# Patient Record
Sex: Male | Born: 1935 | Race: White | Hispanic: No | Marital: Married | Smoking: Never smoker
Health system: Southern US, Community
[De-identification: ages and names within clinical notes are randomized; demographics above are authoritative.]

## PROBLEM LIST (undated history)

## (undated) DIAGNOSIS — E349 Endocrine disorder, unspecified: Secondary | ICD-10-CM

## (undated) DIAGNOSIS — I495 Sick sinus syndrome: Secondary | ICD-10-CM

## (undated) DIAGNOSIS — I2510 Atherosclerotic heart disease of native coronary artery without angina pectoris: Secondary | ICD-10-CM

## (undated) DIAGNOSIS — I10 Essential (primary) hypertension: Secondary | ICD-10-CM

## (undated) DIAGNOSIS — I480 Paroxysmal atrial fibrillation: Secondary | ICD-10-CM

## (undated) DIAGNOSIS — N529 Male erectile dysfunction, unspecified: Secondary | ICD-10-CM

## (undated) DIAGNOSIS — M1990 Unspecified osteoarthritis, unspecified site: Secondary | ICD-10-CM

## (undated) DIAGNOSIS — E785 Hyperlipidemia, unspecified: Secondary | ICD-10-CM

## (undated) DIAGNOSIS — M545 Low back pain: Secondary | ICD-10-CM

## (undated) DIAGNOSIS — N200 Calculus of kidney: Secondary | ICD-10-CM

## (undated) DIAGNOSIS — Z8711 Personal history of peptic ulcer disease: Secondary | ICD-10-CM

## (undated) DIAGNOSIS — Z8719 Personal history of other diseases of the digestive system: Secondary | ICD-10-CM

## (undated) DIAGNOSIS — N400 Enlarged prostate without lower urinary tract symptoms: Secondary | ICD-10-CM

## (undated) DIAGNOSIS — G8929 Other chronic pain: Secondary | ICD-10-CM

## (undated) DIAGNOSIS — E669 Obesity, unspecified: Secondary | ICD-10-CM

## (undated) DIAGNOSIS — I4891 Unspecified atrial fibrillation: Secondary | ICD-10-CM

## (undated) DIAGNOSIS — K5730 Diverticulosis of large intestine without perforation or abscess without bleeding: Secondary | ICD-10-CM

## (undated) HISTORY — DX: Paroxysmal atrial fibrillation: I48.0

## (undated) HISTORY — DX: Endocrine disorder, unspecified: E34.9

## (undated) HISTORY — DX: Sick sinus syndrome: I49.5

## (undated) HISTORY — DX: Atherosclerotic heart disease of native coronary artery without angina pectoris: I25.10

## (undated) HISTORY — DX: Hyperlipidemia, unspecified: E78.5

## (undated) HISTORY — PX: CHOLECYSTECTOMY: SHX55

## (undated) HISTORY — PX: CATARACT EXTRACTION, BILATERAL: SHX1313

## (undated) HISTORY — DX: Obesity, unspecified: E66.9

## (undated) HISTORY — DX: Unspecified osteoarthritis, unspecified site: M19.90

## (undated) HISTORY — DX: Personal history of other diseases of the digestive system: Z87.19

## (undated) HISTORY — DX: Diverticulosis of large intestine without perforation or abscess without bleeding: K57.30

## (undated) HISTORY — DX: Personal history of peptic ulcer disease: Z87.11

## (undated) HISTORY — PX: OTHER SURGICAL HISTORY: SHX169

## (undated) HISTORY — DX: Male erectile dysfunction, unspecified: N52.9

## (undated) HISTORY — DX: Essential (primary) hypertension: I10

## (undated) HISTORY — PX: CARDIAC CATHETERIZATION: SHX172

## (undated) HISTORY — DX: Benign prostatic hyperplasia without lower urinary tract symptoms: N40.0

---

## 1997-04-15 HISTORY — PX: OTHER SURGICAL HISTORY: SHX169

## 1997-04-15 HISTORY — PX: COLONOSCOPY: SHX174

## 1997-10-19 ENCOUNTER — Encounter: Payer: Self-pay | Admitting: Gastroenterology

## 1997-10-19 ENCOUNTER — Inpatient Hospital Stay (HOSPITAL_COMMUNITY): Admission: EM | Admit: 1997-10-19 | Discharge: 1997-10-21 | Payer: Self-pay | Admitting: Gastroenterology

## 1997-10-20 ENCOUNTER — Encounter: Payer: Self-pay | Admitting: Gastroenterology

## 1997-10-21 ENCOUNTER — Encounter: Payer: Self-pay | Admitting: Internal Medicine

## 1997-10-21 ENCOUNTER — Encounter: Payer: Self-pay | Admitting: Gastroenterology

## 2003-09-05 ENCOUNTER — Encounter: Payer: Self-pay | Admitting: Internal Medicine

## 2005-02-01 ENCOUNTER — Ambulatory Visit: Payer: Self-pay | Admitting: Internal Medicine

## 2005-07-29 ENCOUNTER — Ambulatory Visit: Payer: Self-pay | Admitting: Internal Medicine

## 2005-08-05 ENCOUNTER — Ambulatory Visit: Payer: Self-pay | Admitting: Internal Medicine

## 2005-11-04 ENCOUNTER — Ambulatory Visit: Payer: Self-pay | Admitting: Internal Medicine

## 2006-04-15 HISTORY — PX: INSERT / REPLACE / REMOVE PACEMAKER: SUR710

## 2006-08-18 ENCOUNTER — Ambulatory Visit: Payer: Self-pay | Admitting: Internal Medicine

## 2006-11-11 DIAGNOSIS — M1990 Unspecified osteoarthritis, unspecified site: Secondary | ICD-10-CM | POA: Insufficient documentation

## 2006-11-11 DIAGNOSIS — N400 Enlarged prostate without lower urinary tract symptoms: Secondary | ICD-10-CM

## 2006-11-11 DIAGNOSIS — K279 Peptic ulcer, site unspecified, unspecified as acute or chronic, without hemorrhage or perforation: Secondary | ICD-10-CM | POA: Insufficient documentation

## 2006-11-11 DIAGNOSIS — I10 Essential (primary) hypertension: Secondary | ICD-10-CM | POA: Insufficient documentation

## 2006-11-11 DIAGNOSIS — K5730 Diverticulosis of large intestine without perforation or abscess without bleeding: Secondary | ICD-10-CM | POA: Insufficient documentation

## 2006-11-11 DIAGNOSIS — Z87442 Personal history of urinary calculi: Secondary | ICD-10-CM

## 2006-11-24 ENCOUNTER — Ambulatory Visit: Payer: Self-pay | Admitting: Internal Medicine

## 2006-11-24 DIAGNOSIS — R634 Abnormal weight loss: Secondary | ICD-10-CM | POA: Insufficient documentation

## 2006-11-24 DIAGNOSIS — K759 Inflammatory liver disease, unspecified: Secondary | ICD-10-CM | POA: Insufficient documentation

## 2006-11-24 LAB — CONVERTED CEMR LAB
Basophils Relative: 0.1 % (ref 0.0–1.0)
Bilirubin, Direct: 0.2 mg/dL (ref 0.0–0.3)
CO2: 29 meq/L (ref 19–32)
Creatinine, Ser: 1.5 mg/dL (ref 0.4–1.5)
HCT: 38.8 % — ABNORMAL LOW (ref 39.0–52.0)
Hemoglobin: 13.3 g/dL (ref 13.0–17.0)
Lymphocytes Relative: 12.1 % (ref 12.0–46.0)
MCHC: 34.2 g/dL (ref 30.0–36.0)
Monocytes Absolute: 1.5 10*3/uL — ABNORMAL HIGH (ref 0.2–0.7)
Neutrophils Relative %: 66.6 % (ref 43.0–77.0)
Potassium: 3.1 meq/L — ABNORMAL LOW (ref 3.5–5.1)
RDW: 13.1 % (ref 11.5–14.6)
Sodium: 137 meq/L (ref 135–145)
TSH: 0.83 microintl units/mL (ref 0.35–5.50)
Total Bilirubin: 0.9 mg/dL (ref 0.3–1.2)
Total Protein: 7.3 g/dL (ref 6.0–8.3)

## 2006-11-27 ENCOUNTER — Ambulatory Visit: Payer: Self-pay | Admitting: Internal Medicine

## 2006-11-28 ENCOUNTER — Ambulatory Visit: Payer: Self-pay | Admitting: Internal Medicine

## 2006-11-28 ENCOUNTER — Encounter: Payer: Self-pay | Admitting: Internal Medicine

## 2006-11-28 ENCOUNTER — Ambulatory Visit: Payer: Self-pay | Admitting: Cardiology

## 2006-11-28 ENCOUNTER — Inpatient Hospital Stay (HOSPITAL_COMMUNITY): Admission: AD | Admit: 2006-11-28 | Discharge: 2006-12-04 | Payer: Self-pay | Admitting: Internal Medicine

## 2006-11-29 ENCOUNTER — Encounter (INDEPENDENT_AMBULATORY_CARE_PROVIDER_SITE_OTHER): Payer: Self-pay | Admitting: General Surgery

## 2006-11-29 ENCOUNTER — Encounter (INDEPENDENT_AMBULATORY_CARE_PROVIDER_SITE_OTHER): Payer: Self-pay | Admitting: *Deleted

## 2006-12-01 ENCOUNTER — Encounter: Payer: Self-pay | Admitting: Internal Medicine

## 2006-12-02 ENCOUNTER — Encounter: Payer: Self-pay | Admitting: Internal Medicine

## 2006-12-08 ENCOUNTER — Ambulatory Visit: Payer: Self-pay | Admitting: Cardiology

## 2006-12-10 ENCOUNTER — Ambulatory Visit: Payer: Self-pay | Admitting: Internal Medicine

## 2006-12-16 ENCOUNTER — Ambulatory Visit: Payer: Self-pay

## 2006-12-16 ENCOUNTER — Ambulatory Visit: Payer: Self-pay | Admitting: Internal Medicine

## 2006-12-22 ENCOUNTER — Ambulatory Visit: Payer: Self-pay | Admitting: Cardiology

## 2006-12-22 ENCOUNTER — Ambulatory Visit: Payer: Self-pay | Admitting: Internal Medicine

## 2006-12-22 LAB — CONVERTED CEMR LAB
BUN: 14 mg/dL (ref 6–23)
Basophils Absolute: 0 10*3/uL (ref 0.0–0.1)
Creatinine, Ser: 1.2 mg/dL (ref 0.4–1.5)
Hemoglobin: 13.3 g/dL (ref 13.0–17.0)
INR: 1.2 — ABNORMAL HIGH (ref 0.8–1.0)
MCHC: 33.7 g/dL (ref 30.0–36.0)
Monocytes Absolute: 0.7 10*3/uL (ref 0.2–0.7)
Monocytes Relative: 11.3 % — ABNORMAL HIGH (ref 3.0–11.0)
Potassium: 3.8 meq/L (ref 3.5–5.1)
RBC: 4.1 M/uL — ABNORMAL LOW (ref 4.22–5.81)
RDW: 13.4 % (ref 11.5–14.6)
aPTT: 24.9 s (ref 21.7–29.8)

## 2006-12-23 ENCOUNTER — Ambulatory Visit: Payer: Self-pay | Admitting: Internal Medicine

## 2006-12-25 ENCOUNTER — Ambulatory Visit: Payer: Self-pay | Admitting: Internal Medicine

## 2006-12-25 ENCOUNTER — Inpatient Hospital Stay (HOSPITAL_BASED_OUTPATIENT_CLINIC_OR_DEPARTMENT_OTHER): Admission: RE | Admit: 2006-12-25 | Discharge: 2006-12-25 | Payer: Self-pay | Admitting: Internal Medicine

## 2006-12-29 ENCOUNTER — Encounter: Payer: Self-pay | Admitting: Internal Medicine

## 2006-12-29 ENCOUNTER — Ambulatory Visit: Payer: Self-pay | Admitting: Internal Medicine

## 2007-01-01 ENCOUNTER — Ambulatory Visit: Payer: Self-pay | Admitting: Internal Medicine

## 2007-01-05 ENCOUNTER — Ambulatory Visit: Payer: Self-pay | Admitting: Internal Medicine

## 2007-01-14 ENCOUNTER — Ambulatory Visit: Payer: Self-pay | Admitting: Cardiology

## 2007-01-28 ENCOUNTER — Ambulatory Visit: Payer: Self-pay | Admitting: Cardiovascular Disease

## 2007-02-05 ENCOUNTER — Inpatient Hospital Stay (HOSPITAL_COMMUNITY): Admission: EM | Admit: 2007-02-05 | Discharge: 2007-02-06 | Payer: Self-pay | Admitting: Emergency Medicine

## 2007-02-05 ENCOUNTER — Ambulatory Visit: Payer: Self-pay | Admitting: Internal Medicine

## 2007-02-10 ENCOUNTER — Ambulatory Visit: Payer: Self-pay | Admitting: Internal Medicine

## 2007-02-10 ENCOUNTER — Ambulatory Visit: Admission: RE | Admit: 2007-02-10 | Discharge: 2007-02-10 | Payer: Self-pay | Admitting: Internal Medicine

## 2007-02-12 ENCOUNTER — Observation Stay (HOSPITAL_COMMUNITY): Admission: RE | Admit: 2007-02-12 | Discharge: 2007-02-13 | Payer: Self-pay | Admitting: Internal Medicine

## 2007-02-12 ENCOUNTER — Ambulatory Visit: Payer: Self-pay | Admitting: Cardiology

## 2007-02-13 ENCOUNTER — Encounter: Payer: Self-pay | Admitting: Internal Medicine

## 2007-02-18 ENCOUNTER — Encounter: Payer: Self-pay | Admitting: Internal Medicine

## 2007-02-18 ENCOUNTER — Ambulatory Visit: Payer: Self-pay

## 2007-02-25 ENCOUNTER — Ambulatory Visit: Payer: Self-pay

## 2007-02-25 ENCOUNTER — Ambulatory Visit: Payer: Self-pay | Admitting: Cardiology

## 2007-03-17 ENCOUNTER — Ambulatory Visit: Payer: Self-pay | Admitting: Cardiology

## 2007-03-17 ENCOUNTER — Ambulatory Visit: Payer: Self-pay | Admitting: Internal Medicine

## 2007-03-31 ENCOUNTER — Ambulatory Visit: Payer: Self-pay | Admitting: Internal Medicine

## 2007-03-31 LAB — CONVERTED CEMR LAB
ALT: 16 units/L (ref 0–53)
AST: 27 units/L (ref 0–37)
Bilirubin, Direct: 0.1 mg/dL (ref 0.0–0.3)
CO2: 24 meq/L (ref 19–32)
Calcium: 9.3 mg/dL (ref 8.4–10.5)
Chloride: 108 meq/L (ref 96–112)
Cholesterol: 125 mg/dL (ref 0–200)
Creatinine, Ser: 1.3 mg/dL (ref 0.4–1.5)
GFR calc Af Amer: 69 mL/min
Glucose, Bld: 95 mg/dL (ref 70–99)
Total Bilirubin: 0.9 mg/dL (ref 0.3–1.2)
Total CHOL/HDL Ratio: 2.5
Total Protein: 7 g/dL (ref 6.0–8.3)

## 2007-04-13 ENCOUNTER — Ambulatory Visit: Payer: Self-pay | Admitting: Cardiology

## 2007-05-04 ENCOUNTER — Ambulatory Visit: Payer: Self-pay | Admitting: Cardiovascular Disease

## 2007-06-09 ENCOUNTER — Ambulatory Visit: Payer: Self-pay | Admitting: Cardiology

## 2007-06-09 ENCOUNTER — Ambulatory Visit: Payer: Self-pay | Admitting: Internal Medicine

## 2007-06-29 ENCOUNTER — Ambulatory Visit: Payer: Self-pay | Admitting: Internal Medicine

## 2007-06-29 DIAGNOSIS — I2510 Atherosclerotic heart disease of native coronary artery without angina pectoris: Secondary | ICD-10-CM | POA: Insufficient documentation

## 2007-06-29 DIAGNOSIS — N529 Male erectile dysfunction, unspecified: Secondary | ICD-10-CM | POA: Insufficient documentation

## 2007-07-07 ENCOUNTER — Ambulatory Visit: Payer: Self-pay | Admitting: Cardiovascular Disease

## 2007-07-17 DIAGNOSIS — E785 Hyperlipidemia, unspecified: Secondary | ICD-10-CM

## 2007-07-17 DIAGNOSIS — I4819 Other persistent atrial fibrillation: Secondary | ICD-10-CM | POA: Insufficient documentation

## 2007-07-30 ENCOUNTER — Ambulatory Visit: Payer: Self-pay | Admitting: Internal Medicine

## 2007-08-04 ENCOUNTER — Ambulatory Visit: Payer: Self-pay | Admitting: Cardiovascular Disease

## 2007-09-03 ENCOUNTER — Ambulatory Visit: Payer: Self-pay | Admitting: Internal Medicine

## 2007-10-01 ENCOUNTER — Ambulatory Visit: Payer: Self-pay | Admitting: Cardiology

## 2007-10-29 ENCOUNTER — Ambulatory Visit: Payer: Self-pay | Admitting: Cardiovascular Disease

## 2007-11-19 ENCOUNTER — Ambulatory Visit: Payer: Self-pay | Admitting: Internal Medicine

## 2007-11-26 ENCOUNTER — Ambulatory Visit: Payer: Self-pay | Admitting: Internal Medicine

## 2007-12-31 ENCOUNTER — Ambulatory Visit: Payer: Self-pay | Admitting: Cardiology

## 2008-02-02 ENCOUNTER — Ambulatory Visit: Payer: Self-pay | Admitting: Internal Medicine

## 2008-02-02 ENCOUNTER — Ambulatory Visit: Payer: Self-pay | Admitting: Cardiology

## 2008-03-01 ENCOUNTER — Ambulatory Visit: Payer: Self-pay | Admitting: Cardiovascular Disease

## 2008-03-07 ENCOUNTER — Ambulatory Visit: Payer: Self-pay | Admitting: Internal Medicine

## 2008-03-07 DIAGNOSIS — F528 Other sexual dysfunction not due to a substance or known physiological condition: Secondary | ICD-10-CM

## 2008-03-15 ENCOUNTER — Ambulatory Visit: Payer: Self-pay | Admitting: Internal Medicine

## 2008-03-15 ENCOUNTER — Ambulatory Visit: Payer: Self-pay | Admitting: Cardiology

## 2008-04-19 ENCOUNTER — Ambulatory Visit: Payer: Self-pay | Admitting: Internal Medicine

## 2008-05-03 ENCOUNTER — Ambulatory Visit: Payer: Self-pay | Admitting: Cardiology

## 2008-06-03 ENCOUNTER — Encounter: Payer: Self-pay | Admitting: Internal Medicine

## 2008-06-03 ENCOUNTER — Ambulatory Visit: Payer: Self-pay | Admitting: Internal Medicine

## 2008-06-03 DIAGNOSIS — M79609 Pain in unspecified limb: Secondary | ICD-10-CM

## 2008-06-03 DIAGNOSIS — R0989 Other specified symptoms and signs involving the circulatory and respiratory systems: Secondary | ICD-10-CM

## 2008-06-03 DIAGNOSIS — R0602 Shortness of breath: Secondary | ICD-10-CM

## 2008-06-08 ENCOUNTER — Ambulatory Visit: Payer: Self-pay

## 2008-06-08 ENCOUNTER — Encounter: Payer: Self-pay | Admitting: Internal Medicine

## 2008-06-15 ENCOUNTER — Ambulatory Visit: Payer: Self-pay | Admitting: Internal Medicine

## 2008-06-24 ENCOUNTER — Ambulatory Visit: Payer: Self-pay | Admitting: Cardiovascular Disease

## 2008-07-22 ENCOUNTER — Ambulatory Visit: Payer: Self-pay | Admitting: Cardiology

## 2008-08-04 ENCOUNTER — Encounter (INDEPENDENT_AMBULATORY_CARE_PROVIDER_SITE_OTHER): Payer: Self-pay | Admitting: *Deleted

## 2008-08-19 ENCOUNTER — Ambulatory Visit: Payer: Self-pay | Admitting: Cardiovascular Disease

## 2008-09-06 ENCOUNTER — Ambulatory Visit: Payer: Self-pay | Admitting: Internal Medicine

## 2008-09-06 LAB — CONVERTED CEMR LAB
ALT: 21 units/L (ref 0–53)
Alkaline Phosphatase: 49 units/L (ref 39–117)
Basophils Relative: 0.1 % (ref 0.0–3.0)
Bilirubin, Direct: 0.1 mg/dL (ref 0.0–0.3)
Calcium: 9.2 mg/dL (ref 8.4–10.5)
Creatinine, Ser: 1.3 mg/dL (ref 0.4–1.5)
Eosinophils Relative: 2.6 % (ref 0.0–5.0)
GFR calc non Af Amer: 56.97 mL/min (ref >60)
Lymphocytes Relative: 18 % (ref 12.0–46.0)
Neutrophils Relative %: 68.6 % (ref 43.0–77.0)
PSA: 2.26 ng/mL (ref 0.10–4.00)
Platelets: 206 10*3/uL (ref 150.0–400.0)
RBC: 3.98 M/uL — ABNORMAL LOW (ref 4.22–5.81)
Total Bilirubin: 0.7 mg/dL (ref 0.3–1.2)
Total Protein: 7 g/dL (ref 6.0–8.3)
WBC: 7.1 10*3/uL (ref 4.5–10.5)

## 2008-09-09 ENCOUNTER — Ambulatory Visit: Payer: Self-pay | Admitting: Internal Medicine

## 2008-09-09 LAB — CONVERTED CEMR LAB: POC INR: 2.9

## 2008-09-13 ENCOUNTER — Encounter: Payer: Self-pay | Admitting: *Deleted

## 2008-09-14 ENCOUNTER — Ambulatory Visit: Payer: Self-pay | Admitting: Internal Medicine

## 2008-10-07 ENCOUNTER — Ambulatory Visit: Payer: Self-pay | Admitting: Cardiology

## 2008-10-07 LAB — CONVERTED CEMR LAB
POC INR: 2.9
Prothrombin Time: 20.5 s

## 2008-10-19 ENCOUNTER — Encounter: Payer: Self-pay | Admitting: *Deleted

## 2008-11-03 ENCOUNTER — Ambulatory Visit: Payer: Self-pay | Admitting: Cardiology

## 2008-12-01 ENCOUNTER — Ambulatory Visit: Payer: Self-pay | Admitting: Cardiology

## 2008-12-01 LAB — CONVERTED CEMR LAB: POC INR: 3.2

## 2008-12-14 ENCOUNTER — Ambulatory Visit: Payer: Self-pay | Admitting: Internal Medicine

## 2008-12-22 ENCOUNTER — Ambulatory Visit: Payer: Self-pay | Admitting: Cardiovascular Disease

## 2009-01-05 ENCOUNTER — Ambulatory Visit: Payer: Self-pay | Admitting: Internal Medicine

## 2009-01-19 ENCOUNTER — Ambulatory Visit: Payer: Self-pay | Admitting: Internal Medicine

## 2009-01-19 LAB — CONVERTED CEMR LAB: POC INR: 2.6

## 2009-02-08 ENCOUNTER — Ambulatory Visit: Payer: Self-pay | Admitting: Cardiology

## 2009-02-08 ENCOUNTER — Encounter: Payer: Self-pay | Admitting: Internal Medicine

## 2009-02-08 ENCOUNTER — Ambulatory Visit: Payer: Self-pay | Admitting: Internal Medicine

## 2009-02-08 DIAGNOSIS — Z950 Presence of cardiac pacemaker: Secondary | ICD-10-CM | POA: Insufficient documentation

## 2009-02-08 LAB — CONVERTED CEMR LAB: POC INR: 2.5

## 2009-02-20 ENCOUNTER — Ambulatory Visit: Payer: Self-pay | Admitting: Internal Medicine

## 2009-02-21 ENCOUNTER — Encounter: Payer: Self-pay | Admitting: Internal Medicine

## 2009-02-21 ENCOUNTER — Telehealth (INDEPENDENT_AMBULATORY_CARE_PROVIDER_SITE_OTHER): Payer: Self-pay

## 2009-03-08 ENCOUNTER — Ambulatory Visit: Payer: Self-pay | Admitting: Cardiology

## 2009-03-08 ENCOUNTER — Ambulatory Visit (HOSPITAL_COMMUNITY): Admission: RE | Admit: 2009-03-08 | Discharge: 2009-03-08 | Payer: Self-pay | Admitting: Internal Medicine

## 2009-03-08 ENCOUNTER — Ambulatory Visit: Payer: Self-pay

## 2009-03-08 ENCOUNTER — Encounter: Payer: Self-pay | Admitting: Internal Medicine

## 2009-03-08 ENCOUNTER — Ambulatory Visit: Payer: Self-pay | Admitting: Internal Medicine

## 2009-03-08 LAB — CONVERTED CEMR LAB: POC INR: 2.7

## 2009-03-23 ENCOUNTER — Ambulatory Visit: Payer: Self-pay | Admitting: Internal Medicine

## 2009-03-23 DIAGNOSIS — R5381 Other malaise: Secondary | ICD-10-CM

## 2009-03-23 DIAGNOSIS — R5383 Other fatigue: Secondary | ICD-10-CM

## 2009-04-05 ENCOUNTER — Ambulatory Visit: Payer: Self-pay | Admitting: Internal Medicine

## 2009-04-05 DIAGNOSIS — I2510 Atherosclerotic heart disease of native coronary artery without angina pectoris: Secondary | ICD-10-CM

## 2009-04-05 LAB — CONVERTED CEMR LAB: POC INR: 2.5

## 2009-04-06 ENCOUNTER — Encounter: Payer: Self-pay | Admitting: Internal Medicine

## 2009-04-06 LAB — CONVERTED CEMR LAB
ALT: 18 units/L (ref 0–53)
AST: 29 units/L (ref 0–37)
Albumin: 3.9 g/dL (ref 3.5–5.2)
Alkaline Phosphatase: 51 units/L (ref 39–117)
Basophils Absolute: 0 10*3/uL (ref 0.0–0.1)
CO2: 26 meq/L (ref 19–32)
Calcium: 9.2 mg/dL (ref 8.4–10.5)
Cholesterol: 139 mg/dL (ref 0–200)
Creatinine, Ser: 1.4 mg/dL (ref 0.4–1.5)
Eosinophils Relative: 1.7 % (ref 0.0–5.0)
GFR calc non Af Amer: 52.22 mL/min (ref >60)
HCT: 44.7 % (ref 39.0–52.0)
HDL: 43.1 mg/dL (ref >39.00)
Lymphocytes Relative: 14.4 % (ref 12.0–46.0)
Lymphs Abs: 1.2 10*3/uL (ref 0.7–4.0)
Monocytes Relative: 10.2 % (ref 3.0–12.0)
Neutrophils Relative %: 73.3 % (ref 43.0–77.0)
Platelets: 236 10*3/uL (ref 150.0–400.0)
RDW: 13.1 % (ref 11.5–14.6)
Sodium: 139 meq/L (ref 135–145)
TSH: 1.12 microintl units/mL (ref 0.35–5.50)
Total CHOL/HDL Ratio: 3
Total Protein: 7 g/dL (ref 6.0–8.3)
Triglycerides: 148 mg/dL (ref 0.0–149.0)
WBC: 8 10*3/uL (ref 4.5–10.5)

## 2009-05-03 ENCOUNTER — Ambulatory Visit: Payer: Self-pay | Admitting: Cardiovascular Disease

## 2009-05-03 LAB — CONVERTED CEMR LAB: POC INR: 2.8

## 2009-05-31 ENCOUNTER — Ambulatory Visit: Payer: Self-pay | Admitting: Internal Medicine

## 2009-06-13 ENCOUNTER — Ambulatory Visit: Payer: Self-pay | Admitting: Internal Medicine

## 2009-06-28 ENCOUNTER — Ambulatory Visit: Payer: Self-pay | Admitting: Internal Medicine

## 2009-06-28 LAB — CONVERTED CEMR LAB: POC INR: 3

## 2009-07-05 ENCOUNTER — Ambulatory Visit: Payer: Self-pay | Admitting: Radiology

## 2009-07-05 ENCOUNTER — Ambulatory Visit: Payer: Self-pay | Admitting: Cardiology

## 2009-07-05 ENCOUNTER — Encounter: Payer: Self-pay | Admitting: Emergency Medicine

## 2009-07-05 ENCOUNTER — Inpatient Hospital Stay (HOSPITAL_COMMUNITY): Admission: AD | Admit: 2009-07-05 | Discharge: 2009-07-09 | Payer: Self-pay | Admitting: Cardiology

## 2009-07-17 ENCOUNTER — Encounter: Payer: Self-pay | Admitting: Internal Medicine

## 2009-07-18 ENCOUNTER — Ambulatory Visit: Payer: Self-pay | Admitting: Internal Medicine

## 2009-07-18 LAB — CONVERTED CEMR LAB: POC INR: 3.4

## 2009-08-01 ENCOUNTER — Ambulatory Visit: Payer: Self-pay | Admitting: Cardiovascular Disease

## 2009-08-09 ENCOUNTER — Ambulatory Visit: Payer: Self-pay | Admitting: Internal Medicine

## 2009-08-09 DIAGNOSIS — I498 Other specified cardiac arrhythmias: Secondary | ICD-10-CM

## 2009-08-22 ENCOUNTER — Ambulatory Visit: Payer: Self-pay | Admitting: Internal Medicine

## 2009-08-23 ENCOUNTER — Ambulatory Visit: Payer: Self-pay | Admitting: Internal Medicine

## 2009-09-12 ENCOUNTER — Ambulatory Visit: Payer: Self-pay | Admitting: Internal Medicine

## 2009-09-13 ENCOUNTER — Encounter: Payer: Self-pay | Admitting: Internal Medicine

## 2009-09-13 ENCOUNTER — Ambulatory Visit: Payer: Self-pay | Admitting: Cardiology

## 2009-09-13 LAB — CONVERTED CEMR LAB: POC INR: 2

## 2009-09-26 ENCOUNTER — Telehealth: Payer: Self-pay

## 2009-10-11 ENCOUNTER — Ambulatory Visit: Payer: Self-pay | Admitting: Cardiology

## 2009-10-13 IMAGING — CR DG CHEST 2V
2 series · 2 of 2 positions shown · non-contrast
Comparison: Preoperative study 02/05/07.

CLINICAL DATA: 76 year old male status post pacemaker placement.
 CHEST - 2 VIEW - 02/13/07:

[w chest pa]
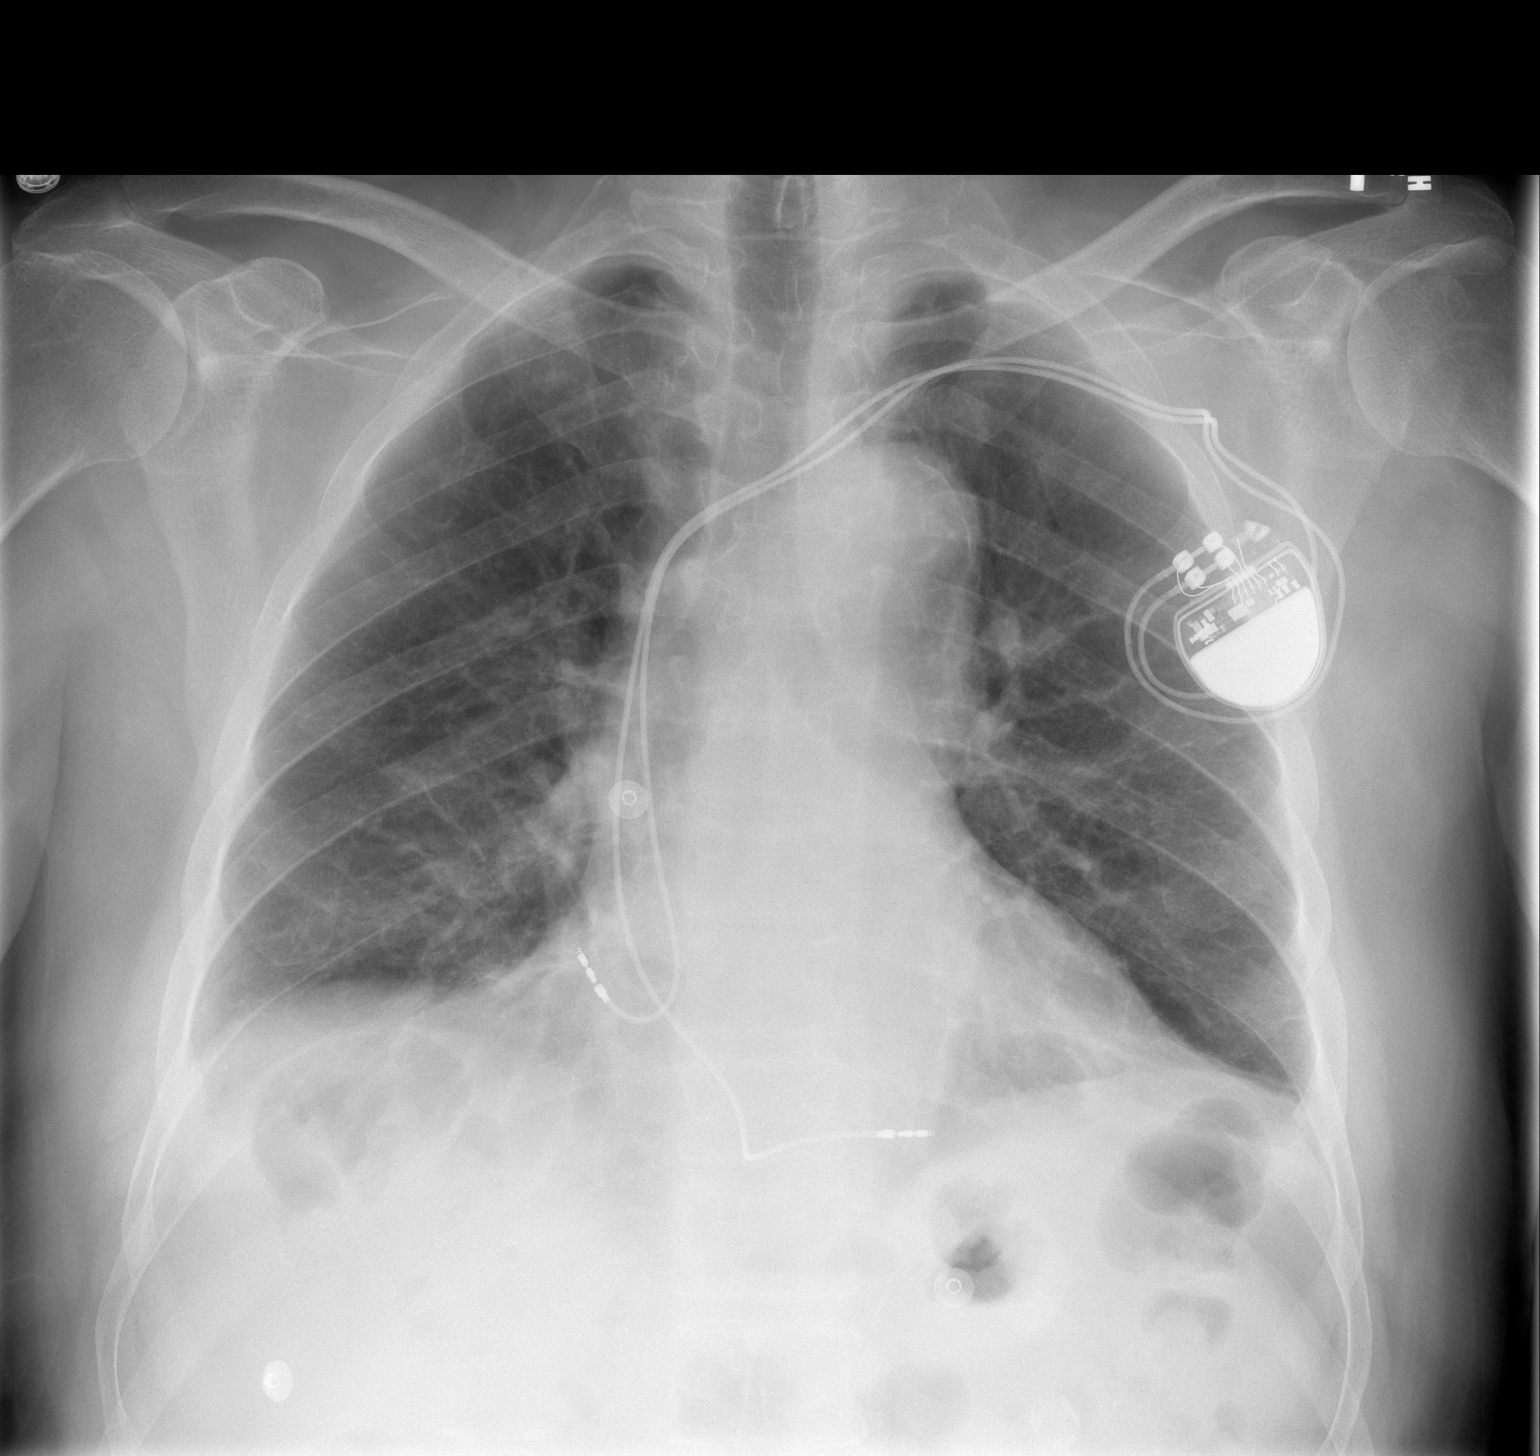

[w chest lat]
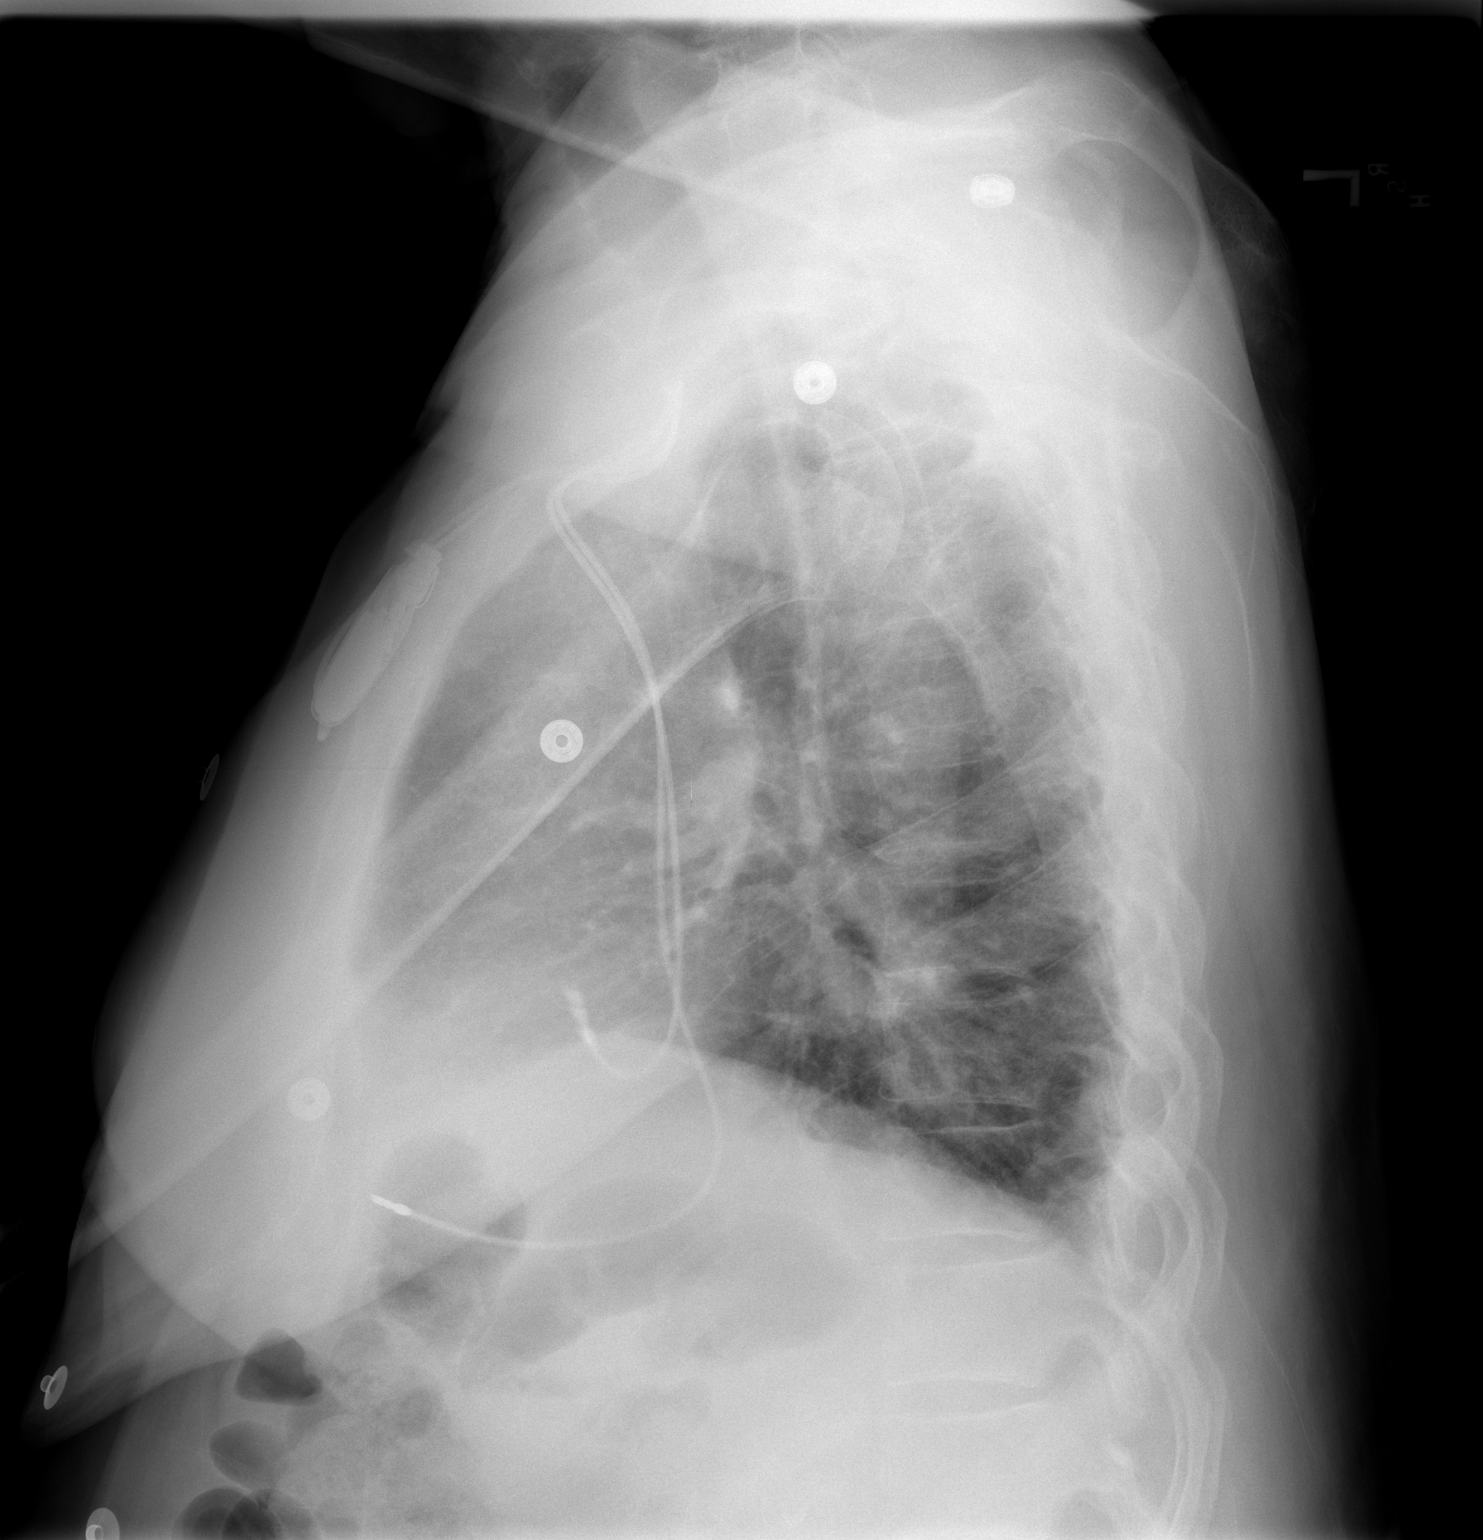

[2 of 2 positions shown; findings below may reference images not displayed]

FINDINGS: New dual-lead transvenous left-sided cardiac pacemaker device.  Slight kinking of the leads along the lateral aspect of the left chest, otherwise no adverse features.  The leads terminate in the region of the right atrium and right ventricle.  Stable cardiac size and mediastinal contour.  Slightly shallow lung inflation with basilar atelectasis.  No pneumothorax, pulmonary edema, or pleural effusion.  No acute osseous abnormality seen.
IMPRESSION: 1.  New left chest cardiac pacemaker, no adverse features.
 2.  Shallow lung inflation with atelectasis.

## 2009-11-01 ENCOUNTER — Ambulatory Visit: Payer: Self-pay | Admitting: Cardiology

## 2009-11-29 ENCOUNTER — Ambulatory Visit: Payer: Self-pay | Admitting: Cardiology

## 2009-11-29 LAB — CONVERTED CEMR LAB: POC INR: 2.7

## 2009-12-12 ENCOUNTER — Ambulatory Visit: Payer: Self-pay | Admitting: Internal Medicine

## 2009-12-27 ENCOUNTER — Ambulatory Visit: Payer: Self-pay | Admitting: Internal Medicine

## 2009-12-27 LAB — CONVERTED CEMR LAB: POC INR: 2.4

## 2010-01-24 ENCOUNTER — Ambulatory Visit: Payer: Self-pay | Admitting: Cardiology

## 2010-01-24 LAB — CONVERTED CEMR LAB: POC INR: 2.3

## 2010-01-30 ENCOUNTER — Ambulatory Visit: Payer: Self-pay | Admitting: Internal Medicine

## 2010-02-15 ENCOUNTER — Ambulatory Visit: Payer: Self-pay | Admitting: Internal Medicine

## 2010-02-15 ENCOUNTER — Encounter: Payer: Self-pay | Admitting: Physician Assistant

## 2010-02-15 DIAGNOSIS — I5032 Chronic diastolic (congestive) heart failure: Secondary | ICD-10-CM

## 2010-02-20 ENCOUNTER — Encounter: Payer: Self-pay | Admitting: Gastroenterology

## 2010-02-20 ENCOUNTER — Ambulatory Visit: Payer: Self-pay | Admitting: Internal Medicine

## 2010-02-20 DIAGNOSIS — R1310 Dysphagia, unspecified: Secondary | ICD-10-CM | POA: Insufficient documentation

## 2010-02-21 ENCOUNTER — Ambulatory Visit: Payer: Self-pay | Admitting: Cardiovascular Disease

## 2010-02-21 LAB — CONVERTED CEMR LAB: POC INR: 2.7

## 2010-02-23 LAB — CONVERTED CEMR LAB
AST: 30 units/L (ref 0–37)
HDL: 42.2 mg/dL (ref >39.00)
Total CHOL/HDL Ratio: 3
Triglycerides: 143 mg/dL (ref 0.0–149.0)

## 2010-02-26 ENCOUNTER — Telehealth: Payer: Self-pay | Admitting: Internal Medicine

## 2010-03-13 ENCOUNTER — Ambulatory Visit: Payer: Self-pay | Admitting: Internal Medicine

## 2010-03-21 ENCOUNTER — Ambulatory Visit: Payer: Self-pay | Admitting: Internal Medicine

## 2010-03-21 LAB — CONVERTED CEMR LAB: POC INR: 2.8

## 2010-04-04 ENCOUNTER — Ambulatory Visit: Payer: Self-pay | Admitting: Gastroenterology

## 2010-04-04 DIAGNOSIS — K219 Gastro-esophageal reflux disease without esophagitis: Secondary | ICD-10-CM | POA: Insufficient documentation

## 2010-04-18 ENCOUNTER — Ambulatory Visit: Admission: RE | Admit: 2010-04-18 | Discharge: 2010-04-18 | Payer: Self-pay | Source: Home / Self Care

## 2010-04-18 LAB — CONVERTED CEMR LAB: POC INR: 2.8

## 2010-05-08 ENCOUNTER — Encounter: Payer: Self-pay | Admitting: Gastroenterology

## 2010-05-08 ENCOUNTER — Ambulatory Visit
Admission: RE | Admit: 2010-05-08 | Discharge: 2010-05-08 | Payer: Self-pay | Source: Home / Self Care | Attending: Gastroenterology | Admitting: Gastroenterology

## 2010-05-09 ENCOUNTER — Other Ambulatory Visit: Payer: Self-pay | Admitting: Gastroenterology

## 2010-05-09 DIAGNOSIS — K2940 Chronic atrophic gastritis without bleeding: Secondary | ICD-10-CM

## 2010-05-13 LAB — CONVERTED CEMR LAB
ALT: 22 units/L (ref 0–40)
Albumin: 3.6 g/dL (ref 3.5–5.2)
Basophils Absolute: 0 10*3/uL (ref 0.0–0.1)
Bilirubin, Direct: 0.1 mg/dL (ref 0.0–0.3)
Calcium: 9.5 mg/dL (ref 8.4–10.5)
Eosinophils Absolute: 0.3 10*3/uL (ref 0.0–0.6)
Eosinophils Relative: 3.4 % (ref 0.0–5.0)
GFR calc Af Amer: 64 mL/min
GFR calc non Af Amer: 53 mL/min
Glucose, Bld: 101 mg/dL — ABNORMAL HIGH (ref 70–99)
Lymphocytes Relative: 13 % (ref 12.0–46.0)
MCHC: 34.4 g/dL (ref 30.0–36.0)
MCV: 94.3 fL (ref 78.0–100.0)
Monocytes Relative: 13.2 % — ABNORMAL HIGH (ref 3.0–11.0)
Neutro Abs: 5.3 10*3/uL (ref 1.4–7.7)
PSA: 1.09 ng/mL (ref 0.10–4.00)
Platelets: 275 10*3/uL (ref 150–400)
WBC: 7.6 10*3/uL (ref 4.5–10.5)

## 2010-05-15 ENCOUNTER — Encounter: Payer: Self-pay | Admitting: Gastroenterology

## 2010-05-15 NOTE — Medication Information (Signed)
Summary: rov/tm  Anticoagulant Therapy  Managed by: Leota Sauers, PharmD, BCPS, CPP Referring MD: Arvilla Meres PCP: Gordy Savers  MD Supervising MD: Ladona Ridgel MD, Sharlot Gowda Indication 1: Atrial Fibrillation (ICD-427.31) Lab Used: [facility] Site: [facility] INR POC 2.8 INR RANGE 2 - 3  Dietary changes: no    Health status changes: no    Bleeding/hemorrhagic complications: no    Recent/future hospitalizations: no    Any changes in medication regimen? no    Recent/future dental: no  Any missed doses?: no       Is patient compliant with meds? yes       Current Medications (verified): 1)  Aspirin 81 Mg Tbec (Aspirin) .... Take 1 Tablet By Mouth Every Morning 2)  Warfarin Sodium 5 Mg  Tabs (Warfarin Sodium) .... Take As Directed By Coumadin Clinic. 3)  Simvastatin 20 Mg  Tabs (Simvastatin) .Marland Kitchen.. 1 Once Daily 4)  Terazosin Hcl 10 Mg Caps (Terazosin Hcl) .... Take 1 Capsule By Mouth Once A Day 5)  Multivitamins  Tabs (Multiple Vitamin) .... Take 1 Tablet By Mouth Once A Day 6)  Vitamin C-Acerola 500 Mg Chew (Ascorbic Acid) .... Take 1 Tablet By Mouth Once A Day 7)  Glucosamine-Chondroitin 500-400 Mg Caps (Glucosamine-Chondroitin) .... Take 1 Tablet By Mouth Two Times A Day 8)  Tylenol Arthritis Pain 650 Mg Cr-Tabs (Acetaminophen) .... As Needed 9)  Androgel 50 Mg/5gm Gel (Testosterone) .... Use Q Am 10)  Sotalol Hcl 80 Mg Tabs (Sotalol Hcl) .... Take One Tablet By Mouth Twice A Day 11)  Potassium Chloride Crys Cr 20 Meq Cr-Tabs (Potassium Chloride Crys Cr) .... Take One Tablet By Mouth Daily 12)  Nitrostat 0.4 Mg Subl (Nitroglycerin) .Marland Kitchen.. 1 Tablet Under Tongue At Onset of Chest Pain; You May Repeat Every 5 Minutes For Up To 3 Doses. 13)  Furosemide 20 Mg Tabs (Furosemide) .... Take One Tablet By Mouth Daily. 14)  Amlodipine Besylate 10 Mg Tabs (Amlodipine Besylate) .... Take One Tablet By Mouth Daily  Allergies (verified): 1)  ! Sulfa  Anticoagulation Management  History:      The patient is taking warfarin and comes in today for a routine follow up visit.  Positive risk factors for bleeding include an age of 75 years or older.  The bleeding index is 'intermediate risk'.  Positive CHADS2 values include History of CHF, History of HTN, and Age > 24 years old.  The start date was 12/02/2006.  His last INR was 1.2 RATIO.  Anticoagulation responsible provider: Ladona Ridgel MD, Sharlot Gowda.  INR POC: 2.8.  Cuvette Lot#: [id number].  Exp: 03/2011.    Anticoagulation Management Assessment/Plan:      The patient's current anticoagulation dose is Warfarin sodium 5 mg  tabs: Take as directed by coumadin clinic..  The target INR is 2.0-3.0.  The next INR is due 04/18/2010.  Anticoagulation instructions were given to patient.  Results were reviewed/authorized by Leota Sauers, PharmD, BCPS, CPP.         Prior Anticoagulation Instructions: INR 2.7 Continue 2.5mg s daily except 5mg s on Sundays and Wednesdays. Recheck in 4 weeks.   Current Anticoagulation Instructions: INR 2.8  Coumadin 5mg  tab - take 1 tab SUN and WED, 1/2 tab all other days

## 2010-05-15 NOTE — Medication Information (Signed)
Summary: rov/kb  Anticoagulant Therapy  Managed by: Weston Brass, PharmD Referring MD: Arvilla Meres PCP: Gordy Savers  MD Supervising MD: Myrtis Ser MD, Tinnie Gens Indication 1: Atrial Fibrillation (ICD-427.31) Lab Used: [facility] Site: [facility] INR POC 2.0 INR RANGE 2 - 3  Dietary changes: no    Health status changes: no    Bleeding/hemorrhagic complications: no    Recent/future hospitalizations: no    Any changes in medication regimen? no    Recent/future dental: no  Any missed doses?: no       Is patient compliant with meds? yes       Allergies: 1)  ! Sulfa  Anticoagulation Management History:      The patient is taking warfarin and comes in today for a routine follow up visit.  Positive risk factors for bleeding include an age of 75 years or older.  The bleeding index is 'intermediate risk'.  Positive CHADS2 values include History of HTN and Age > 7 years old.  The start date was 12/02/2006.  His last INR was 1.2 RATIO.  Anticoagulation responsible provider: Myrtis Ser MD, Tinnie Gens.  INR POC: 2.0.  Cuvette Lot#: [id number].  Exp: 11/2010.    Anticoagulation Management Assessment/Plan:      The patient's current anticoagulation dose is Warfarin sodium 5 mg  tabs: Take as directed by coumadin clinic..  The target INR is 2.0-3.0.  The next INR is due 10/11/2009.  Anticoagulation instructions were given to patient.  Results were reviewed/authorized by Weston Brass, PharmD.  He was notified by Weston Brass PharmD.         Prior Anticoagulation Instructions: INR-1.9 Take 1.5 tablets today only. Continue on the normal schedule. Take 1 tablet on Wednesdays and 1/2 a tablet on all other days of the week. Return in 3 weeks  Current Anticoagulation Instructions: INR 2.0  Continue same dose of 1/2 tablet every day except 1 tablet on Wednesday.

## 2010-05-15 NOTE — Assessment & Plan Note (Signed)
Summary: EMP-WILL FAST//CCM   Vital Signs:  Patient profile:   77 year old male Height:      69 inches Weight:      219 pounds BMI:     32.46 Temp:     98.0 degrees F oral BP sitting:   140 / 70  (right arm) Cuff size:   regular  Vitals Entered By: Duard Brady LPN (Aug 22, 2009 8:41 AM) CC: cpx - doing ok  Is Patient Diabetic? No   Primary Care Provider:  Gordy Savers  MD  CC:  cpx - doing ok .  History of Present Illness: 77 year old patient who is seen today for a comprehensive evaluation.  He was hospitalized two months ago for paroxysmal atrial fibrillation  and diastolic heart failure.  He has coronary artery disease.  Mild renal insufficiency.  Since his discharge he has done quite well.  He has treated hypertension and is on chronic Coumadin anticoagulation. Here for Medicare AWV:  1.   Risk factors based on Past M, S, F history:  patient has known coronary artery disease.  Cardiac risk factors include an hypertension, dyslipidemia. 2.   Physical Activities: remains quite active with outdoor activities 3.   Depression/mood: no history of depression 4.   Hearing: no deficits 5.   ADL's: totally independent in all aspects of daily living 6.   Fall Risk: low 7.   Home Safety: no problems identified 8.   Height, weight, &visual acuity:  height and weight stable.  He states it is glasses, probably need adjusting will see eye doctor later this spring 9.   Counseling: heart healthy diet.  Discussed 10.   Labs ordered based on risk factors: recent laboratory studies were reviewed from his inpatient hospital admission 11.           Referral Coordination- manner appropriate 12.           Care Plan- continue active lifestyle, exercise regimen, and heart healthy diet.  He will continue cardiology follow-up 13.            Cognitive Assessment- alert and oriented, with normal affect and mood   Preventive Screening-Counseling & Management  Alcohol-Tobacco     Smoking  Status: never  Allergies: 1)  ! Sulfa  Past History:  Past Medical History:  1. Paroxysmal atrial fibrillation with tachybrady syndrome.       --s/p [employer]ia pacemaker      --stqarted sotalol 3/11  2. One vessel coronary artery disease with normal ejection fraction.       --Cath 9/08. EF normal. LM nl, LAD 40%, D2 occulded with collaterals, LCX 40%, RCA 30%      --Echo 11/10 EF 50-55% grade I diastolic dysfunction.   3. History of gallstones status post cholecystectomy.   4. Hypertension.   5. Hyperlipidemia.   6. History of peptic ulcer disease.   7. Osteoarthritis  8. Benign prostatic hypertrophy  9. Obesity 10.Nephrolithiasis, hx of 11. Diverticulosis, colon 12.  Testosterone deficiency  ED  Past Surgical History: Reviewed history from 09/06/2008 and no changes required.  Cholecystectomy cardiac catheterization colonoscopy 1999 upper gastroduodenoscopy, 1999 status post pacemaker carotid artery.   Doppler evaluation  Family History: Reviewed history from 06/02/2008 and no changes required. father died at 43 of an MI.  Mother died at 41 of hypertension, coronary disease, and palpitations of diabetes two brothers, positive for diabetes, hypertension, coronary artery disease  Social History: Reviewed history from 06/02/2008 and no changes required.  He lives with his wife in [place], [place].  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   No tobacco or alcohol use.   Review of Systems  The patient denies anorexia, fever, weight loss, weight gain, vision loss, decreased hearing, hoarseness, chest pain, syncope, dyspnea on exertion, peripheral edema, prolonged cough, headaches, hemoptysis, abdominal pain, melena, hematochezia, severe indigestion/heartburn, hematuria, incontinence, genital sores, muscle weakness, suspicious skin lesions, transient blindness, difficulty walking, depression, unusual weight change, abnormal  bleeding, enlarged lymph nodes, angioedema, breast masses, and testicular masses.    Physical Exam  General:  overweight-appearing.  nblood pressureoverweight-appearing.   Head:  Normocephalic and atraumatic without obvious abnormalities. No apparent alopecia or balding. Eyes:  No corneal or conjunctival inflammation noted. EOMI. Perrla. Funduscopic exam benign, without hemorrhages, exudates or papilledema. Vision grossly normal. Ears:  External ear exam shows no significant lesions or deformities.  Otoscopic examination reveals clear canals, tympanic membranes are intact bilaterally without bulging, retraction, inflammation or discharge. Hearing is grossly normal bilaterally. Mouth:  Oral mucosa and oropharynx without lesions or exudates.  dentures in place Neck:  No deformities, masses, or tenderness noted. Chest Wall:  No deformities, masses, tenderness or gynecomastia noted. Breasts:  No masses or gynecomastia noted Lungs:  Normal respiratory effort, chest expands symmetrically. Lungs are clear to auscultation, no crackles or wheezes. Heart:  Normal rate and regular rhythm. S1 and S2 normal without gallop, murmur, click, rub or other extra sounds. Abdomen:  Bowel sounds positive,abdomen soft and non-tender without masses, organomegaly or hernias noted. Rectal:  No external abnormalities noted. Normal sphincter tone. No rectal masses or tenderness. Genitalia:  Testes bilaterally descended without nodularity, tenderness or masses. No scrotal masses or lesions. No penis lesions or urethral discharge. slight testicular atrophy Prostate:  2+ enlarged.  2+ enlarged.   Msk:  No deformity or scoliosis noted of thoracic or lumbar spine.   Pulses:  R and L carotid,radial,femoral,dorsalis pedis and posterior tibial pulses are full and equal bilaterally Extremities:  No clubbing, cyanosis, edema, or deformity noted with normal full range of motion of all joints.   Neurologic:  No cranial nerve deficits  noted. Station and gait are normal. Plantar reflexes are down-going bilaterally. DTRs are symmetrical throughout. Sensory, motor and coordinative functions appear intact. Skin:  Intact without suspicious lesions or rashes Cervical Nodes:  No lymphadenopathy noted Axillary Nodes:  No palpable lymphadenopathy Inguinal Nodes:  No significant adenopathy Psych:  Cognition and judgment appear intact. Alert and cooperative with normal attention span and concentration. No apparent delusions, illusions, hallucinations   Impression & Recommendations:  Problem # 1:  CORONARY ATHEROSCLEROSIS NATIVE CORONARY ARTERY (ICD-414.01)  His updated medication list for this problem includes:    Aspirin 81 Mg Tbec (Aspirin) .Marland Kitchen... Take 1 tablet by mouth every morning    Terazosin Hcl 10 Mg Caps (Terazosin hcl) .Marland Kitchen... Take 1 capsule by mouth once a day    Sotalol Hcl 80 Mg Tabs (Sotalol hcl) .Marland Kitchen... Take one tablet by mouth twice a day    Nitrostat 0.4 Mg Subl (Nitroglycerin) .Marland Kitchen... 1 tablet under tongue at onset of chest pain; you may repeat every 5 minutes for up to 3 doses.    Furosemide 20 Mg Tabs (Furosemide) .Marland Kitchen... Take one tablet by mouth daily.    Amlodipine Besylate 10 Mg Tabs (Amlodipine besylate) .Marland Kitchen... Take one tablet by mouth daily  His updated medication list for this problem includes:    Aspirin 81  Mg Tbec (Aspirin) .Marland Kitchen... Take 1 tablet by mouth every morning    Terazosin Hcl 10 Mg Caps (Terazosin hcl) .Marland Kitchen... Take 1 capsule by mouth once a day    Sotalol Hcl 80 Mg Tabs (Sotalol hcl) .Marland Kitchen... Take one tablet by mouth twice a day    Nitrostat 0.4 Mg Subl (Nitroglycerin) .Marland Kitchen... 1 tablet under tongue at onset of chest pain; you may repeat every 5 minutes for up to 3 doses.    Furosemide 20 Mg Tabs (Furosemide) .Marland Kitchen... Take one tablet by mouth daily.    Amlodipine Besylate 10 Mg Tabs (Amlodipine besylate) .Marland Kitchen... Take one tablet by mouth daily  Orders: Prescription Created Electronically [phone])  Problem # 2:   ATRIAL FIBRILLATION (ICD-427.31)  His updated medication list for this problem includes:    Aspirin 81 Mg Tbec (Aspirin) .Marland Kitchen... Take 1 tablet by mouth every morning    Warfarin Sodium 5 Mg Tabs (Warfarin sodium) .Marland Kitchen... Take as directed by coumadin clinic.    Sotalol Hcl 80 Mg Tabs (Sotalol hcl) .Marland Kitchen... Take one tablet by mouth twice a day    Amlodipine Besylate 10 Mg Tabs (Amlodipine besylate) .Marland Kitchen... Take one tablet by mouth daily  His updated medication list for this problem includes:    Aspirin 81 Mg Tbec (Aspirin) .Marland Kitchen... Take 1 tablet by mouth every morning    Warfarin Sodium 5 Mg Tabs (Warfarin sodium) .Marland Kitchen... Take as directed by coumadin clinic.    Sotalol Hcl 80 Mg Tabs (Sotalol hcl) .Marland Kitchen... Take one tablet by mouth twice a day    Amlodipine Besylate 10 Mg Tabs (Amlodipine besylate) .Marland Kitchen... Take one tablet by mouth daily  Problem # 3:  HYPERTENSION (ICD-401.9)  His updated medication list for this problem includes:    Terazosin Hcl 10 Mg Caps (Terazosin hcl) .Marland Kitchen... Take 1 capsule by mouth once a day    Sotalol Hcl 80 Mg Tabs (Sotalol hcl) .Marland Kitchen... Take one tablet by mouth twice a day    Furosemide 20 Mg Tabs (Furosemide) .Marland Kitchen... Take one tablet by mouth daily.    Amlodipine Besylate 10 Mg Tabs (Amlodipine besylate) .Marland Kitchen... Take one tablet by mouth daily  His updated medication list for this problem includes:    Terazosin Hcl 10 Mg Caps (Terazosin hcl) .Marland Kitchen... Take 1 capsule by mouth once a day    Sotalol Hcl 80 Mg Tabs (Sotalol hcl) .Marland Kitchen... Take one tablet by mouth twice a day    Furosemide 20 Mg Tabs (Furosemide) .Marland Kitchen... Take one tablet by mouth daily.    Amlodipine Besylate 10 Mg Tabs (Amlodipine besylate) .Marland Kitchen... Take one tablet by mouth daily  Complete Medication List: 1)  Aspirin 81 Mg Tbec (Aspirin) .... Take 1 tablet by mouth every morning 2)  Warfarin Sodium 5 Mg Tabs (Warfarin sodium) .... Take as directed by coumadin clinic. 3)  Simvastatin 20 Mg Tabs (Simvastatin) .Marland Kitchen.. 1 once daily 4)  Terazosin  Hcl 10 Mg Caps (Terazosin hcl) .... Take 1 capsule by mouth once a day 5)  Multivitamins Tabs (Multiple vitamin) .... Take 1 tablet by mouth once a day 6)  Vitamin C-acerola 500 Mg Chew (Ascorbic acid) .... Take 1 tablet by mouth once a day 7)  Glucosamine-chondroitin 500-400 Mg Caps (Glucosamine-chondroitin) .... Take 1 tablet by mouth two times a day 8)  Tylenol Arthritis Pain 650 Mg Cr-tabs (Acetaminophen) .... As needed 9)  Androgel 50 Mg/5gm Gel (Testosterone) .... Use q am 10)  Sotalol Hcl 80 Mg Tabs (Sotalol hcl) .... Take one tablet by mouth twice  a day 11)  Potassium Chloride Crys Cr 20 Meq Cr-tabs (Potassium chloride crys cr) .... Take one tablet by mouth daily 12)  Nitrostat 0.4 Mg Subl (Nitroglycerin) .Marland Kitchen.. 1 tablet under tongue at onset of chest pain; you may repeat every 5 minutes for up to 3 doses. 13)  Furosemide 20 Mg Tabs (Furosemide) .... Take one tablet by mouth daily. 14)  Amlodipine Besylate 10 Mg Tabs (Amlodipine besylate) .... Take one tablet by mouth daily  Other Orders: First annual wellness visit with prevention plan  ([id number])  Patient Instructions: 1)  Please schedule a follow-up appointment in 6 months. 2)  Limit your Sodium (Salt). 3)  It is important that you exercise regularly at least 20 minutes 5 times a week. If you develop chest pain, have severe difficulty breathing, or feel very tired , stop exercising immediately and seek medical attention. 4)  You need to lose weight. Consider a lower calorie diet and regular exercise.  Prescriptions: AMLODIPINE BESYLATE 10 MG TABS (AMLODIPINE BESYLATE) Take one tablet by mouth daily  #90 x 6   Entered and Authorized by:   Gordy Savers  MD   Signed by:   Gordy Savers  MD on 08/22/2009   Method used:   Electronically to        [facility] (retail)       [address]       [place], [place]  [address]       Ph: [phone]       Fax: [phone]   RxID:   [id number] FUROSEMIDE 20 MG  TABS (FUROSEMIDE) Take one tablet by mouth daily.  #90 x 6   Entered and Authorized by:   Gordy Savers  MD   Signed by:   Gordy Savers  MD on 08/22/2009   Method used:   Electronically to        [facility] (retail)       [address]       [place], [place]  [address]       Ph: [phone]       Fax: [phone]   RxID:   [id number] TERAZOSIN HCL 10 MG CAPS (TERAZOSIN HCL) Take 1 capsule by mouth once a day  #90 x 6   Entered and Authorized by:   Gordy Savers  MD   Signed by:   Gordy Savers  MD on 08/22/2009   Method used:   Electronically to        [facility] (retail)       [address]       [place], [place]  [address]       Ph: [phone]       Fax: [phone]   RxID:   [id number] SIMVASTATIN 20 MG  TABS (SIMVASTATIN) 1 once daily  #90 x 6   Entered and Authorized by:   Gordy Savers  MD   Signed by:   Gordy Savers  MD on 08/22/2009   Method used:   Electronically to        [facility] (retail)       [address]       [place], [place]  [address]       Ph: [phone]       Fax: [phone]   RxID:   [id number] AMLODIPINE BESYLATE 10 MG TABS (AMLODIPINE BESYLATE) Take one  tablet by mouth daily  #90 x 6   Entered and Authorized by:   Gordy Savers  MD   Signed by:   Gordy Savers  MD on 08/22/2009   Method used:   Print then Give to Patient   RxID:   [id number] FUROSEMIDE 20 MG TABS (FUROSEMIDE) Take one tablet by mouth daily.  #90 x 6   Entered and Authorized by:   Gordy Savers  MD   Signed by:   Gordy Savers  MD on 08/22/2009   Method used:   Print then Give to Patient   RxID:   [id number] POTASSIUM CHLORIDE CRYS CR 20 MEQ CR-TABS (POTASSIUM CHLORIDE CRYS CR) Take one tablet by mouth daily  #90 x 6   Entered and Authorized by:   Gordy Savers  MD   Signed by:   Gordy Savers  MD on 08/22/2009   Method used:    Print then Give to Patient   RxID:   [id number] ANDROGEL 50 MG/5GM GEL (TESTOSTERONE) use q am  #90 x 6   Entered and Authorized by:   Gordy Savers  MD   Signed by:   Gordy Savers  MD on 08/22/2009   Method used:   Print then Give to Patient   RxID:   [id number] TERAZOSIN HCL 10 MG CAPS (TERAZOSIN HCL) Take 1 capsule by mouth once a day  #90 x 6   Entered and Authorized by:   Gordy Savers  MD   Signed by:   Gordy Savers  MD on 08/22/2009   Method used:   Print then Give to Patient   RxID:   [id number] SIMVASTATIN 20 MG  TABS (SIMVASTATIN) 1 once daily  #90 x 6   Entered and Authorized by:   Gordy Savers  MD   Signed by:   Gordy Savers  MD on 08/22/2009   Method used:   Print then Give to Patient   RxID:   [id number]

## 2010-05-15 NOTE — Assessment & Plan Note (Signed)
Summary: device/sf  Medications Added SOTALOL HCL 80 MG TABS (SOTALOL HCL) Take one tablet by mouth twice a day POTASSIUM CHLORIDE CRYS CR 20 MEQ CR-TABS (POTASSIUM CHLORIDE CRYS CR) Take one tablet by mouth daily NITROSTAT 0.4 MG SUBL (NITROGLYCERIN) 1 tablet under tongue at onset of chest pain; you may repeat every 5 minutes for up to 3 doses. FUROSEMIDE 20 MG TABS (FUROSEMIDE) Take one tablet by mouth daily. DILTIAZEM HCL ER BEADS 240 MG XR24H-CAP (DILTIAZEM HCL ER BEADS) Take one tablet by mouth once daily.      Allergies Added:   Primary Provider:  Gordy Savers  MD   History of Present Illness: Francisco Merritt returns today for followup.  [name], [name], and HTN.  He has done fairly well since his PPM.  No syncope or c/p or sob.  Current Medications (verified): 1)  Aspirin 81 Mg Tbec (Aspirin) .... Take 1 Tablet By Mouth Every Morning 2)  Warfarin Sodium 5 Mg  Tabs (Warfarin Sodium) .... Take As Directed By Coumadin Clinic. 3)  Simvastatin 20 Mg  Tabs (Simvastatin) .Marland Kitchen.. 1 Once Daily 4)  Metoprolol Tartrate 50 Mg  Tabs (Metoprolol Tartrate) .Marland Kitchen.. 1 Twice Daily 5)  Terazosin Hcl 10 Mg Caps (Terazosin Hcl) .... Take 1 Capsule By Mouth Once A Day 6)  Multivitamins  Tabs (Multiple Vitamin) .... Take 1 Tablet By Mouth Once A Day 7)  Vitamin C-Acerola 500 Mg Chew (Ascorbic Acid) .... Take 1 Tablet By Mouth Once A Day 8)  Glucosamine-Chondroitin 500-400 Mg Caps (Glucosamine-Chondroitin) .... Take 1 Tablet By Mouth Two Times A Day 9)  Tylenol Arthritis Pain 650 Mg Cr-Tabs (Acetaminophen) .... As Needed 10)  Androgel 50 Mg/5gm Gel (Testosterone) .... Use Q Am 11)  Sotalol Hcl 80 Mg Tabs (Sotalol Hcl) .... Take One Tablet By Mouth Twice A Day 12)  Potassium Chloride Crys Cr 20 Meq Cr-Tabs (Potassium Chloride Crys Cr) .... Take One Tablet By Mouth Daily 13)  Nitrostat 0.4 Mg Subl (Nitroglycerin) .Marland Kitchen.. 1 Tablet Under Tongue At Onset of  Chest Pain; You May Repeat Every 5 Minutes For Up To 3 Doses. 14)  Furosemide 20 Mg Tabs (Furosemide) .... Take One Tablet By Mouth Daily. 15)  Diltiazem Hcl Er Beads 240 Mg Xr24h-Cap (Diltiazem Hcl Er Beads) .... Take One Tablet By Mouth Once Daily.  Allergies (verified): 1)  ! Sulfa  Past History:  Past Medical History: Last updated: 03/23/2009  1. Paroxysmal atrial fibrillation with tachybrady syndrome.       --s/p Guidant Insignia pacemaker  2. One vessel coronary artery disease with normal ejection fraction.       --Cath 9/08. EF normal. LM nl, LAD 40%, D2 occulded with collaterals, LCX 40%, RCA 30%      --Echo 11/10 EF 50-55% grade I diastolic dysfunction.   3. History of gallstones status post cholecystectomy.   4. Hypertension.   5. Hyperlipidemia.   6. History of peptic ulcer disease.   7. Osteoarthritis  8. Benign prostatic hypertrophy  9. Obesity 10.Nephrolithiasis, hx of 11. Diverticulosis, colon  ED  Past Surgical History: Last updated: 09/06/2008  Cholecystectomy cardiac catheterization colonoscopy 1999 upper gastroduodenoscopy, 1999 status post pacemaker carotid artery.   Doppler evaluation  Review of Systems  The patient denies chest pain, syncope, dyspnea on exertion, and peripheral edema.    Vital Signs:  Patient profile:   77 year old male Height:      69 inches Weight:  223 pounds BMI:     33.05 Pulse rate:   61 / minute BP sitting:   130 / 80  (left arm)  Vitals Entered By: Francisco Merritt CMA (July 18, 2009 12:20 PM)  Physical Exam  General:  Gen: well appearing. no resp difficulty HEENT: normal. airway Mallanpatti 3 Neck: supple. no JVD. Carotids 2+ bilat; no bruits.  Cor: PMI nondisplaced. Regular rate & rhythm. No rubs, gallops, murmur. Lungs: clear Abdomen: obese. soft, nontender, nondistended. No bruits or masses. Good bowel sounds. Extremities: no cyanosis, clubbing, rash, edema Neuro: alert & orientedx3, cranial nerves  grossly intact. moves all 4 extremities w/o difficulty. affect pleasant    PPM Specifications Following MD:  Francisco Bunting, MD     PPM Vendor:  GUIDANT     PPM Model Number:  [id number]     PPM Serial Number:  [id number] PPM DOI:  02/12/2007     PPM Implanting MD:  Francisco Bunting, MD  Lead 1    Location: RA     DOI: 02/12/2007     Model #: [id number]     Serial #: [id number]     Status: active Lead 2    Location: RV     DOI: 02/12/2007     Model #: [id number]     Serial #: [id number]     Status: active  Magnet Response Rate:  BOL 100 ERI 85  Indications:  Tachy brady syndrome; A-fib   PPM Follow Up Remote Check?  No Battery Voltage:  good V     Battery Est. Longevity:  >5 years     Pacer Dependent:  No       PPM Device Measurements Atrium  Amplitude: 1.5 mV, Impedance: 550 ohms, Threshold: 1.0 V at 0.4 msec Right Ventricle  Amplitude: 12 mV, Impedance: 720 ohms, Threshold: 0.9 V at 0.4 msec  Episodes MS Episodes:  169     Percent Mode Switch:  12%     Coumadin:  Yes Atrial Pacing:  42%     Ventricular Pacing:  20%  Parameters Mode:  DDDR     Lower Rate Limit:  60     Upper Rate Limit:  120 Paced AV Delay:  300     Sensed AV Delay:  260 Tech Comments:  Longest mode switch 19 hours since 07/05/09, + coumadin.  [name] was discharged from [facility] 07/09/09 for A-fib with RVR.  No parameter changes. Francisco Harm, LPN  July 18, 1605 12:25 PM  MD Comments:  Agree with above.  Impression & Recommendations:  Problem # 1:  CARDIAC PACEMAKER IN SITU (ICD-V45.01) His device is working normally.  He will continue to followup in several months.  Problem # 2:  ATRIAL FIBRILLATION (ICD-427.31) He is maintaining NSR about 90% of the time.  He will continue the sotalol His updated medication list for this problem includes:    Aspirin 81 Mg Tbec (Aspirin) .Marland Kitchen... Take 1 tablet by mouth every morning    Warfarin Sodium 5 Mg Tabs (Warfarin sodium) .Marland Kitchen... Take as directed by coumadin clinic.    Metoprolol Tartrate 50 Mg  Tabs (Metoprolol tartrate) .Marland Kitchen... 1 twice daily    Sotalol Hcl 80 Mg Tabs (Sotalol hcl) .Marland Kitchen... Take one tablet by mouth twice a day  Problem # 3:  HYPERTENSION (ICD-401.9) A low sodium diet and continued medical therapy is recommended. His updated medication list for this problem includes:    Aspirin 81 Mg Tbec (Aspirin) .Marland Kitchen... Take 1 tablet by mouth every  morning    Metoprolol Tartrate 50 Mg Tabs (Metoprolol tartrate) .Marland Kitchen... 1 twice daily    Terazosin Hcl 10 Mg Caps (Terazosin hcl) .Marland Kitchen... Take 1 capsule by mouth once a day    Sotalol Hcl 80 Mg Tabs (Sotalol hcl) .Marland Kitchen... Take one tablet by mouth twice a day    Furosemide 20 Mg Tabs (Furosemide) .Marland Kitchen... Take one tablet by mouth daily.    Diltiazem Hcl Er Beads 240 Mg Xr24h-cap (Diltiazem hcl er beads) .Marland Kitchen... Take one tablet by mouth once daily.  Patient Instructions: 1)  Your physician recommends that you schedule a follow-up appointment in: 6 months with Dr Ladona Ridgel

## 2010-05-15 NOTE — Medication Information (Signed)
Summary: rov/eac  Anticoagulant Therapy  Managed by: Eda Keys, PharmD Referring MD: Arvilla Meres PCP: Gordy Savers  MD Supervising MD: Ladona Ridgel MD, Sharlot Gowda Indication 1: Atrial Fibrillation (ICD-427.31) Lab Used: [facility] Site: [facility] INR POC 1.9 INR RANGE 2 - 3  Dietary changes: no    Health status changes: no    Bleeding/hemorrhagic complications: no    Recent/future hospitalizations: no    Any changes in medication regimen? no    Recent/future dental: no  Any missed doses?: no       Is patient compliant with meds? yes       Allergies: 1)  ! Sulfa  Anticoagulation Management History:      The patient is taking warfarin and comes in today for a routine follow up visit.  Positive risk factors for bleeding include an age of 75 years or older.  The bleeding index is 'intermediate risk'.  Positive CHADS2 values include History of HTN and Age > 75 years old.  The start date was 12/02/2006.  His last INR was 1.2 RATIO.  Anticoagulation responsible provider: Ladona Ridgel MD, Sharlot Gowda.  INR POC: 1.9.  Cuvette Lot#: [id number].  Exp: 09/2010.    Anticoagulation Management Assessment/Plan:      The patient's current anticoagulation dose is Warfarin sodium 5 mg  tabs: Take as directed by coumadin clinic..  The target INR is 2.0-3.0.  The next INR is due 09/13/2009.  Anticoagulation instructions were given to patient.  Results were reviewed/authorized by Eda Keys, PharmD.  He was notified by [facility].         Prior Anticoagulation Instructions: INR 2.0  Take 1 tablet today, then resume same dosage 1/2 tablet daily except 1 tablet on Wednesdays.  Recheck in 3 weeks.    Current Anticoagulation Instructions: INR-1.9 Take 1.5 tablets today only. Continue on the normal schedule. Take 1 tablet on Wednesdays and 1/2 a tablet on all other days of the week. Return in 3 weeks

## 2010-05-15 NOTE — Miscellaneous (Signed)
Summary: lopressor refill   Clinical Lists Changes  Medications: Rx of LOPRESSOR 50 MG TABS (METOPROLOL TARTRATE) 1 by mouth bid;  #90 x 3;  Signed;  Entered by: Duard Brady LPN;  Authorized by: Gordy Savers  MD;  Method used: Faxed to [facility] [address] [place], [place], [place]  [address], Ph: [phone], Fax: [phone]    Prescriptions: LOPRESSOR 50 MG TABS (METOPROLOL TARTRATE) 1 by mouth bid  #90 x 3   Entered by:   Duard Brady LPN   Authorized by:   Gordy Savers  MD   Signed by:   Duard Brady LPN on 13/11/6576   Method used:   Faxed to ...       [facility] (retail)       [address]       [place], [place]  [address]       Ph: [phone]       Fax: [phone]   RxID:   [id number]

## 2010-05-15 NOTE — Medication Information (Signed)
Summary: rov/tm  Anticoagulant Therapy  Managed by: Lew Dawes, PharmD Candidate Referring MD: Arvilla Meres PCP: Gordy Savers  MD Supervising MD: Eden Emms MD, Theron Arista Indication 1: Atrial Fibrillation (ICD-427.31) Lab Used: [facility] Site: [facility] INR POC 2.8 INR RANGE 2 - 3  Dietary changes: no    Health status changes: no    Bleeding/hemorrhagic complications: no    Recent/future hospitalizations: no    Any changes in medication regimen? no    Recent/future dental: no  Any missed doses?: no       Is patient compliant with meds? yes       Allergies (verified): 1)  ! Sulfa  Anticoagulation Management History:      The patient is taking warfarin and comes in today for a routine follow up visit.  Positive risk factors for bleeding include an age of 75 years or older.  The bleeding index is 'intermediate risk'.  Positive CHADS2 values include History of HTN and Age > 75 years old.  The start date was 12/02/2006.  His last INR was 1.2 RATIO.  Anticoagulation responsible provider: Eden Emms MD, Theron Arista.  INR POC: 2.8.  Cuvette Lot#: [id number].  Exp: 07/2010.    Anticoagulation Management Assessment/Plan:      The patient's current anticoagulation dose is Warfarin sodium 5 mg  tabs: Take as directed by coumadin clinic..  The target INR is 2.0-3.0.  The next INR is due 05/31/2009.  Anticoagulation instructions were given to patient.  Results were reviewed/authorized by Lew Dawes, PharmD Candidate.  He was notified by Lew Dawes, PharmD Candidate.         Prior Anticoagulation Instructions: INR 2.5 Continue 2.5mg s daily except 5mg s on Tuesdays and Saturdays. Recheck in 4 weeks.   Current Anticoagulation Instructions: INR 2.8  Continue same dose of 0.5 tablet daily except 1 tablet on Tuesdays and Saturdays. Recheck in 4 weeks.

## 2010-05-15 NOTE — Letter (Signed)
Summary: New Patient letter  [facility]  [address] [place], [place] [address]   Phone: [phone]  Fax: [phone]       02/20/2010 MRN: [id number]  [facility] [address] [place], [place]  [address]  Dear Francisco Merritt,  Welcome to the Gastroenterology Division at [facility].    You are scheduled to see Dr.  Russella Dar on 04-04-10 at 9:30am on the 3rd floor at [facility], [address]  We ask that you try to arrive at our office 15 minutes prior to your appointment time to allow for check-in.  We would like you to complete the enclosed self-administered evaluation form prior to your visit and bring it with you on the day of your appointment.  We will review it with you.  Also, please bring a complete list of all your medications or, if you prefer, bring the medication bottles and we will list them.  Please bring your insurance card so that we may make a copy of it.  If your insurance requires a referral to see a specialist, please bring your referral form from your primary care physician.  Co-payments are due at the time of your visit and may be paid by cash, check or credit card.     Your office visit will consist of a consult with your physician (includes a physical exam), any laboratory testing he/she may order, scheduling of any necessary diagnostic testing (e.g. x-ray, ultrasound, CT-scan), and scheduling of a procedure (e.g. Endoscopy, Colonoscopy) if required.  Please allow enough time on your schedule to allow for any/all of these possibilities.    If you cannot keep your appointment, please call [phone] to cancel or reschedule prior to your appointment date.  This allows [place] the opportunity to schedule an appointment for another patient in need of care.  If you do not cancel or reschedule by 5 p.m. the business day prior to your appointment date, you will be charged a $50.00 late cancellation/no-show fee.    Thank you for choosing [facility] for your medical needs.  We appreciate the opportunity to care for you.  Please visit [place] at our website  to learn more about our practice.                     Sincerely,                                                             The Gastroenterology Division

## 2010-05-15 NOTE — Cardiovascular Report (Signed)
Summary: Office Visit   Office Visit   Imported By: Roderic Ovens 02/01/2010 13:39:18  _____________________________________________________________________  External Attachment:    Type:   Image     Comment:   External Document

## 2010-05-15 NOTE — Medication Information (Signed)
Summary: rov/tm  Anticoagulant Therapy  Managed by: Eda Keys, PharmD Referring MD: Arvilla Meres PCP: Gordy Savers  MD Supervising MD: Shirlee Latch MD, Tashia Leiterman Indication 1: Atrial Fibrillation (ICD-427.31) Lab Used: [facility] Site: [facility] INR POC 2.7 INR RANGE 2 - 3  Dietary changes: no    Health status changes: no    Bleeding/hemorrhagic complications: no    Recent/future hospitalizations: no    Any changes in medication regimen? no    Recent/future dental: no  Any missed doses?: no       Is patient compliant with meds? yes       Allergies: 1)  ! Sulfa  Anticoagulation Management History:      The patient is taking warfarin and comes in today for a routine follow up visit.  Positive risk factors for bleeding include an age of 75 years or older.  The bleeding index is 'intermediate risk'.  Positive CHADS2 values include History of HTN and Age > 51 years old.  The start date was 12/02/2006.  His last INR was 1.2 RATIO.  Anticoagulation responsible provider: Shirlee Latch MD, Suann Klier.  INR POC: 2.7.  Cuvette Lot#: [id number].  Exp: 07/2010.    Anticoagulation Management Assessment/Plan:      The patient's current anticoagulation dose is Warfarin sodium 5 mg  tabs: Take as directed by coumadin clinic..  The target INR is 2.0-3.0.  The next INR is due 06/28/2009.  Anticoagulation instructions were given to patient.  Results were reviewed/authorized by Eda Keys, PharmD.  He was notified by Eda Keys.         Prior Anticoagulation Instructions: INR 2.8  Continue same dose of 0.5 tablet daily except 1 tablet on Tuesdays and Saturdays. Recheck in 4 weeks.  Current Anticoagulation Instructions: INR 2.7  Continue current dosing schedule.  Take 1 tablet (5 mg) on Tuesday and Saturday, and take 1/2 tablet (2.5 mg) all other days. Return to clinic in 4 weeks.

## 2010-05-15 NOTE — Progress Notes (Signed)
Summary: med refill  Phone Note Call from Patient Call back at Home Phone [phone]   Caller: Patient Call For: Gordy Savers  MD Summary of Call: pt needs refill on warfarin 5mg  [phone] Initial call taken by: Heron Sabins,  September 26, 2009 10:05 AM  Follow-up for Phone Call        spoke with pharmacy - rx good .  called pt - wife informed that rx ready. KIK Follow-up by: Duard Brady LPN,  September 26, 2009 12:11 PM

## 2010-05-15 NOTE — Cardiovascular Report (Signed)
Summary: Transtelephonic Pacemaker Monitoring Report  Transtelephonic Pacemaker Monitoring Report   Imported By: Debby Freiberg 10/11/2009 14:20:43  _____________________________________________________________________  External Attachment:    Type:   Image     Comment:   External Document

## 2010-05-15 NOTE — Medication Information (Signed)
Summary: rov/tm   Anticoagulant Therapy  Managed by: Weston Brass, PharmD Referring MD: Arvilla Meres PCP: Gordy Savers  MD Supervising MD: Daleen Squibb MD, Maisie Fus Indication 1: Atrial Fibrillation (ICD-427.31) Lab Used: [facility] Site: [facility] INR POC 2.3 INR RANGE 2 - 3  Dietary changes: no    Health status changes: no    Bleeding/hemorrhagic complications: no    Recent/future hospitalizations: no    Any changes in medication regimen? no    Recent/future dental: no  Any missed doses?: no       Is patient compliant with meds? yes       Allergies: 1)  ! Sulfa  Anticoagulation Management History:      The patient is taking warfarin and comes in today for a routine follow up visit.  Positive risk factors for bleeding include an age of 75 years or older.  The bleeding index is 'intermediate risk'.  Positive CHADS2 values include History of HTN and Age > 75 years old.  The start date was 12/02/2006.  His last INR was 1.2 RATIO.  Anticoagulation responsible provider: Daleen Squibb MD, Maisie Fus.  INR POC: 2.3.  Cuvette Lot#: [id number].  Exp: 02/2011.    Anticoagulation Management Assessment/Plan:      The patient's current anticoagulation dose is Warfarin sodium 5 mg  tabs: Take as directed by coumadin clinic..  The target INR is 2.0-3.0.  The next INR is due 02/21/2010.  Anticoagulation instructions were given to patient.  Results were reviewed/authorized by Weston Brass, PharmD.  He was notified by Ilean Skill D candidate.         Prior Anticoagulation Instructions: INR 2.4 Continue 2.5mg s everyday except 5mg s on Sundays and Wednesdays. Recheck in 4 weeks.   Current Anticoagulation Instructions: INR 2.3  Continue taking 1/2 tablet everyday except 1 tablet on Sunday and Wednesday. Recheck in 4 weeks.

## 2010-05-15 NOTE — Cardiovascular Report (Signed)
Summary: TTM   TTM   Imported By: Roderic Ovens 06/22/2009 16:17:47  _____________________________________________________________________  External Attachment:    Type:   Image     Comment:   External Document

## 2010-05-15 NOTE — Assessment & Plan Note (Signed)
Summary: eph/ gd  Medications Added METOPROLOL TARTRATE 25 MG TABS (METOPROLOL TARTRATE) Take one tablet by mouth twice a day AMLODIPINE BESYLATE 10 MG TABS (AMLODIPINE BESYLATE) Take one tablet by mouth daily      Allergies Added:   Visit Type:  Follow-up Primary Provider:  Gordy Savers  MD  CC:  no cardiac complaints.  History of Present Illness: Francisco Merritt is a delightful 77 her old male with a history of obesity, hypertension, one-vessel coronary artery disease with a totally occluded diagonal branch by catheterization. He also history of atrial fibrillation, complicated by tachybradycardia syndrome and is status post paceaker implantation and takes coumadin.    Admitted last month with recurrent AF with RVR complicated by heart failure. Diuresed and started on sotalol. No recurrent palpitations. Feels fine except for fatgiue which has improved somewhat with testosterone gel. No CP or dyspnea. Working on farm a lot.  No problems with meds. No bleeding with coumadin.  Current Medications (verified): 1)  Aspirin 81 Mg Tbec (Aspirin) .... Take 1 Tablet By Mouth Every Morning 2)  Warfarin Sodium 5 Mg  Tabs (Warfarin Sodium) .... Take As Directed By Coumadin Clinic. 3)  Simvastatin 20 Mg  Tabs (Simvastatin) .Marland Kitchen.. 1 Once Daily 4)  Metoprolol Tartrate 25 Mg Tabs (Metoprolol Tartrate) .... Take One Tablet By Mouth Twice A Day 5)  Terazosin Hcl 10 Mg Caps (Terazosin Hcl) .... Take 1 Capsule By Mouth Once A Day 6)  Multivitamins  Tabs (Multiple Vitamin) .... Take 1 Tablet By Mouth Once A Day 7)  Vitamin C-Acerola 500 Mg Chew (Ascorbic Acid) .... Take 1 Tablet By Mouth Once A Day 8)  Glucosamine-Chondroitin 500-400 Mg Caps (Glucosamine-Chondroitin) .... Take 1 Tablet By Mouth Two Times A Day 9)  Tylenol Arthritis Pain 650 Mg Cr-Tabs (Acetaminophen) .... As Needed 10)  Androgel 50 Mg/5gm Gel (Testosterone) .... Use Q Am 11)  Sotalol Hcl 80 Mg Tabs (Sotalol Hcl) .... Take One Tablet By Mouth  Twice A Day 12)  Potassium Chloride Crys Cr 20 Meq Cr-Tabs (Potassium Chloride Crys Cr) .... Take One Tablet By Mouth Daily 13)  Nitrostat 0.4 Mg Subl (Nitroglycerin) .Marland Kitchen.. 1 Tablet Under Tongue At Onset of Chest Pain; You May Repeat Every 5 Minutes For Up To 3 Doses. 14)  Furosemide 20 Mg Tabs (Furosemide) .... Take One Tablet By Mouth Daily. 15)  Diltiazem Hcl Er Beads 240 Mg Xr24h-Cap (Diltiazem Hcl Er Beads) .... Take One Tablet By Mouth Once Daily.  Allergies (verified): 1)  ! Sulfa  Past History:  Past Medical History:  1. Paroxysmal atrial fibrillation with tachybrady syndrome.       --s/p Guidant Insignia pacemaker      --stqarted sotalol 3/11  2. One vessel coronary artery disease with normal ejection fraction.       --Cath 9/08. EF normal. LM nl, LAD 40%, D2 occulded with collaterals, LCX 40%, RCA 30%      --Echo 11/10 EF 50-55% grade I diastolic dysfunction.   3. History of gallstones status post cholecystectomy.   4. Hypertension.   5. Hyperlipidemia.   6. History of peptic ulcer disease.   7. Osteoarthritis  8. Benign prostatic hypertrophy  9. Obesity 10.Nephrolithiasis, hx of 11. Diverticulosis, colon  ED  Review of Systems       As per HPI and past medical history; otherwise all systems negative.   Vital Signs:  Patient profile:   77 year old male Height:      69 inches Weight:  218 pounds BMI:     32.31 Pulse rate:   60 / minute BP sitting:   148 / 70  (right arm) Cuff size:   regular  Vitals Entered By: [name], RMA (August 09, 2009 4:36 PM)  Physical Exam  General:  Gen: well appearing. no resp difficulty HEENT: normal. airway [place] 3 Neck: supple. no JVD. Carotids 2+ bilat; no bruits.  Cor: PMI nondisplaced. Regular rate & rhythm. No rubs, gallops, murmur. Lungs: clear Abdomen: obese. soft, nontender, nondistended. No bruits or masses. Good bowel sounds. Extremities: no cyanosis, clubbing, rash, edema Neuro: alert & orientedx3,  cranial nerves grossly intact. moves all 4 extremities w/o difficulty. affect pleasant    PPM Specifications Following MD:  Lewayne Bunting, MD     PPM Vendor:  [employer]UID[employer]     PPM Model Number:  [id number]     PPM Serial Number:  [id number] PPM DOI:  02/12/2007     PPM Implanting MD:  Lewayne Bunting, MD  Lead 1    Location: [facility]     DOI: 02/12/2007     Model #: [id number]     Serial #: [id number]     Status: active Lead 2    Location: [facility]     DOI: 02/12/2007     Model #: [id number]     Serial #: [id number]     Status: active  Magnet Response Rate:  BOL 100 ERI 85  Indications:  Tachy brady syndrome; A-fib   PPM Follow Up Pacer Dependent:  No      Episodes Coumadin:  Yes  Parameters Mode:  DDDR     Lower Rate Limit:  60     Upper Rate Limit:  120 Paced AV Delay:  300     Sensed AV Delay:  260  Impression & Recommendations:  Problem # 1:  ATRIAL FIBRILLATION (ICD-427.31) Maintaining SR on stoalol. QT interval ok.  Problem # 2:  BRADYCARDIA (ICD-427.89) Suspect this is contributing to his fatigue. Will stop metoprolol and switch diltiazem to amlodipine 10 once daily.   Problem # 3:  CORONARY ARTERY DISEASE (ICD-414.00) Stable. No evidence of ischemia. Continue current regimen.  Problem # 4:  HYPERTENSION (ICD-401.9) BP mildly elevated here. Will see how he responds to change in meds.  Other Orders: EKG w/ Interpretation (93000)  Patient Instructions: 1)  Stop Metoprolol 2)  Strop Diltiazem 3)  Start Amlodipine 10mg  daily 4)  Follow up in 6 months Prescriptions: AMLODIPINE BESYLATE 10 MG TABS (AMLODIPINE BESYLATE) Take one tablet by mouth daily  #30 x 6   Entered by:   Meredith Staggers, RN   Authorized by:   Dolores Patty, MD, [facility]   Signed by:   Meredith Staggers, RN on 08/09/2009   Method used:   Electronically to        [facility] [address] (retail)       [address]       [place], [place]  [address]       Ph: [phone]       Fax: [phone]   RxID:   [id number]

## 2010-05-15 NOTE — Cardiovascular Report (Signed)
Summary: TTM   TTM   Imported By: Roderic Ovens 01/01/2010 11:07:33  _____________________________________________________________________  External Attachment:    Type:   Image     Comment:   External Document

## 2010-05-15 NOTE — Medication Information (Signed)
Summary: rov/sp  Anticoagulant Therapy  Managed by: Weston Brass, PharmD Referring MD: Arvilla Meres PCP: Gordy Savers  MD Supervising MD: Shirlee Latch MD, Honesty Menta Indication 1: Atrial Fibrillation (ICD-427.31) Lab Used: [facility] Site: [facility] INR POC 2.4 INR RANGE 2 - 3  Dietary changes: no    Health status changes: no    Bleeding/hemorrhagic complications: no    Recent/future hospitalizations: no    Any changes in medication regimen? no    Recent/future dental: no  Any missed doses?: no       Is patient compliant with meds? yes       Allergies: 1)  ! Sulfa  Anticoagulation Management History:      The patient is taking warfarin and comes in today for a routine follow up visit.  Positive risk factors for bleeding include an age of 75 years or older.  The bleeding index is 'intermediate risk'.  Positive CHADS2 values include History of HTN and Age > 69 years old.  The start date was 12/02/2006.  His last INR was 1.2 RATIO.  Anticoagulation responsible provider: Shirlee Latch MD, Delio Slates.  INR POC: 2.4.  Cuvette Lot#: [id number].  Exp: 12/2010.    Anticoagulation Management Assessment/Plan:      The patient's current anticoagulation dose is Warfarin sodium 5 mg  tabs: Take as directed by coumadin clinic..  The target INR is 2.0-3.0.  The next INR is due 11/29/2009.  Anticoagulation instructions were given to patient.  Results were reviewed/authorized by Weston Brass, PharmD.  He was notified by Dillard Cannon.         Prior Anticoagulation Instructions: INR 1.9  Take 1 tablet tomorrow then increase dose to 1/2 tablet every day except 1 tablet on Sunday and Wednesday.   Current Anticoagulation Instructions: INR 2.4  Continue same dose of 1 tab on Sunday and Wednesday and 1/2 tab all other days.  Re-check INR in 4 weeks.

## 2010-05-15 NOTE — Cardiovascular Report (Signed)
Summary: TTM   TTM   Imported By: Roderic Ovens 03/23/2010 11:21:20  _____________________________________________________________________  External Attachment:    Type:   Image     Comment:   External Document

## 2010-05-15 NOTE — Assessment & Plan Note (Signed)
Summary: f32m    Visit Type:  Follow-up Primary Provider:  Gordy Savers  MD   History of Present Illness: Primary Cardiologist:  Dr. Arvilla Meres Primary Electrophysiologist:  Dr. Lewayne Bunting  Francisco Merritt is a delightful 77yo male with a history of obesity, hypertension, one-vessel coronary artery disease with a totally occluded diagonal branch by catheterization. He also history of atrial fibrillation, complicated by tachybradycardia syndrome and is status post pacemaker implantation and takes coumadin.  He is also on sotalol therapy.  His beta blocker was discontinued at his last visit and his diltiazem was changed to amlodipine due to low heart rates.  He recently followed up with Dr. Ladona Ridgel in the EP clinic.  Since last being seen, he is doing well.  The patient denies chest pain, significant dyspnea, orthopnea, PND, pedal edema or syncope.  The patient describes NYHA Class 2 symptoms.   He denies palpitations.   Current Medications (verified): 1)  Aspirin 81 Mg Tbec (Aspirin) .... Take 1 Tablet By Mouth Every Morning 2)  Warfarin Sodium 5 Mg  Tabs (Warfarin Sodium) .... Take As Directed By Coumadin Clinic. 3)  Simvastatin 20 Mg  Tabs (Simvastatin) .Marland Kitchen.. 1 Once Daily 4)  Terazosin Hcl 10 Mg Caps (Terazosin Hcl) .... Take 1 Capsule By Mouth Once A Day 5)  Multivitamins  Tabs (Multiple Vitamin) .... Take 1 Tablet By Mouth Once A Day 6)  Vitamin C-Acerola 500 Mg Chew (Ascorbic Acid) .... Take 1 Tablet By Mouth Once A Day 7)  Glucosamine-Chondroitin 500-400 Mg Caps (Glucosamine-Chondroitin) .... Take 1 Tablet By Mouth Two Times A Day 8)  Tylenol Arthritis Pain 650 Mg Cr-Tabs (Acetaminophen) .... As Needed 9)  Androgel 50 Mg/5gm Gel (Testosterone) .... Use Q Am 10)  Sotalol Hcl 80 Mg Tabs (Sotalol Hcl) .... Take One Tablet By Mouth Twice A Day 11)  Potassium Chloride Crys Cr 20 Meq Cr-Tabs (Potassium Chloride Crys Cr) .... Take One Tablet By Mouth Daily 12)  Nitrostat 0.4 Mg Subl  (Nitroglycerin) .Marland Kitchen.. 1 Tablet Under Tongue At Onset of Chest Pain; You May Repeat Every 5 Minutes For Up To 3 Doses. 13)  Furosemide 20 Mg Tabs (Furosemide) .... Take One Tablet By Mouth Daily. 14)  Amlodipine Besylate 10 Mg Tabs (Amlodipine Besylate) .... Take One Tablet By Mouth Daily  Allergies: 1)  ! Sulfa  Past History:  Past Medical History: Last updated: 08/22/2009  1. Paroxysmal atrial fibrillation with tachybrady syndrome.       --s/p Guidant Insignia pacemaker      --stqarted sotalol 3/11  2. One vessel coronary artery disease with normal ejection fraction.       --Cath 9/08. EF normal. LM nl, LAD 40%, D2 occulded with collaterals, LCX 40%, RCA 30%      --Echo 11/10 EF 50-55% grade I diastolic dysfunction.   3. History of gallstones status post cholecystectomy.   4. Hypertension.   5. Hyperlipidemia.   6. History of peptic ulcer disease.   7. Osteoarthritis  8. Benign prostatic hypertrophy  9. Obesity 10.Nephrolithiasis, hx of 11. Diverticulosis, colon 12.  Testosterone deficiency  ED  Review of Systems GI:  Complains of indigestion/heartburn and dysphagia; Has appt with PCP soon to discuss possible referral to GI.Marland Kitchen  Vital Signs:  Patient profile:   77 year old male Height:      69 inches Weight:      220 pounds BMI:     32.61 Pulse rate:   88 / minute BP sitting:   100 /  80  (right arm)  Vitals Entered By: Laurance Flatten CMA (February 15, 2010 10:10 AM)  Physical Exam  General:  Well nourished, well developed, in no acute distress HEENT: normal Neck: no JVD Cardiac:  normal S1, S2; irreg irreg rhythm;  no murmur Lungs:  clear to auscultation bilaterally, no wheezing, rhonchi or rales Abd: soft, nontender, no hepatomegaly Ext: no edema Vascular: no carotid  bruits Skin: warm and dry Neuro: CN2 2-12 intact, nonfocal    EKG  Procedure date:  02/15/2010  Findings:      underlying AFib with vent rate of 81 v-pacing for the most part with occ  intrinsic beat noted   PPM Specifications Following MD:  Lewayne Bunting, MD     PPM Vendor:  [employer]     PPM Model Number:  [id number]     PPM Serial Number:  [id number] PPM DOI:  02/12/2007     PPM Implanting MD:  Lewayne Bunting, MD  Lead 1    Location: [facility]     DOI: 02/12/2007     Model #: [id number]     Serial #: [id number]     Status: active Lead 2    Location: [facility]     DOI: 02/12/2007     Model #: [id number]     Serial #: [id number]     Status: active  Magnet Response Rate:  BOL 100 ERI 85  Indications:  Tachy brady syndrome; A-fib   PPM Follow Up Pacer Dependent:  No      Episodes Coumadin:  Yes  Parameters Mode:  DDDR     Lower Rate Limit:  60     Upper Rate Limit:  120 Paced AV Delay:  300     Sensed AV Delay:  260  Impression & Recommendations:  Problem # 1:  CORONARY ATHEROSCLEROSIS NATIVE CORONARY ARTERY (ICD-414.01) No anginal symptoms.  Problem # 2:  ATRIAL FIBRILLATION (ICD-427.31)  Remains on sotalol. Maintains NSR for the most part by device check recently.  Mode switches noted. In AFib today with controlled VR. Saw Dr. Ladona Ridgel last week. Remains on coumadin.  Most recent INR therapeutic with f/u in coumadin clinic.  Problem # 3:  HYPERTENSION (ICD-401.9)  The blood pressure is well controlled.  Continue the current medications.   Problem # 4:  HYPERLIPIDEMIA (ICD-272.4)  Check FLP and LFTs  Problem # 5:  CHRONIC DIASTOLIC HEART FAILURE (ICD-428.32)  Occurred in setting of AF with RVR in past. NYHA Class 2. Stable.  Orders: EKG w/ Interpretation (93000)  Patient Instructions: 1)  Your physician recommends that you schedule a follow-up appointment in: 6 months with Dr. Gala Romney 2)  Your physician recommends that you return for a FASTING lipid profile: Fating lipid and LFT's at clinic visit wit PCP. 3)  Your physician recommends that you continue on your current medications as directed. Please refer to the Current Medication list given to you today.

## 2010-05-15 NOTE — Medication Information (Signed)
Summary: rov/tm  Anticoagulant Therapy  Managed by: Bethena Midget, RN, BSN Referring MD: Arvilla Meres PCP: Gordy Savers  MD Supervising MD: Graciela Husbands MD, Viviann Spare Indication 1: Atrial Fibrillation (ICD-427.31) Lab Used: [facility] Site: [facility] INR POC 3.4 INR RANGE 2 - 3  Dietary changes: no    Health status changes: no    Bleeding/hemorrhagic complications: no    Recent/future hospitalizations: yes       Details: Was in [facility] last week  Any changes in medication regimen? yes       Details: metoprolol doses was decreased. Diltiazem added, lasix added, NTG sl added, Potassium added and Sotalol added.   Recent/future dental: no  Any missed doses?: no       Is patient compliant with meds? yes      Comments: Seeing Dr. Ladona Ridgel today.   Allergies: 1)  ! Sulfa  Anticoagulation Management History:      The patient is taking warfarin and comes in today for a routine follow up visit.  Positive risk factors for bleeding include an age of 15 years or older.  The bleeding index is 'intermediate risk'.  Positive CHADS2 values include History of HTN and Age > 79 years old.  The start date was 12/02/2006.  His last INR was 1.2 RATIO.  Anticoagulation responsible provider: Graciela Husbands MD, Viviann Spare.  INR POC: 3.4.  Cuvette Lot#: [id number].  Exp: 08/2010.    Anticoagulation Management Assessment/Plan:      The patient's current anticoagulation dose is Warfarin sodium 5 mg  tabs: Take as directed by coumadin clinic..  The target INR is 2.0-3.0.  The next INR is due 08/01/2009.  Anticoagulation instructions were given to patient.  Results were reviewed/authorized by Bethena Midget, RN, BSN.  He was notified by Bethena Midget, RN, BSN.         Prior Anticoagulation Instructions: INR 3.0 Today take 1mg s then resume 2.5mg s daily except 5mg s on Tuesdays and Saturdays. Recheck in 4 weeks.  Sample given coumadin 1mg s Lot [id number] Exp Nov 2011  one tablet given   Current Anticoagulation  Instructions: INR 3.4 Skip today's dose the change dose to 2.5mg s daily except 5mg s on Wednesdays. Recheck in  2 weeks.

## 2010-05-15 NOTE — Medication Information (Signed)
Summary: rov/sp  Anticoagulant Therapy  Managed by: Weston Brass, PharmD Referring MD: Arvilla Meres PCP: Francisco Savers  MD Supervising MD: Gala Romney MD, Reuel Boom Indication 1: Atrial Fibrillation (ICD-427.31) Lab Used: [facility] Site: [facility] INR POC 1.9 INR RANGE 2 - 3  Dietary changes: no    Health status changes: no    Bleeding/hemorrhagic complications: no    Recent/future hospitalizations: no    Any changes in medication regimen? no    Recent/future dental: no  Any missed doses?: no       Is patient compliant with meds? yes       Allergies: 1)  ! Sulfa  Anticoagulation Management History:      The patient is taking warfarin and comes in today for a routine follow up visit.  Positive risk factors for bleeding include an age of 20 years or older.  The bleeding index is 'intermediate risk'.  Positive CHADS2 values include History of HTN and Age > 32 years old.  The start date was 12/02/2006.  His last INR was 1.2 RATIO.  Anticoagulation responsible provider: Bensimhon MD, Reuel Boom.  INR POC: 1.9.  Cuvette Lot#: [id number].  Exp: 12/2010.    Anticoagulation Management Assessment/Plan:      The patient's current anticoagulation dose is Warfarin sodium 5 mg  tabs: Take as directed by coumadin clinic..  The target INR is 2.0-3.0.  The next INR is due 11/01/2009.  Anticoagulation instructions were given to patient.  Results were reviewed/authorized by Weston Brass, PharmD.  He was notified by Weston Brass PharmD.         Prior Anticoagulation Instructions: INR 2.0  Continue same dose of 1/2 tablet every day except 1 tablet on Wednesday.   Current Anticoagulation Instructions: INR 1.9  Take 1 tablet tomorrow then increase dose to 1/2 tablet every day except 1 tablet on Sunday and Wednesday.

## 2010-05-15 NOTE — Medication Information (Signed)
Summary: rov/sel  Anticoagulant Therapy  Managed by: Bethena Midget, RN, BSN Referring MD: Arvilla Meres PCP: Gordy Savers  MD Supervising MD: Eden Emms MD, Theron Arista Indication 1: Atrial Fibrillation (ICD-427.31) Lab Used: [facility] Site: [facility] INR POC 2.7 INR RANGE 2 - 3  Dietary changes: no    Health status changes: no    Bleeding/hemorrhagic complications: no    Recent/future hospitalizations: no    Any changes in medication regimen? no    Recent/future dental: no  Any missed doses?: no       Is patient compliant with meds? yes       Allergies: 1)  ! Sulfa  Anticoagulation Management History:      The patient is taking warfarin and comes in today for a routine follow up visit.  Positive risk factors for bleeding include an age of 75 years or older.  The bleeding index is 'intermediate risk'.  Positive CHADS2 values include History of CHF, History of HTN, and Age > 81 years old.  The start date was 12/02/2006.  His last INR was 1.2 RATIO.  Anticoagulation responsible provider: Eden Emms MD, Theron Arista.  INR POC: 2.7.  Cuvette Lot#: [id number].  Exp: 03/2011.    Anticoagulation Management Assessment/Plan:      The patient's current anticoagulation dose is Warfarin sodium 5 mg  tabs: Take as directed by coumadin clinic..  The target INR is 2.0-3.0.  The next INR is due 03/21/2010.  Anticoagulation instructions were given to patient.  Results were reviewed/authorized by Bethena Midget, RN, BSN.  He was notified by Bethena Midget, RN, BSN.         Prior Anticoagulation Instructions: INR 2.3  Continue taking 1/2 tablet everyday except 1 tablet on Sunday and Wednesday. Recheck in 4 weeks.   Current Anticoagulation Instructions: INR 2.7 Continue 2.5mg s daily except 5mg s on Sundays and Wednesdays. Recheck in 4 weeks.

## 2010-05-15 NOTE — Miscellaneous (Signed)
Summary: Orders Update  Clinical Lists Changes  Orders: Added new Test order of Carotid Duplex (Carotid Duplex) - Signed 

## 2010-05-15 NOTE — Medication Information (Signed)
Summary: rov/tm  Anticoagulant Therapy  Managed by: Cloyde Reams, RN, BSN Referring MD: Arvilla Meres PCP: Gordy Savers  MD Supervising MD: Excell Seltzer MD, Casimiro Needle Indication 1: Atrial Fibrillation (ICD-427.31) Lab Used: [facility] Site: [facility] INR POC 2.0 INR RANGE 2 - 3  Dietary changes: no    Health status changes: no    Bleeding/hemorrhagic complications: no    Recent/future hospitalizations: no    Any changes in medication regimen? no    Recent/future dental: no  Any missed doses?: no       Is patient compliant with meds? yes       Allergies (verified): 1)  ! Sulfa  Anticoagulation Management History:      The patient is taking warfarin and comes in today for a routine follow up visit.  Positive risk factors for bleeding include an age of 75 years or older.  The bleeding index is 'intermediate risk'.  Positive CHADS2 values include History of HTN and Age > 20 years old.  The start date was 12/02/2006.  His last INR was 1.2 RATIO.  Anticoagulation responsible provider: Excell Seltzer MD, Casimiro Needle.  INR POC: 2.0.  Cuvette Lot#: [id number].  Exp: 09/2010.    Anticoagulation Management Assessment/Plan:      The patient's current anticoagulation dose is Warfarin sodium 5 mg  tabs: Take as directed by coumadin clinic..  The target INR is 2.0-3.0.  The next INR is due 08/23/2009.  Anticoagulation instructions were given to patient.  Results were reviewed/authorized by Cloyde Reams, RN, BSN.  He was notified by Cloyde Reams RN.         Prior Anticoagulation Instructions: INR 3.4 Skip today's dose the change dose to 2.5mg s daily except 5mg s on Wednesdays. Recheck in  2 weeks.   Current Anticoagulation Instructions: INR 2.0  Take 1 tablet today, then resume same dosage 1/2 tablet daily except 1 tablet on Wednesdays.  Recheck in 3 weeks.

## 2010-05-15 NOTE — Medication Information (Signed)
Summary: rov/sp  Anticoagulant Therapy  Managed by: Bethena Midget, RN, BSN Referring MD: Arvilla Meres PCP: Gordy Savers  MD Supervising MD: Myrtis Ser MD, Tinnie Gens Indication 1: Atrial Fibrillation (ICD-427.31) Lab Used: [facility] Site: [facility] INR POC 2.4 INR RANGE 2 - 3  Dietary changes: no    Health status changes: no    Bleeding/hemorrhagic complications: no    Recent/future hospitalizations: no    Any changes in medication regimen? no    Recent/future dental: no  Any missed doses?: no       Is patient compliant with meds? yes       Allergies: 1)  ! Sulfa  Anticoagulation Management History:      The patient is taking warfarin and comes in today for a routine follow up visit.  Positive risk factors for bleeding include an age of 36 years or older.  The bleeding index is 'intermediate risk'.  Positive CHADS2 values include History of HTN and Age > 62 years old.  The start date was 12/02/2006.  His last INR was 1.2 RATIO.  Anticoagulation responsible provider: Myrtis Ser MD, Tinnie Gens.  INR POC: 2.4.  Cuvette Lot#: [id number].  Exp: 01/2011.    Anticoagulation Management Assessment/Plan:      The patient's current anticoagulation dose is Warfarin sodium 5 mg  tabs: Take as directed by coumadin clinic..  The target INR is 2.0-3.0.  The next INR is due 01/24/2010.  Anticoagulation instructions were given to patient.  Results were reviewed/authorized by Bethena Midget, RN, BSN.  He was notified by Bethena Midget, RN, BSN.         Prior Anticoagulation Instructions: INR 2.7   Continue with 1/2 tablet daily except 1 tablet Sun and Wed.  Return to clinic in 4 weeks.  Current Anticoagulation Instructions: INR 2.4 Continue 2.5mg s everyday except 5mg s on Sundays and Wednesdays. Recheck in 4 weeks.

## 2010-05-15 NOTE — Medication Information (Signed)
Summary: rov/ln  Anticoagulant Therapy  Managed by: Weston Brass, PharmD Referring MD: Arvilla Meres PCP: Gordy Savers  MD Supervising MD: Eden Emms MD, Theron Arista Indication 1: Atrial Fibrillation (ICD-427.31) Lab Used: [facility] Site: [facility] INR POC 2.7 INR RANGE 2 - 3  Dietary changes: no    Health status changes: no    Bleeding/hemorrhagic complications: no    Recent/future hospitalizations: no    Any changes in medication regimen? no    Recent/future dental: no  Any missed doses?: no       Is patient compliant with meds? yes       Allergies: 1)  ! Sulfa  Anticoagulation Management History:      The patient is taking warfarin and comes in today for a routine follow up visit.  Positive risk factors for bleeding include an age of 3 years or older.  The bleeding index is 'intermediate risk'.  Positive CHADS2 values include History of HTN and Age > 26 years old.  The start date was 12/02/2006.  His last INR was 1.2 RATIO.  Anticoagulation responsible provider: Eden Emms MD, Theron Arista.  INR POC: 2.7.  Cuvette Lot#: [id number].  Exp: 01/2011.    Anticoagulation Management Assessment/Plan:      The patient's current anticoagulation dose is Warfarin sodium 5 mg  tabs: Take as directed by coumadin clinic..  The target INR is 2.0-3.0.  The next INR is due 12/27/2009.  Anticoagulation instructions were given to patient.  Results were reviewed/authorized by Weston Brass, PharmD.  He was notified by Liana Gerold, PharmD Candidate.         Prior Anticoagulation Instructions: INR 2.4  Continue same dose of 1 tab on Sunday and Wednesday and 1/2 tab all other days.  Re-check INR in 4 weeks.  Current Anticoagulation Instructions: INR 2.7   Continue with 1/2 tablet daily except 1 tablet Sun and Wed.  Return to clinic in 4 weeks.

## 2010-05-15 NOTE — Cardiovascular Report (Signed)
Summary: Office Visit   Office Visit   Imported By: Roderic Ovens 07/21/2009 13:38:28  _____________________________________________________________________  External Attachment:    Type:   Image     Comment:   External Document

## 2010-05-15 NOTE — Assessment & Plan Note (Signed)
Summary: 6 month rov/njr   Vital Signs:  Patient profile:   77 year old [place] Weight:      218 pounds Temp:     97.7 degrees F oral BP sitting:   130 / Francisco  (right arm) Cuff size:   regular  Vitals Entered By: Duard Brady LPN (February 20, 2010 8:15 AM) CC: 6 mos rov - doing well Is Patient Diabetic? No   Primary Care Provider:  Gordy Savers  MD  CC:  6 mos rov - doing well.  History of Present Illness: 77 year old patient who has a history of coronary artery disease followed closely by cardiology.  He has atrial for ablation on chronic anticoagulation.  He has dyslipidemia.  He has a history of coronary artery disease and chronic diastolic heart failure.  His cardiac status has been stable. Complaints today include dysphasia, occasionally associated with pain.  This has been present for about 6 months and occurs with almost every meal.  There has been no vomiting, weight loss. He also complains of ED; he is on testosterone replacement and ED medications have not been helpful.  Preventive Screening-Counseling & Management  Alcohol-Tobacco     Smoking Status: never  Allergies: 1)  ! Sulfa  Past History:  Past Medical History: Reviewed history from 08/22/2009 and no changes required.  1. Paroxysmal atrial fibrillation with tachybrady syndrome.       --s/p [employer]ia pacemaker      --stqarted sotalol 3/11  2. One vessel coronary artery disease with normal ejection fraction.       --Cath 9/08. EF normal. LM nl, LAD 40%, D2 occulded with collaterals, LCX 40%, RCA 30%      --Echo 11/10 EF 50-55% grade I diastolic dysfunction.   3. History of gallstones status post cholecystectomy.   4. Hypertension.   5. Hyperlipidemia.   6. History of peptic ulcer disease.   7. Osteoarthritis  8. Benign prostatic hypertrophy  9. Obesity 10.Nephrolithiasis, hx of 11. Diverticulosis, colon 12.  Testosterone deficiency  ED  Past Surgical History: Reviewed history  from 09/06/2008 and no changes required.  Cholecystectomy cardiac catheterization colonoscopy 1999 upper gastroduodenoscopy, 1999 status post pacemaker carotid artery.   Doppler evaluation  Review of Systems  The patient denies anorexia, fever, weight loss, weight gain, vision loss, decreased hearing, hoarseness, chest pain, syncope, dyspnea on exertion, peripheral edema, prolonged cough, headaches, hemoptysis, abdominal pain, melena, hematochezia, severe indigestion/heartburn, hematuria, incontinence, genital sores, muscle weakness, suspicious skin lesions, transient blindness, difficulty walking, depression, unusual weight change, abnormal bleeding, enlarged lymph nodes, angioedema, breast masses, and testicular masses.    Physical Exam  General:  overweight-appearing.  normal blood pressureoverweight-appearing.   Head:  Normocephalic and atraumatic without obvious abnormalities. No apparent alopecia or balding. Eyes:  No corneal or conjunctival inflammation noted. EOMI. Perrla. Funduscopic exam benign, without hemorrhages, exudates or papilledema. Vision grossly normal. Mouth:  Oral mucosa and oropharynx without lesions or exudates.  Teeth in good repair. Neck:  No deformities, masses, or tenderness noted. Lungs:  Normal respiratory effort, chest expands symmetrically. Lungs are clear to auscultation, no crackles or wheezes. Heart:  Normal rate and regular rhythm. S1 and S2 normal without gallop, murmur, click, rub or other extra sounds. Abdomen:  Bowel sounds positive,abdomen soft and non-tender without masses, organomegaly or hernias noted. Msk:  No deformity or scoliosis noted of thoracic or lumbar spine.   Pulses:  R and L carotid,radial,femoral,dorsalis pedis and posterior tibial pulses are full and equal bilaterally Extremities:  No clubbing, cyanosis, edema, or deformity noted with normal full range of motion of all joints.   Skin:  Intact without suspicious lesions or  rashes Cervical Nodes:  No lymphadenopathy noted   Impression & Recommendations:  Problem # 1:  DYSPHAGIA UNSPECIFIED (ICD-787.20)  will set up for GI consult  Orders: Gastroenterology Referral (GI)  Problem # 2:  ENCOUNTER FOR LONG-TERM USE OF OTHER MEDICATIONS (ICD-V58.69)  Orders: Venipuncture (16109) TLB-Lipid Panel (80061-LIPID) TLB-AST (SGOT) (84450-SGOT) TLB-Testosterone, Total (84403-TESTO) Specimen Handling (60454)  Problem # 3:  ERECTILE DYSFUNCTION (ICD-302.72)  Orders: TLB-Testosterone, Total (84403-TESTO)  Problem # 4:  ATRIAL FIBRILLATION (ICD-427.31)  His updated medication list for this problem includes:    Aspirin 81 Mg Tbec (Aspirin) .Marland Kitchen... Take 1 tablet by mouth every morning    Warfarin Sodium 5 Mg Tabs (Warfarin sodium) .Marland Kitchen... Take as directed by coumadin clinic.    Sotalol Hcl Francisco Mg Tabs (Sotalol hcl) .Marland Kitchen... Take one tablet by mouth twice a day    Amlodipine Besylate 10 Mg Tabs (Amlodipine besylate) .Marland Kitchen... Take one tablet by mouth daily  His updated medication list for this problem includes:    Aspirin 81 Mg Tbec (Aspirin) .Marland Kitchen... Take 1 tablet by mouth every morning    Warfarin Sodium 5 Mg Tabs (Warfarin sodium) .Marland Kitchen... Take as directed by coumadin clinic.    Sotalol Hcl Francisco Mg Tabs (Sotalol hcl) .Marland Kitchen... Take one tablet by mouth twice a day    Amlodipine Besylate 10 Mg Tabs (Amlodipine besylate) .Marland Kitchen... Take one tablet by mouth daily  Problem # 5:  HYPERTENSION (ICD-401.9)  His updated medication list for this problem includes:    Terazosin Hcl 10 Mg Caps (Terazosin hcl) .Marland Kitchen... Take 1 capsule by mouth once a day    Sotalol Hcl Francisco Mg Tabs (Sotalol hcl) .Marland Kitchen... Take one tablet by mouth twice a day    Furosemide 20 Mg Tabs (Furosemide) .Marland Kitchen... Take one tablet by mouth daily.    Amlodipine Besylate 10 Mg Tabs (Amlodipine besylate) .Marland Kitchen... Take one tablet by mouth daily  His updated medication list for this problem includes:    Terazosin Hcl 10 Mg Caps (Terazosin hcl)  .Marland Kitchen... Take 1 capsule by mouth once a day    Sotalol Hcl Francisco Mg Tabs (Sotalol hcl) .Marland Kitchen... Take one tablet by mouth twice a day    Furosemide 20 Mg Tabs (Furosemide) .Marland Kitchen... Take one tablet by mouth daily.    Amlodipine Besylate 10 Mg Tabs (Amlodipine besylate) .Marland Kitchen... Take one tablet by mouth daily  Problem # 6:  HYPERLIPIDEMIA (ICD-272.4)  His updated medication list for this problem includes:    Simvastatin 20 Mg Tabs (Simvastatin) .Marland Kitchen... 1 once daily    His updated medication list for this problem includes:    Simvastatin 20 Mg Tabs (Simvastatin) .Marland Kitchen... 1 once daily  Orders: Venipuncture (09811) TLB-Lipid Panel (80061-LIPID) TLB-AST (SGOT) (84450-SGOT) Specimen Handling (91478)  Complete Medication List: 1)  Aspirin 81 Mg Tbec (Aspirin) .... Take 1 tablet by mouth every morning 2)  Warfarin Sodium 5 Mg Tabs (Warfarin sodium) .... Take as directed by coumadin clinic. 3)  Simvastatin 20 Mg Tabs (Simvastatin) .Marland Kitchen.. 1 once daily 4)  Terazosin Hcl 10 Mg Caps (Terazosin hcl) .... Take 1 capsule by mouth once a day 5)  Multivitamins Tabs (Multiple vitamin) .... Take 1 tablet by mouth once a day 6)  Vitamin C-acerola 500 Mg Chew (Ascorbic acid) .... Take 1 tablet by mouth once a day 7)  Glucosamine-chondroitin 500-400 Mg Caps (Glucosamine-chondroitin) .... Take  1 tablet by mouth two times a day 8)  Tylenol Arthritis Pain 650 Mg Cr-tabs (Acetaminophen) .... As needed 9)  Androgel 50 Mg/5gm Gel (Testosterone) .... Use q am 10)  Sotalol Hcl Francisco Mg Tabs (Sotalol hcl) .... Take one tablet by mouth twice a day 11)  Potassium Chloride Crys Cr 20 Meq Cr-tabs (Potassium chloride crys cr) .... Take one tablet by mouth daily 12)  Nitrostat 0.4 Mg Subl (Nitroglycerin) .Marland Kitchen.. 1 tablet under tongue at onset of chest pain; you may repeat every 5 minutes for up to 3 doses. 13)  Furosemide 20 Mg Tabs (Furosemide) .... Take one tablet by mouth daily. 14)  Amlodipine Besylate 10 Mg Tabs (Amlodipine besylate) .... Take  one tablet by mouth daily  Patient Instructions: 1)  Please schedule a follow-up appointment in 6 months for CPX  2)  Limit your Sodium (Salt). 3)  It is important that you exercise regularly at least 20 minutes 5 times a week. If you develop chest pain, have severe difficulty breathing, or feel very tired , stop exercising immediately and seek medical attention. 4)  You need to lose weight. Consider a lower calorie diet and regular exercise.  Prescriptions: AMLODIPINE BESYLATE 10 MG TABS (AMLODIPINE BESYLATE) Take one tablet by mouth daily  #90 x 6   Entered and Authorized by:   Gordy Savers  MD   Signed by:   Gordy Savers  MD on 02/20/2010   Method used:   Print then Give to Patient   RxID:   [id number] FUROSEMIDE 20 MG TABS (FUROSEMIDE) Take one tablet by mouth daily.  #90 x 6   Entered and Authorized by:   Gordy Savers  MD   Signed by:   Gordy Savers  MD on 02/20/2010   Method used:   Print then Give to Patient   RxID:   [id number] POTASSIUM CHLORIDE CRYS CR 20 MEQ CR-TABS (POTASSIUM CHLORIDE CRYS CR) Take one tablet by mouth daily  #90 x 6   Entered and Authorized by:   Gordy Savers  MD   Signed by:   Gordy Savers  MD on 02/20/2010   Method used:   Print then Give to Patient   RxID:   [id number] SOTALOL HCL Francisco MG TABS (SOTALOL HCL) Take one tablet by mouth twice a day  #120 x 3   Entered and Authorized by:   Gordy Savers  MD   Signed by:   Gordy Savers  MD on 02/20/2010   Method used:   Print then Give to Patient   RxID:   [id number] ANDROGEL 50 MG/5GM GEL (TESTOSTERONE) use q am  #90 x 6   Entered and Authorized by:   Gordy Savers  MD   Signed by:   Gordy Savers  MD on 02/20/2010   Method used:   Print then Give to Patient   RxID:   [id number] TERAZOSIN HCL 10 MG CAPS (TERAZOSIN HCL) Take 1 capsule by mouth once a day  #90 x 6   Entered and Authorized by:   Gordy Savers   MD   Signed by:   Gordy Savers  MD on 02/20/2010   Method used:   Print then Give to Patient   RxID:   [id number] SIMVASTATIN 20 MG  TABS (SIMVASTATIN) 1 once daily  #90 x 6   Entered and Authorized by:   Gordy Savers  MD   Signed by:   Theron Arista  Lysle Dingwall  MD on 02/20/2010   Method used:   Print then Give to Patient   RxID:   [id number] WARFARIN SODIUM 5 MG  TABS (WARFARIN SODIUM) Take as directed by coumadin clinic.  #50 Tablet x 2   Entered and Authorized by:   Gordy Savers  MD   Signed by:   Gordy Savers  MD on 02/20/2010   Method used:   Print then Give to Patient   RxID:   [id number]    Orders Added: 1)  Est. Patient Level IV [84696] 2)  Venipuncture [29528] 3)  TLB-Lipid Panel [80061-LIPID] 4)  TLB-AST (SGOT) [84450-SGOT] 5)  TLB-Testosterone, Total [84403-TESTO] 6)  Specimen Handling [99000] 7)  Gastroenterology Referral [GI]   Immunization History:  Influenza Immunization History:    Influenza:  Historical (01/27/2010)   Immunization History:  Influenza Immunization History:    Influenza:  Historical (01/27/2010) given at urgent care per pt. KIK

## 2010-05-15 NOTE — Progress Notes (Signed)
Summary: Change in Rx  Phone Note Call from Patient Call back at [facility] Phone [phone]   Caller: Patient Complaint: Breathing Problems Summary of Call: Patient called in today asking about the increase in the andrgel. Patient and his wife are concerned with the price of the androgel and would like to know if there are any alternatives. Please advise.  Initial call taken by: Harold Barban,  February 26, 2010 1:25 PM  Follow-up for Phone Call        no cheaper alternatives Follow-up by: Gordy Savers  MD,  February 26, 2010 5:20 PM  Additional Follow-up for Phone Call Additional follow up Details #1::        called wife - informed about not alternatives.   Will check with rep. for pt assistance programs. KIK Additional Follow-up by: Duard Brady LPN,  February 26, 2010 5:32 PM

## 2010-05-15 NOTE — Medication Information (Signed)
Summary: rov/eac  Anticoagulant Therapy  Managed by: Bethena Midget, RN, BSN Referring MD: Arvilla Meres PCP: Gordy Savers  MD Supervising MD: Johney Frame MD, Fayrene Fearing Indication 1: Atrial Fibrillation (ICD-427.31) Lab Used: [facility] Site: [facility] INR POC 3.0 INR RANGE 2 - 3  Dietary changes: no    Health status changes: no    Bleeding/hemorrhagic complications: no    Recent/future hospitalizations: no    Any changes in medication regimen? no    Recent/future dental: no  Any missed doses?: no       Is patient compliant with meds? yes       Allergies: 1)  ! Sulfa  Anticoagulation Management History:      The patient is taking warfarin and comes in today for a routine follow up visit.  Positive risk factors for bleeding include an age of 18 years or older.  The bleeding index is 'intermediate risk'.  Positive CHADS2 values include History of HTN and Age > 75 years old.  The start date was 12/02/2006.  His last INR was 1.2 RATIO.  Anticoagulation responsible provider: Josphine Laffey MD, Fayrene Fearing.  INR POC: 3.0.  Cuvette Lot#: [id number].  Exp: 08/2010.    Anticoagulation Management Assessment/Plan:      The patient's current anticoagulation dose is Warfarin sodium 5 mg  tabs: Take as directed by coumadin clinic..  The target INR is 2.0-3.0.  The next INR is due 07/18/2009.  Anticoagulation instructions were given to patient.  Results were reviewed/authorized by Bethena Midget, RN, BSN.  He was notified by Bethena Midget, RN, BSN.         Prior Anticoagulation Instructions: INR 2.7  Continue current dosing schedule.  Take 1 tablet (5 mg) on Tuesday and Saturday, and take 1/2 tablet (2.5 mg) all other days. Return to clinic in 4 weeks.  Current Anticoagulation Instructions: INR 3.0 Today take 1mg s then resume 2.5mg s daily except 5mg s on Tuesdays and Saturdays. Recheck in 4 weeks.  Sample given coumadin 1mg s Lot [id number] Nov 2011  one tablet given

## 2010-05-15 NOTE — Assessment & Plan Note (Signed)
Summary: guidant    Visit Type:  Follow-up Primary Provider:  Gordy Savers  MD   History of Present Illness: Francisco Merritt returns today for followup.  He is a pleasant 77 yo man with a h/o CAD, HTN, atrial fibrillation and bradycardia.  Since I saw him last he had done well.  He has been maintained NSR for the most part.  He remains on Sotalol.  No c/p or sob.  Current Medications (verified): 1)  Aspirin 81 Mg Tbec (Aspirin) .... Take 1 Tablet By Mouth Every Morning 2)  Warfarin Sodium 5 Mg  Tabs (Warfarin Sodium) .... Take As Directed By Coumadin Clinic. 3)  Simvastatin 20 Mg  Tabs (Simvastatin) .Marland Kitchen.. 1 Once Daily 4)  Terazosin Hcl 10 Mg Caps (Terazosin Hcl) .... Take 1 Capsule By Mouth Once A Day 5)  Multivitamins  Tabs (Multiple Vitamin) .... Take 1 Tablet By Mouth Once A Day 6)  Vitamin C-Acerola 500 Mg Chew (Ascorbic Acid) .... Take 1 Tablet By Mouth Once A Day 7)  Glucosamine-Chondroitin 500-400 Mg Caps (Glucosamine-Chondroitin) .... Take 1 Tablet By Mouth Two Times A Day 8)  Tylenol Arthritis Pain 650 Mg Cr-Tabs (Acetaminophen) .... As Needed 9)  Androgel 50 Mg/5gm Gel (Testosterone) .... Use Q Am 10)  Sotalol Hcl 80 Mg Tabs (Sotalol Hcl) .... Take One Tablet By Mouth Twice A Day 11)  Potassium Chloride Crys Cr 20 Meq Cr-Tabs (Potassium Chloride Crys Cr) .... Take One Tablet By Mouth Daily 12)  Nitrostat 0.4 Mg Subl (Nitroglycerin) .Marland Kitchen.. 1 Tablet Under Tongue At Onset of Chest Pain; You May Repeat Every 5 Minutes For Up To 3 Doses. 13)  Furosemide 20 Mg Tabs (Furosemide) .... Take One Tablet By Mouth Daily. 14)  Amlodipine Besylate 10 Mg Tabs (Amlodipine Besylate) .... Take One Tablet By Mouth Daily  Allergies: 1)  ! Sulfa  Past History:  Past Medical History: Last updated: 08/22/2009  1. Paroxysmal atrial fibrillation with tachybrady syndrome.       --s/p Guidant Insignia pacemaker      --stqarted sotalol 3/11  2. One vessel coronary artery disease with normal  ejection fraction.       --Cath 9/08. EF normal. LM nl, LAD 40%, D2 occulded with collaterals, LCX 40%, RCA 30%      --Echo 11/10 EF 50-55% grade I diastolic dysfunction.   3. History of gallstones status post cholecystectomy.   4. Hypertension.   5. Hyperlipidemia.   6. History of peptic ulcer disease.   7. Osteoarthritis  8. Benign prostatic hypertrophy  9. Obesity 10.Nephrolithiasis, hx of 11. Diverticulosis, colon 12.  Testosterone deficiency  ED  Past Surgical History: Last updated: 09/06/2008  Cholecystectomy cardiac catheterization colonoscopy 1999 upper gastroduodenoscopy, 1999 status post pacemaker carotid artery.   Doppler evaluation  Review of Systems  The patient denies chest pain, syncope, dyspnea on exertion, and peripheral edema.    Vital Signs:  Patient profile:   77 year old male Height:      69 inches Weight:      219 pounds BMI:     32.46 Pulse rate:   60 / minute BP sitting:   110 / 70  (left arm)  Vitals Entered By: Francisco Merritt CMA (January 30, 2010 3:24 PM)  Physical Exam  General:  overweight-appearing.  nblood pressureoverweight-appearing.   Head:  Normocephalic and atraumatic without obvious abnormalities. No apparent alopecia or balding. Eyes:  No corneal or conjunctival inflammation noted. EOMI. Perrla. Funduscopic exam benign, without hemorrhages, exudates or papilledema.  Vision grossly normal. Mouth:  Oral mucosa and oropharynx without lesions or exudates.  dentures in place Neck:  No deformities, masses, or tenderness noted. Chest Wall:  Well healed PPM incision. Lungs:  Normal respiratory effort, chest expands symmetrically. Lungs are clear to auscultation, no crackles or wheezes. Heart:  Normal rate and regular rhythm. S1 and S2 normal without gallop, murmur, click, rub or other extra sounds. Abdomen:  Bowel sounds positive,abdomen soft and non-tender without masses, organomegaly or hernias noted. Msk:  No deformity or scoliosis  noted of thoracic or lumbar spine.   Pulses:  R and L carotid,radial,femoral,dorsalis pedis and posterior tibial pulses are full and equal bilaterally Extremities:  No clubbing, cyanosis, edema, or deformity noted with normal full range of motion of all joints.   Neurologic:  No cranial nerve deficits noted. Station and gait are normal. Plantar reflexes are down-going bilaterally. DTRs are symmetrical throughout. Sensory, motor and coordinative functions appear intact.   PPM Specifications Following MD:  Lewayne Bunting, MD     PPM Vendor:  GUIDANT     PPM Model Number:  [id number]     PPM Serial Number:  [id number] PPM DOI:  02/12/2007     PPM Implanting MD:  Lewayne Bunting, MD  Lead 1    Location: RA     DOI: 02/12/2007     Model #: [id number]     Serial #: [id number]     Status: active Lead 2    Location: RV     DOI: 02/12/2007     Model #: [id number]     Serial #: [id number]     Status: active  Magnet Response Rate:  BOL 100 ERI 85  Indications:  Tachy brady syndrome; A-fib   PPM Follow Up Battery Voltage:  GOOD V     Battery Est. Longevity:  >5 YRS     Pacer Dependent:  No       PPM Device Measurements Atrium  Amplitude: 1.5 mV, Impedance: 580 ohms, Threshold: 1.0 V at 0.4 msec Right Ventricle  Amplitude: 12 mV, Impedance: 650 ohms, Threshold: 0.7 V at 0.4 msec  Episodes MS Episodes:  874     Percent Mode Switch:  15%     Coumadin:  Yes Ventricular High Rate:  0     Atrial Pacing:  14%     Ventricular Pacing:  10%  Parameters Mode:  DDDR     Lower Rate Limit:  60     Upper Rate Limit:  120 Paced AV Delay:  300     Sensed AV Delay:  260 Next Cardiology Appt Due:  07/16/2010 Tech Comments:  874 MODE SWITCHES--AVERAGE TIME 50 MINUTES. NORMAL DEVICE FUNCTION.  NO CHANGES MADE. ROV IN 6 MTHS W/DEVICE CLINIC AND ROV 12 MTHS W/GT. Vella Kohler  January 30, 2010 3:21 PM  Impression & Recommendations:  Problem # 1:  CARDIAC PACEMAKER IN SITU (ICD-V45.01) His device is working normally.  Will recheck in  several months.  Problem # 2:  ATRIAL FIBRILLATION (ICD-427.31) He is asymptomatic and his rates appear to be fairly well controlled.  Will continue current meds. The following medications were removed from the medication list:    Lopressor 50 Mg Tabs (Metoprolol tartrate) .Marland Kitchen... 1 by mouth bid His updated medication list for this problem includes:    Aspirin 81 Mg Tbec (Aspirin) .Marland Kitchen... Take 1 tablet by mouth every morning    Warfarin Sodium 5 Mg Tabs (Warfarin sodium) .Marland Kitchen... Take as directed by coumadin clinic.  Sotalol Hcl 80 Mg Tabs (Sotalol hcl) .Marland Kitchen... Take one tablet by mouth twice a day  Problem # 3:  CORONARY ARTERY DISEASE (ICD-414.00) He remains active and denies anginal symptoms. The following medications were removed from the medication list:    Lopressor 50 Mg Tabs (Metoprolol tartrate) .Marland Kitchen... 1 by mouth bid His updated medication list for this problem includes:    Aspirin 81 Mg Tbec (Aspirin) .Marland Kitchen... Take 1 tablet by mouth every morning    Warfarin Sodium 5 Mg Tabs (Warfarin sodium) .Marland Kitchen... Take as directed by coumadin clinic.    Sotalol Hcl 80 Mg Tabs (Sotalol hcl) .Marland Kitchen... Take one tablet by mouth twice a day    Nitrostat 0.4 Mg Subl (Nitroglycerin) .Marland Kitchen... 1 tablet under tongue at onset of chest pain; you may repeat every 5 minutes for up to 3 doses.    Amlodipine Besylate 10 Mg Tabs (Amlodipine besylate) .Marland Kitchen... Take one tablet by mouth daily  Patient Instructions: 1)  Your physician wants you to follow-up in:  6 months with device clinic and 12 months with Dr Court Joy will receive a reminder letter in the mail two months in advance. If you don't receive a letter, please call our office to schedule the follow-up appointment. Prescriptions: SOTALOL HCL 80 MG TABS (SOTALOL HCL) Take one tablet by mouth twice a day  #120 x 3   Entered by:   Dennis Bast, RN, BSN   Authorized by:   Laren Boom, MD, [facility]   Signed by:   Dennis Bast, RN, BSN on 01/30/2010   Method used:    Electronically to        [facility] (retail)       [address]       [place], [place]  [address]       Ph: [phone]       Fax: [phone]   RxID:   [id number] SOTALOL HCL 80 MG TABS (SOTALOL HCL) Take one tablet by mouth twice a day  #60 x 11   Entered by:   Francisco Merritt CMA   Authorized by:   Laren Boom, MD, [facility]   Signed by:   Francisco Merritt CMA on 01/30/2010   Method used:   Electronically to        [facility] (retail)       [address]       [place], [place]  [address]       Ph: [phone]       Fax: [phone]   RxID:   [id number]

## 2010-05-16 ENCOUNTER — Ambulatory Visit: Admit: 2010-05-16 | Payer: Self-pay

## 2010-05-16 ENCOUNTER — Encounter: Payer: Self-pay | Admitting: Internal Medicine

## 2010-05-16 ENCOUNTER — Encounter (INDEPENDENT_AMBULATORY_CARE_PROVIDER_SITE_OTHER): Payer: Medicare PPO

## 2010-05-16 DIAGNOSIS — I4891 Unspecified atrial fibrillation: Secondary | ICD-10-CM

## 2010-05-16 DIAGNOSIS — Z7901 Long term (current) use of anticoagulants: Secondary | ICD-10-CM

## 2010-05-16 LAB — CONVERTED CEMR LAB: POC INR: 2

## 2010-05-17 NOTE — Letter (Signed)
Summary: Anticoagulation Modification Letter  [facility]  [address] [place], [place] [address]   Phone: [phone]  Fax: [phone]    April 04, 2010  Re:    [name] DOB:    Apr 20, 1929 MRN:    [id number]    Dear Dr. Gala Romney,  We have scheduled the above patient for an endoscopic procedure. Our records show that  he/she is on anticoagulation therapy. Please advise as to how long the patient may come off their therapy of warfarin prior to the scheduled procedure(s) on 05/08/10.   Please fax back/or route the completed form to [place] at [phone].  Thank you for your help with this matter.  Sincerely,  Christie Nottingham CMA Duncan Dull)   Physician Recommendation:  Hold Plavix 5 days prior ________________  Hold Coumadin 5 days prior ____________  Other ______________________________     Appended Document: Anticoagulation Modification Letter Can hold coumadin as needed for endoscopy. Restart when completed.   Appended Document: Anticoagulation Modification Letter Pt notified to come off warfarin 5 days before his procedure. Pt verbalized understanding.

## 2010-05-17 NOTE — Medication Information (Signed)
Summary: rov coumadin  - [facility]  Anticoagulant Therapy  Managed by: Bethena Midget, RN, BSN Referring MD: Arvilla Meres PCP: Gordy Savers  MD Supervising MD: Cassell Clement Indication 1: Atrial Fibrillation (ICD-427.31) Lab Used: [facility] Site: [facility] INR POC 2.8 INR RANGE 2 - 3  Dietary changes: no    Health status changes: no    Bleeding/hemorrhagic complications: no    Recent/future hospitalizations: no    Any changes in medication regimen? yes       Details: Zantac 150mg s BID new med  Recent/future dental: no  Any missed doses?: no       Is patient compliant with meds? yes      Comments: Pending UGI on 05/08/10 clearance letter was sent to Dr Gala Romney   Allergies: 1)  ! Sulfa  Anticoagulation Management History:      The patient is taking warfarin and comes in today for a routine follow up visit.  Positive risk factors for bleeding include an age of 24 years or older.  The bleeding index is 'intermediate risk'.  Positive CHADS2 values include History of CHF, History of HTN, and Age > 43 years old.  The start date was 12/02/2006.  His last INR was 1.2 RATIO.  Anticoagulation responsible provider: Cassell Clement.  INR POC: 2.8.  Cuvette Lot#: [id number].  Exp: 05/2011.    Anticoagulation Management Assessment/Plan:      The patient's current anticoagulation dose is Warfarin sodium 5 mg  tabs: Take as directed by coumadin clinic..  The target INR is 2.0-3.0.  The next INR is due 05/16/2010.  Anticoagulation instructions were given to patient.  Results were reviewed/authorized by Bethena Midget, RN, BSN.  He was notified by Bethena Midget, RN, BSN.         Prior Anticoagulation Instructions: INR 2.8  Coumadin 5mg  tab - take 1 tab SUN and WED, 1/2 tab all other days  Current Anticoagulation Instructions: INR 2.8 Continue 2.5mg s everyday except 5mg s on Sundays and Wednesdays. Tentatively last dose on 05/02/10.  Recheck in 4 weeks.

## 2010-05-17 NOTE — Procedures (Addendum)
Summary: Upper Endoscopy w/DIL  Patient: Francisco Merritt Note: All result statuses are Final unless otherwise noted.  Tests: (1) Upper Endoscopy w/DIL (UED)  UED Upper Endoscopy w/DIL                             DONE     [facility]     [address]     [place], [place]  [address]           ENDOSCOPY PROCEDURE REPORT           PATIENT:  Francisco Merritt, Francisco Merritt  MR#:  [id number]     BIRTHDATE:  May 27, 1929, 80 yrs. old  GENDER:  male     ENDOSCOPIST:  Judie Petit T. Russella Dar, MD, [facility]           PROCEDURE DATE:  05/08/2010     PROCEDURE:  EGD with dilatation over guidewire,  with biopsy     ASA CLASS:  Class III     INDICATIONS:  1) dysphagia  2) GERD     MEDICATIONS:  Fentanyl 50 mcg IV, Versed 4 mg IV     TOPICAL ANESTHETIC:  Exactacain Spray     DESCRIPTION OF PROCEDURE:   After the risks benefits and     alternatives of the procedure were thoroughly explained, informed     consent was obtained.  The [id number] endoscope was     introduced through the mouth and advanced to the second portion of     the duodenum, without limitations.  The instrument was slowly     withdrawn as the mucosa was carefully examined.     <<PROCEDUREIMAGES>>     The esophagus and gastroesophageal junction were completely normal     in appearance.  Moderate gastritis was found in the antrum. It was     erosive and erythematous. Multiple biopsies were obtained and sent     to pathology. The duodenal bulb was normal in appearance, as was     the postbulbar duodenum.  The examination was otherwise normal.     Dilation was then performed at the total esophagus for dysphagia     without a stricture:           1) Dilator:  Savary over guidewire  Size:  17 mm     Resistance:  none  Heme:  none           COMPLICATIONS:  None           ENDOSCOPIC IMPRESSION:     1) Moderate gastritis in the antrum           RECOMMENDATIONS:     1) await pathology results     2) anti-reflux regimen long term     3)  PPI qam: omeprazole 20mg  po qam, #30, 11 refills and DC     ranitidine     4) avoid ASA/NSAIDs for at least 2 weeks     5) post dilation instructions     6) resume Coumadin today           Malcolm T. Russella Dar, MD, [facility]           CC:  Gordy Savers, MD           n.     Rosalie DoctorVenita Lick. Stark at 05/08/2010 02:04 PM           Polakowski, Jonny Ruiz, [phone]  Note: An exclamation mark Marland Kitchen)  indicates a result that was not dispersed into the flowsheet. Document Creation Date: 05/08/2010 2:05 PM _______________________________________________________________________  (1) Order result status: Final Collection or observation date-time: 05/08/2010 13:57 Requested date-time:  Receipt date-time:  Reported date-time:  Referring Physician:   Ordering Physician: Claudette Head [phone]) Specimen Source:  Source: Launa Grill Order Number: [id number] Lab site:

## 2010-05-17 NOTE — Assessment & Plan Note (Signed)
Summary: DYSPHAGIA/YF   History of Present Illness Visit Type: Initial Consult Primary GI MD: Elie Goody MD [facility] Primary Provider: Gordy Savers  MD Chief Complaint: dysphagia with solids History of Present Illness:   This is a 77 year old male that I have seen in the past for a bleeding duodenal ulcer associated with NSAID usage. Endoscopy and colonoscopy were performed in July 1999. He underwent ERCP, sphincterotomy and stone extraction by Dr. Marina Goodell 2008.  He relates a history of reflux symptoms occurring several times per week for several years and he takes Zantac intermittently. Over the past 9 months, he has had intermittent solid food dysphagia.   GI Review of Systems    Reports dysphagia with solids and  heartburn.      Denies abdominal pain, acid reflux, belching, bloating, chest pain, dysphagia with liquids, loss of appetite, nausea, vomiting, vomiting blood, weight loss, and  weight gain.        Denies anal fissure, black tarry stools, change in bowel habit, constipation, diarrhea, diverticulosis, fecal incontinence, heme positive stool, hemorrhoids, irritable bowel syndrome, jaundice, light color stool, liver problems, rectal bleeding, and  rectal pain.   Current Medications (verified): 1)  Aspirin 81 Mg Tbec (Aspirin) .... Take 1 Tablet By Mouth Every Morning 2)  Warfarin Sodium 5 Mg  Tabs (Warfarin Sodium) .... Take As Directed By Coumadin Clinic. 3)  Simvastatin 20 Mg  Tabs (Simvastatin) .Marland Kitchen.. 1 Once Daily 4)  Terazosin Hcl 10 Mg Caps (Terazosin Hcl) .... Take 1 Capsule By Mouth Once A Day 5)  Multivitamins  Tabs (Multiple Vitamin) .... Take 1 Tablet By Mouth Once A Day 6)  Vitamin C-Acerola 500 Mg Chew (Ascorbic Acid) .... Take 1 Tablet By Mouth Once A Day 7)  Glucosamine-Chondroitin 500-400 Mg Caps (Glucosamine-Chondroitin) .... Take 1 Tablet By Mouth Two Times A Day 8)  Tylenol Arthritis Pain 650 Mg Cr-Tabs (Acetaminophen) .... As Needed 9)  Androgel 50  Mg/5gm Gel (Testosterone) .... Use Q Am 10)  Sotalol Hcl 80 Mg Tabs (Sotalol Hcl) .... Take One Tablet By Mouth Twice A Day 11)  Potassium Chloride Crys Cr 20 Meq Cr-Tabs (Potassium Chloride Crys Cr) .... Take One Tablet By Mouth Daily 12)  Nitrostat 0.4 Mg Subl (Nitroglycerin) .Marland Kitchen.. 1 Tablet Under Tongue At Onset of Chest Pain; You May Repeat Every 5 Minutes For Up To 3 Doses. 13)  Furosemide 20 Mg Tabs (Furosemide) .... Take One Tablet By Mouth Daily. 14)  Amlodipine Besylate 10 Mg Tabs (Amlodipine Besylate) .... Take One Tablet By Mouth Daily  Allergies (verified): 1)  ! Sulfa  Past History:  Family History: Last updated: June 05, 2008 father died at 43 of an MI.  Mother died at 29 of hypertension, coronary disease, and palpitations of diabetes two brothers, positive for diabetes, hypertension, coronary artery disease  Social History: Last updated: 2008/06/05  He lives [place] in [place].  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   No tobacco or alcohol use.   Past Medical History:  1. Paroxysmal atrial fibrillation with tachybrady syndrome.       --s/p Guidant Insignia pacemaker      --stqarted sotalol 3/11  2. One vessel coronary artery disease with normal ejection fraction.       --Cath 9/08. EF normal. LM nl, LAD 40%, D2 occulded with collaterals, LCX 40%, RCA 30%      --Echo 11/10 EF 50-55% grade I diastolic dysfunction.  3. History of gallstones status post cholecystectomy.   4. Hypertension.   5. Hyperlipidemia.   6. History of peptic ulcer disease.   7. Osteoarthritis  8. Benign prostatic hypertrophy  9. Obesity 10.Nephrolithiasis, hx of 11. Diverticulosis, colon 12. Testosterone deficiency 13. ED  Past Surgical History: Cholecystectomy status post pacemaker carotid artery  cardiac catheterization colonoscopy 1999 upper gastroduodenoscopy, 1999 Doppler evaluation  Review of Systems       The patient  complains of arthritis/joint pain, back pain, fatigue, hearing problems, heart rhythm changes, and urination - excessive.         The pertinent positives and negatives are noted as above and in the HPI. All other ROS were reviewed and were negative.   Vital Signs:  Patient profile:   77 year old male Height:      69 inches Weight:      219 pounds BMI:     32.46 BSA:     2.15 Pulse rate:   80 / minute Pulse rhythm:   regular BP sitting:   100 / 60  (left arm)  Vitals Entered By: Merri Ray CMA Duncan Dull) (April 04, 2010 9:08 AM)  Physical Exam  General:  Well developed, well nourished, no acute distress. Head:  Normocephalic and atraumatic. Eyes:  PERRLA, no icterus. Ears:  Normal auditory acuity. Mouth:  No deformity or lesions, dentition normal. Neck:  Supple; no masses or thyromegaly. Lungs:  Clear throughout to auscultation. Heart:  Regular rate and rhythm; no murmurs, rubs,  or bruits. Abdomen:  Soft, nontender and nondistended. No masses, hepatosplenomegaly or hernias noted. Normal bowel sounds. Msk:  Symmetrical with no gross deformities. Normal posture. Pulses:  Normal pulses noted. Extremities:  No clubbing, cyanosis, edema or deformities noted. Neurologic:  Alert and  oriented x4;  grossly normal neurologically. Cervical Nodes:  No significant cervical adenopathy. Axillary Nodes:  No significant axillary adenopathy. Psych:  Alert and cooperative. Normal mood and affect.  Impression & Recommendations:  Problem # 1:  DYSPHAGIA UNSPECIFIED (ICD-787.20) Intermittent solid food dysphagia. Suspected peptic stricture. Zantac twice daily for now. Likely will change to a PPI following endoscopy. Begin standard antireflux measures. The risks, benefits and alternatives to endoscopy with possible biopsy and possible dilation were discussed with the patient and they consent to proceed. The procedure will be scheduled electively. Orders: EGD SAV (EGD SAV)  Problem # 2:   GERD (ICD-530.81) See above.  Problem # 3:  ANTICOAGULATION THERAPY (ICD-V58.61) The risks, benefits, and alternatives to 5 day hold of Coumadin were discussed with the patient. He consents to proceed. Obtain clearance from  his cardiologist.  Patient Instructions: 1)  Take Zantac one tablet by mouth two times a day until Upper Endoscoy.  2)  Upper Endoscopy with Dilatation brochure given.  3)  Avoid foods high in acid content ( tomatoes, citrus juices, spicy foods) . Avoid eating within 3 to 4 hours of lying down or before exercising. Do not over eat; try smaller more frequent meals. Elevate head of bed four inches when sleeping.  4)  Copy sent to : Arvilla Meres, MD 5)                         Gordy Savers  MD 6)  The medication list was reviewed and reconciled.  All changed / newly prescribed medications were explained.  A complete medication list was provided to the patient / caregiver.

## 2010-05-17 NOTE — Op Note (Signed)
Summary: Laparoscopic Cholecystectomy  NAME:  Francisco Merritt, Francisco Merritt              ACCOUNT NO.:  [id number]      MEDICAL RECORD NO.:  [id number]          PATIENT TYPE:  INP      LOCATION:  1334                         FACILITY:  [facility]      PHYSICIAN:  Adolph Pollack, M.D.DATE OF BIRTH:  May 19, 1929      DATE OF PROCEDURE:  11/29/2006   DATE OF DISCHARGE:                                  OPERATIVE REPORT      PREOPERATIVE DIAGNOSIS:  Choledocholithiasis with cholelithiasis.      POSTOPERATIVE DIAGNOSIS:  Choledocholithiasis with cholelithiasis and   chronic cholecystitis.      PROCEDURE:  Laparoscopic cholecystectomy with intraoperative   cholangiogram.      SURGEON:  Adolph Pollack, M.D.      ASSISTANT:  Angelia Mould. Derrell Lolling, M.D.      ANESTHESIA:  General.      INDICATIONS:  This 77 year old male has been feeling poorly and had   weight loss and was found to have findings consistent with common duct   stones in that he had elevated liver function tests and CT scan   demonstrated dilated ducts and probable stones in the bile duct.  He was   admitted and underwent ERCP yesterday which he tolerated well and free   stones were evacuated.  He had tolerated the ERCP well and is now   brought to the operating room for cholecystectomy.  We discussed the   procedure and risks preoperatively.      TECHNIQUE:  He was brought to the operating room, placed supine on the   operating table and had a general anesthetic administered.  His   abdominal wall hair was clipped and the area sterilely prepped and   draped.  Dilute Marcaine solution was infiltrated in the subumbilical   region and a small subumbilical incision was made through skin and   subcutaneous tissue. The midline fascia was identified and small   incision made in the midline fascia and peritoneum, entered the   peritoneal cavity under direct vision.  The pursestring suture of 0   Vicryl  was placed around the fascial edges.  A  Hassan trocar was   introduced to the peritoneal cavity and pneumoperitoneum was created by   insufflation of CO2 gas.      Following this, a laparoscope was introduced.  She was then placed in   the reverse Trendelenburg position, right side tilted slightly up.  An   11 mm trocar was placed in the epigastric incision and two 5 mm trocars   were placed in the right upper quadrant.  The gallbladder was very firm   and thickened, consistent with chronic inflammatory changes.  I tried to   decompress this with a needle with did not get much out.  However, after   this we were able to grasp the gallbladder a little bit better.   Subsequently the fundus was retracted toward the right shoulder   infundibulum was grasped using very careful blunt dissection.  I was   able to immobilize the infundibulum.  I  identified what appeared to be   the cystic duct/gallbladder junction, create a window around this, and   also noted a cystic duct lymph node anterior to the cystic duct artery.   I create a window around the cystic duct artery.  This was clipped and   divided allowing for a larger window around what appeared to be a   dilated-type cystic duct.  I placed a clip at the cystic   duct/gallbladder junction, made a small incision and milked some sludge   and debris over the cystic duct.  A cholangiocath was then passed   through the anterior abdominal wall to the cystic duct and cholangiogram   was performed.      Under real time fluoroscopy, dilute contrast was injected to the cystic   duct which was of moderate length and relatively small caliber with   respect to the lumen.  Hepatic and common bile ducts all filled.   Appeared to be debris in the common bile duct.  Contrast drained   promptly into the duodenum without hesitation.  Final reports pending   radiologist interpretation.      The cholangiocath was removed.  I then clipped the cystic duct and   divided and then placed two Endoloops  on the cystic duct as well.  The   gallbladder was then dissected free from the liver using electrocautery   and part of it was intrahepatic, exposing a fair amount of the liver   around the fundus.  I placed the gallbladder in the Endopouch bag and   controlled bleeding with electrocautery.  I then placed Surgicel on the   gallbladder fossa, copiously irrigated out of the area prior to Surgicel   placement.  There was good hemostasis and no evidence of bile leak.      A 19 Blake drain was placed into the right upper quadrant, exited out   the lateral 5 mm trocar site and anchored to the skin.  A drain was   placed in the gallbladder fossa.  I then evacuated as much irrigation   fluid as possible and removed the gallbladder and the Endopouch bag   through the subumbilical port.  The subumbilical fascial defect was   closed under laparoscopic vision by tightening up and tying down the   pursestring suture.  The remaining trocars were removed and the skin   incisions closed with four Monocryl subcuticular stitches.  Sterile   dressings were applied.      He tolerated the procedure well without any apparent complications and   was taken recovery in satisfactory condition.               Adolph Pollack, M.D.   Electronically Signed            TJR/MEDQ  D:  11/29/2006  T:  11/30/2006  Job:  [id number]      cc:   Gordy Savers, MD   [address] [place]   [place] [address]      Wilhemina Bonito. Marina Goodell, MD   [address]   [place]   [place] [address]        SP-Surgical Pathology - STATUS: Final  .                                         Perform Date: 16Aug08 00:01  Ordered By:  Rosenbower Md , Wynetta Fines Date: 18Aug08 09:53  Facility: [facility]                              Department: CPATH  Service Report Text  [facility]   [address] [place], [place] [address]   [phone]    REPORT OF SURGICAL PATHOLOGY    Case #:  [id number]   Patient Name: Francisco Merritt, Francisco Merritt   Office Chart Number: N/A    MRN: [id number]   Pathologist: H. Hollice Espy, MD   DOB/Age 10-May-1929 (Age: 77) Gender: M   Date Taken: 11/29/2006   Date Received: 11/30/2006    FINAL DIAGNOSIS    ***MICROSCOPIC EXAMINATION AND DIAGNOSIS***    GALLBLADDER, CHOLECYSTECTOMY:   - CHRONIC CHOLECYSTITIS.   - UNREMARKABLE HEPATIC PARENCHYMA.    mw   Date Reported: 12/02/2006 H. Hollice Espy, MD   *** Electronically Signed Out By [facility] ***    Clinical information   Choledocholithiasis, cholelithiasis. (ajc)    specimen(s) obtained   Gallbladder    Gross Description   Size/?Intact: 5.9 cm in length x 2 cm in width, partially opened   gallbladder received in formalin.   Serosal surface: Smooth tan-green.   Mucosa/Wall: Slightly granular tan-red. The wall measures up to   0.7 cm thick.   Contents: A moderate amount of thick reddish-brown sludge-like   material. No distinct choleliths identified.   Cystic duct: Patent, measuring 0.1 cm in diameter.   Block Summary: Three representative sections are submitted in   one block. (SP:gt, 12/01/06)    gdt/   Additional Information  HL7 RESULT STATUS : F  External IF Update Timestamp : 2006-12-01:09:56:00.000000

## 2010-05-17 NOTE — Op Note (Signed)
Summary: ERCP  NAME:  Francisco Merritt, Francisco Merritt              ACCOUNT NO.:  [id number]      MEDICAL RECORD NO.:  [id number]          PATIENT TYPE:  AMB      LOCATION:  ENDO                         FACILITY:  [facility]      PHYSICIAN:  Wilhemina Bonito. Marina Goodell, MD      DATE OF BIRTH:  1930/03/08      DATE OF PROCEDURE:  11/28/2006   DATE OF DISCHARGE:                                  OPERATIVE REPORT      PROCEDURE PERFORMED:  Endoscopic retrograde cholangiography with biliary   sphincterotomy and common bile duct stone extraction.      ENDOSCOPIST:  Wilhemina Bonito. Marina Goodell, MD.      INDICATIONS:  Abdominal discomfort, weight loss, abnormal CT scan, and   abnormal liver function tests.      HISTORY:  This is a 77 year old gentleman with a history of   hypertension, who was undergoing an insurance physical about three   months ago.  He was found to have abnormal liver tests and referred to   Dr. Rene Kocher.  A CT scan of the abdomen and pelvis obtained   yesterday revealed dilation of intrahepatic and extrahepatic bile ducts   with a question of common duct stone.  As well, mild dilation of the   pancreatic duct.  No discrete lesion noted in the pancreas.  He was   admitted to the hospital for the purposes of ERCP with a possible   sphincterotomy, stone extraction, or stent placement depending upon the   findings.  The nature of the procedure as well as its risks, benefits,   and alternatives were reviewed carefully and in detail.  He understood   and agreed to proceed.      PHYSICAL EXAMINATION:  GENERAL:  Anxious but otherwise well-appearing   male in no acute distress, he is alert and oriented.   VITAL SIGNS:  Stable.   LUNGS:  Clear.   HEART:  Regular.   ABDOMEN:  Soft with minimal epigastric tenderness to palpation, good   bowel sounds heard.      PROCEDURE:  After informed consent was obtained, the patient was sedated   with 120 mcg of Fentanyl and 11 mg of Versed IV over the course of the   procedure.   As well, Glucagon 0.5 mg IV was given as a duodenal   relaxant.  Preoperative antibiotics were also provided.  The Pentax side-   viewing duodenoscope was then passed blindly into the esophagus.  The   stomach had some retained liquid gastric contents presumably from   breakfast.  No gross abnormalities of the stomach.  The duodenal bulb   was normal.  The postbulbar duodenum revealed moderate-sized   periampullary diverticulum.  Amidst the diverticulum was a normal-   appearing ampulla.      X-RAY FINDINGS:   1. Scout radiograph of the abdomen with the endoscope in position       revealed rib calcifications as well as some residual contrast       material from previous CT.   2. The common  bile duct was selectively and deeply cannulated.       Filling of the bile duct revealed diffuse dilation throughout with       a maximal ductal diameter estimated to be 18 mm.  Within the duct       were several mobile-filling defects consistent with stones.  These       measured 15 mm, 10 mm, and 5 mm.  As well, there appeared to be       small stones in the cystic duct.  THERAPY:  With a hydrophilic       guidewire in the proximal biliary tree, a biliary sphincterotomy       was made with cutting in the 12 o'clock orientation via the ERBE       System.  The bottom of the duct was generous-sized as was the       subsequent sphincterotomy.  After completing the sphincterotomy,       the cutting cannula was exchanged for a sequential balloon.  With       the balloon at 15 mm, all three stones were delivered into the       duodenum.  Post extraction occlusion cholangiogram revealed no       residual filling defects in the duct proper.  There still appeared       to be some small defects in the cystic duct.   3. No attempt at a pancreatogram was made.      IMPRESSION:  Symptomatic choledocholithiasis status post ERC with   sphincterotomy and stone extraction.      RECOMMENDATIONS:   1. Admit the  patient for observation.   2. Continue antibiotics.   3. Surgical consultation for a laparoscopic cholecystectomy.               Wilhemina Bonito. Marina Goodell, MD   Electronically Signed            JNP/MEDQ  D:  11/28/2006  T:  11/28/2006  Job:  [id number]      cc:   Gordy Savers, MD   [address] [place]   [place] [address]

## 2010-05-17 NOTE — Letter (Signed)
Summary: EGD Instructions  [facility]  [address] [place], [place] [address]   Phone: [phone]  Fax: [phone]       Francisco Merritt    1929/10/17    MRN: [id number]       Procedure Day /Date: Tuesday January 24th, 2012     Arrival Time:  12:30pm     Procedure Time: 1:30pm     Location of Procedure:                    _  x_ [facility] (4th Floor)    PREPARATION FOR ENDOSCOPY   On 05/08/10 THE DAY OF THE PROCEDURE:  1.   No solid foods, milk or milk products are allowed after midnight the night before your procedure.  2.   Do not drink anything colored red or purple.  Avoid juices with pulp.  No orange juice.  3.  You may drink clear liquids until11:30am, which is 2 hours before your procedure.                                                                                                CLEAR LIQUIDS INCLUDE: Water Jello Ice Popsicles Tea (sugar ok, no milk/cream) Powdered fruit flavored drinks Coffee (sugar ok, no milk/cream) Gatorade Juice: apple, white grape, white cranberry  Lemonade Clear bullion, consomm, broth Carbonated beverages (any kind) Strained chicken noodle soup Hard Candy   MEDICATION INSTRUCTIONS  Unless otherwise instructed, you should take regular prescription medications with a small sip of water as early as possible the morning of your procedure.   Stop taking Coumadin on  _  (5 days before procedure).  Additional medication instructions: You will be contaced by our office prior to your procedure for directions on holding your Coumadin/Warfarin.  If you do not hear from our office 1 week prior to your scheduled procedure, please call [phone] to discuss.             OTHER INSTRUCTIONS  You will need a responsible adult at least 75 years of age to accompany you and drive you home.   This person must remain in the waiting room during your procedure.  Wear loose fitting clothing that is easily  removed.  Leave jewelry and other valuables at home.  However, you may wish to bring a book to read or an iPod/MP3 player to listen to music as you wait for your procedure to start.  Remove all body piercing jewelry and leave at home.  Total time from sign-in until discharge is approximately 2-3 hours.  You should go home directly after your procedure and rest.  You can resume normal activities the day after your procedure.  The day of your procedure you should not:   Drive   Make legal decisions   Operate machinery   Drink alcohol   Return to work  You will receive specific instructions about eating, activities and medications before you leave.    The above instructions have been reviewed and explained to me by   Marchelle Folks.     I fully understand  and can verbalize these instructions _____________________________ Date _________

## 2010-05-17 NOTE — Miscellaneous (Signed)
Summary: rx omeprazole  Clinical Lists Changes  Medications: Added new medication of OMEPRAZOLE 20 MG CPDR (OMEPRAZOLE) one by mouth every am - Signed Rx of OMEPRAZOLE 20 MG CPDR (OMEPRAZOLE) one by mouth every am;  #30 x 11;  Signed;  Entered by: Weston Brass RN;  Authorized by: Meryl Dare MD [facility];  Method used: Electronically to [facility] [address] [place], [place], [place]  [address], Ph: [phone], Fax: [phone] Observations: Added new observation of ALLERGY REV: Done (05/08/2010 14:21)    Prescriptions: OMEPRAZOLE 20 MG CPDR (OMEPRAZOLE) one by mouth every am  #30 x 11   Entered by:   Weston Brass RN   Authorized by:   Meryl Dare MD [facility]   Signed by:   Weston Brass RN on 05/08/2010   Method used:   Electronically to        [facility] (retail)       [address]       [place], [place]  [address]       Ph: [phone]       Fax: [phone]   RxID:   [id number]

## 2010-05-17 NOTE — Procedures (Signed)
Summary: EGD/[place]  EGD/[place]   Imported By: Sherian Rein 04/04/2010 13:50:01  _____________________________________________________________________  External Attachment:    Type:   Image     Comment:   External Document

## 2010-05-17 NOTE — Procedures (Signed)
Summary: Colonoscopy/[facility]  Colonoscopy/[place]   Imported By: Sherian Rein 04/04/2010 13:51:10  _____________________________________________________________________  External Attachment:    Type:   Image     Comment:   External Document

## 2010-05-18 ENCOUNTER — Other Ambulatory Visit: Payer: Self-pay | Admitting: Physician Assistant

## 2010-05-18 ENCOUNTER — Ambulatory Visit (INDEPENDENT_AMBULATORY_CARE_PROVIDER_SITE_OTHER): Payer: Medicare PPO | Admitting: Physician Assistant

## 2010-05-18 ENCOUNTER — Encounter: Payer: Self-pay | Admitting: Physician Assistant

## 2010-05-18 DIAGNOSIS — R5383 Other fatigue: Secondary | ICD-10-CM

## 2010-05-18 DIAGNOSIS — I4891 Unspecified atrial fibrillation: Secondary | ICD-10-CM

## 2010-05-18 LAB — CBC WITH DIFFERENTIAL/PLATELET
Eosinophils Relative: 5.5 % — ABNORMAL HIGH (ref 0.0–5.0)
HCT: 46.5 % (ref 39.0–52.0)
Hemoglobin: 16 g/dL (ref 13.0–17.0)
Lymphs Abs: 1.3 10*3/uL (ref 0.7–4.0)
MCV: 99.6 fl (ref 78.0–100.0)
Monocytes Relative: 13.6 % — ABNORMAL HIGH (ref 3.0–12.0)
Neutro Abs: 5.4 10*3/uL (ref 1.4–7.7)
WBC: 8.3 10*3/uL (ref 4.5–10.5)

## 2010-05-18 LAB — BASIC METABOLIC PANEL
BUN: 18 mg/dL (ref 6–23)
CO2: 28 mEq/L (ref 19–32)
Calcium: 9.6 mg/dL (ref 8.4–10.5)
Creatinine, Ser: 1.3 mg/dL (ref 0.4–1.5)
Glucose, Bld: 87 mg/dL (ref 70–99)

## 2010-05-18 LAB — HEPATIC FUNCTION PANEL
Albumin: 4 g/dL (ref 3.5–5.2)
Total Protein: 7.4 g/dL (ref 6.0–8.3)

## 2010-05-18 LAB — TSH: TSH: 1.44 u[IU]/mL (ref 0.35–5.50)

## 2010-05-23 NOTE — Assessment & Plan Note (Signed)
Summary: Fatigue   Vital Signs:  Patient profile:   77 year old male Height:      69 inches Weight:      221.50 pounds BMI:     32.83 Pulse rate:   64 / minute BP sitting:   130 / 78  (left arm)  Vitals Entered By: Celestia Khat, CMA (May 18, 2010 9:32 AM)  Visit Type:  Follow-up Referring Provider:  Gordy Savers  MD Primary Provider:  Gordy Savers  MD  CC:  no energy, unable to have a sex life, and feels weak.Marland Kitchen  History of Present Illness: Primary Cardiologist:  Dr. Arvilla Meres Primary Electrophysiologist:  Dr. Lewayne Bunting  Francisco Merritt is a delightful 77yo male with a history of obesity, hypertension, one-vessel coronary artery disease with a totally occluded diagonal branch by catheterization. He also history of atrial fibrillation, complicated by tachybradycardia syndrome and is status post pacemaker implantation and takes coumadin.  He is also on sotalol therapy.  His beta blocker was discontinued recently and his diltiazem was changed to amlodipine due to low heart rates.   He presents today with complaints of fatigue.  This has been ongoing for several months.  He saw his primary care provider some time ago and was placed on testosterone replacement due to low testosterone levels.  He seems to feel like his fatigue got worse after being admitted to the hospital in March of 2011.  At that time, he was admitted for acute on chronic diastolic heart failure in the setting of recurrent atrial fibrillation.  He was placed on sotalol at that time to maintain sinus rhythm.  He states that his testosterone prescription was titrated up recently without significant success.  He is having difficulty with erectile dysfunction.  He suspects his fatigue is caused by his medications.  He asked his pharmacist and they told him that either sotalol or amlodipine could cause his problems.  He denies chest pain, shortness of breath, syncope, orthopnea, PND or significant pedal  edema.  Current Medications (verified): 1)  Aspirin 81 Mg Tbec (Aspirin) .... Take 1 Tablet By Mouth Every Morning. On Hold Per Dr. Russella Dar Until 05-22-10 2)  Warfarin Sodium 5 Mg  Tabs (Warfarin Sodium) .... Take As Directed By Coumadin Clinic. 3)  Simvastatin 20 Mg  Tabs (Simvastatin) .Marland Kitchen.. 1 Once Daily 4)  Terazosin Hcl 10 Mg Caps (Terazosin Hcl) .... Take 1 Capsule By Mouth Once A Day 5)  Multivitamins  Tabs (Multiple Vitamin) .... Take 1 Tablet By Mouth Once A Day 6)  Vitamin C-Acerola 500 Mg Chew (Ascorbic Acid) .... Take 1 Tablet By Mouth Once A Day 7)  Glucosamine-Chondroitin 500-400 Mg Caps (Glucosamine-Chondroitin) .... Take 1 Tablet By Mouth Two Times A Day 8)  Tylenol Arthritis Pain 650 Mg Cr-Tabs (Acetaminophen) .... As Needed 9)  Sotalol Hcl 80 Mg Tabs (Sotalol Hcl) .... Take One Tablet By Mouth Twice A Day 10)  Potassium Chloride Crys Cr 20 Meq Cr-Tabs (Potassium Chloride Crys Cr) .... Take One Tablet By Mouth Daily 11)  Nitrostat 0.4 Mg Subl (Nitroglycerin) .Marland Kitchen.. 1 Tablet Under Tongue At Onset of Chest Pain; You May Repeat Every 5 Minutes For Up To 3 Doses. 12)  Furosemide 20 Mg Tabs (Furosemide) .... Take One Tablet By Mouth Daily. 13)  Amlodipine Besylate 10 Mg Tabs (Amlodipine Besylate) .... Take One Tablet By Mouth Daily 14)  Omeprazole 20 Mg Cpdr (Omeprazole) .... One By Mouth Every Am  Allergies (verified): 1)  ! Sulfa  Past History:  Past Medical History: Last updated: 04/04/2010  1. Paroxysmal atrial fibrillation with tachybrady syndrome.       --s/p [employer] Insignia pacemaker      --stqarted sotalol 3/11  2. One vessel coronary artery disease with normal ejection fraction.       --Cath 9/08. EF normal. LM nl, LAD 40%, D2 occulded with collaterals, LCX 40%, RCA 30%      --Echo 11/10 EF 50-55% grade I diastolic dysfunction.   3. History of gallstones status post cholecystectomy.   4. Hypertension.   5. Hyperlipidemia.   6. History of peptic ulcer disease.   7.  Osteoarthritis  8. Benign prostatic hypertrophy  9. Obesity 10.Nephrolithiasis, hx of 11. Diverticulosis, colon 12. Testosterone deficiency 13. ED  Past Surgical History: Last updated: 04/04/2010 Cholecystectomy status post pacemaker carotid artery  cardiac catheterization colonoscopy 1999 upper gastroduodenoscopy, 1999 Doppler evaluation  Review of Systems       As per  the HPI.  All other systems reviewed and negative.   Physical Exam  General:  Well nourished, well developed, in no acute distress HEENT: normal Neck: no JVD Cardiac:  normal S1, S2; RRR; no murmur Lungs:  clear to auscultation bilaterally, no wheezing, rhonchi or rales Abd: soft, nontender, no hepatomegaly Ext: trace bilat edema Endo: no thyromegaly Skin: warm and dry Neuro:  CNs 2-12 intact, no focal abnormalities noted    Impression & Recommendations:  Problem # 1:  FATIGUE / MALAISE (ICD-780.79) His symptoms may all be related to his sotalol.  Please see the discussion below.  He will have labs today that includes a CBC, basic metabolic panel, LFTs and TSH to rule out other causes.  He also has a h/o testosterone deficiency.    Problem # 2:  ATRIAL FIBRILLATION (ICD-427.31) I discussed his case today with Dr. Gala Romney.  His other options for rhythm control would include amiodarone or Tikosyn.  At this point in time, we feel that Tikosyn would be his best option.  I discussed with him stopping his sotalol today and coming into the hospital early next week for a Tikonsyn load.  He does not want to go into the hospital at this time.  He would like to pursue the labwork as above, discuss further treatment of his testosterone with his primary care provider and then followup with Dr. Gala Romney in the next couple of weeks to discuss this further.  Other Orders: TLB-CBC Platelet - w/Differential (85025-CBCD) TLB-BMP (Basic Metabolic Panel-BMET) (80048-METABOL) TLB-Hepatic/Liver Function Pnl  (80076-HEPATIC) TLB-TSH (Thyroid Stimulating Hormone) ([address]-UEA)  Patient Instructions: 1)  Your physician recommends that you schedule a follow-up appointment in: f/u with dr bensimhon in 2-3 weeks 2)  Your physician recommends that you return for lab work in: today--BMET,CBC,LFT'S TSH   Orders Added: 1)  TLB-CBC Platelet - w/Differential [85025-CBCD] 2)  TLB-BMP (Basic Metabolic Panel-BMET) [80048-METABOL] 3)  TLB-Hepatic/Liver Function Pnl [80076-HEPATIC] 4)  TLB-TSH (Thyroid Stimulating Hormone) [84443-TSH]     EKG  Procedure date:  05/18/2010  Findings:      normal sinus rhythm Heart rate 62 Left axis deviation Nonspecific ST-T wave changes

## 2010-05-23 NOTE — Medication Information (Signed)
Summary: rov/kh   Anticoagulant Therapy  Managed by: Tammy Sours, PharmD Referring MD: Arvilla Meres PCP: Gordy Savers  MD Supervising MD: Ladona Ridgel MD, Sharlot Gowda Indication 1: Atrial Fibrillation (ICD-427.31) Lab Used: [facility] Site: [facility] INR POC 2 INR RANGE 2 - 3  Dietary changes: no    Health status changes: no    Bleeding/hemorrhagic complications: no    Recent/future hospitalizations: no    Any changes in medication regimen? yes       Details: pt. now taking omeprazole, also pt. to restart ASA on 05/22/10  Recent/future dental: no  Any missed doses?: no       Is patient compliant with meds? yes       Allergies: 1)  ! Sulfa  Anticoagulation Management History:      The patient is taking warfarin and comes in today for a routine follow up visit.  Positive risk factors for bleeding include an age of 27 years or older.  The bleeding index is 'intermediate risk'.  Positive CHADS2 values include History of CHF, History of HTN, and Age > 48 years old.  The start date was 12/02/2006.  His last INR was 1.2 RATIO.  Anticoagulation responsible provider: Ladona Ridgel MD, Sharlot Gowda.  INR POC: 2.  Cuvette Lot#: [id number].  Exp: 05/2011.    Anticoagulation Management Assessment/Plan:      The patient's current anticoagulation dose is Warfarin sodium 5 mg  tabs: Take as directed by coumadin clinic..  The target INR is 2.0-3.0.  The next INR is due 06/13/2010.  Anticoagulation instructions were given to patient.  Results were reviewed/authorized by Tammy Sours, PharmD.         Prior Anticoagulation Instructions: INR 2.8 Continue 2.5mg s everyday except 5mg s on Sundays and Wednesdays. Tentatively last dose on 05/02/10.  Recheck in 4 weeks.   Current Anticoagulation Instructions: INR 2  Patient recently restarted Coumadin 1 week ago after a procedure. Continue taking 1/2 tablet (2.5mg ) everday except for 1 tablet (5mg ) on Sundays and Wednesdays. Recheck INR in 4 weeks.

## 2010-05-23 NOTE — Letter (Signed)
Summary: Patient [facility] Biopsy Results  [facility]  [address] [place], [place] [address]   Phone: [phone]  Fax: [phone]        May 15, 2010 MRN: [id number]    [facility] [address] [place], [place]  [address]    Dear Mr. Ousley,  I am pleased to inform you that the biopsies taken during your recent endoscopic examination did not show any evidence of cancer upon pathologic examination. The biopsies showed chronic gastritis.  Continue with the treatment plan as outlined on the day of your      exam.  Please call us if you are having persistent problems or have questions about your condition that have not been fully answered at this time.  Sincerely,  Meryl Dare MD [facility]  This letter has been electronically signed by your physician.  Appended Document: Patient Notice-Endo Biopsy Results LETTER MAILED

## 2010-05-28 ENCOUNTER — Other Ambulatory Visit: Payer: Self-pay | Admitting: Internal Medicine

## 2010-06-02 DIAGNOSIS — Z7901 Long term (current) use of anticoagulants: Secondary | ICD-10-CM

## 2010-06-02 DIAGNOSIS — I4891 Unspecified atrial fibrillation: Secondary | ICD-10-CM

## 2010-06-07 ENCOUNTER — Encounter: Payer: Self-pay | Admitting: Internal Medicine

## 2010-06-07 ENCOUNTER — Ambulatory Visit (INDEPENDENT_AMBULATORY_CARE_PROVIDER_SITE_OTHER): Payer: Medicare PPO | Admitting: Internal Medicine

## 2010-06-07 DIAGNOSIS — R5381 Other malaise: Secondary | ICD-10-CM

## 2010-06-07 DIAGNOSIS — I2510 Atherosclerotic heart disease of native coronary artery without angina pectoris: Secondary | ICD-10-CM

## 2010-06-07 DIAGNOSIS — I4891 Unspecified atrial fibrillation: Secondary | ICD-10-CM

## 2010-06-07 DIAGNOSIS — R5383 Other fatigue: Secondary | ICD-10-CM

## 2010-06-12 ENCOUNTER — Encounter: Payer: Self-pay | Admitting: Internal Medicine

## 2010-06-12 DIAGNOSIS — I495 Sick sinus syndrome: Secondary | ICD-10-CM

## 2010-06-13 ENCOUNTER — Encounter (INDEPENDENT_AMBULATORY_CARE_PROVIDER_SITE_OTHER): Payer: Medicare PPO

## 2010-06-13 ENCOUNTER — Encounter: Payer: Self-pay | Admitting: Cardiovascular Disease

## 2010-06-13 DIAGNOSIS — Z7901 Long term (current) use of anticoagulants: Secondary | ICD-10-CM

## 2010-06-13 DIAGNOSIS — I4891 Unspecified atrial fibrillation: Secondary | ICD-10-CM

## 2010-06-13 LAB — CONVERTED CEMR LAB: POC INR: 2.5

## 2010-06-21 NOTE — Medication Information (Signed)
Summary: rov lmc  Anticoagulant Therapy  Managed by: Leota Sauers, PharmD, BCPS, CPP Referring MD: Arvilla Meres PCP: Gordy Savers  MD Supervising MD: Excell Seltzer MD, Casimiro Needle Indication 1: Atrial Fibrillation (ICD-427.31) Lab Used: [facility] Site: [facility] INR POC 2.5 INR RANGE 2 - 3  Dietary changes: no    Health status changes: no    Bleeding/hemorrhagic complications: no    Recent/future hospitalizations: no    Any changes in medication regimen? no    Recent/future dental: no  Any missed doses?: no       Is patient compliant with meds? yes       Current Medications (verified): 1)  Aspirin 81 Mg Tbec (Aspirin) .... Take 1 Tablet By Mouth Every Morning. On Hold Per Dr. Russella Dar Until 05-22-10 2)  Warfarin Sodium 5 Mg  Tabs (Warfarin Sodium) .... Take As Directed By Coumadin Clinic. 3)  Simvastatin 20 Mg  Tabs (Simvastatin) .Marland Kitchen.. 1 Once Daily 4)  Terazosin Hcl 10 Mg Caps (Terazosin Hcl) .... Take 1 Capsule By Mouth Once A Day 5)  Multivitamins  Tabs (Multiple Vitamin) .... Take 1 Tablet By Mouth Once A Day 6)  Vitamin C-Acerola 500 Mg Chew (Ascorbic Acid) .... Take 1 Tablet By Mouth Once A Day 7)  Glucosamine-Chondroitin 500-400 Mg Caps (Glucosamine-Chondroitin) .... Take 1 Tablet By Mouth Two Times A Day 8)  Tylenol Arthritis Pain 650 Mg Cr-Tabs (Acetaminophen) .... As Needed 9)  Sotalol Hcl 80 Mg Tabs (Sotalol Hcl) .... Take One Tablet By Mouth Twice A Day 10)  Potassium Chloride Crys Cr 20 Meq Cr-Tabs (Potassium Chloride Crys Cr) .... Take One Tablet By Mouth Daily 11)  Nitrostat 0.4 Mg Subl (Nitroglycerin) .Marland Kitchen.. 1 Tablet Under Tongue At Onset of Chest Pain; You May Repeat Every 5 Minutes For Up To 3 Doses. 12)  Furosemide 20 Mg Tabs (Furosemide) .... Take One Tablet By Mouth Daily. 13)  Amlodipine Besylate 10 Mg Tabs (Amlodipine Besylate) .... Take One Tablet By Mouth Daily 14)  Omeprazole 20 Mg Cpdr (Omeprazole) .... One By Mouth Every Am  Allergies: 1)  !  Sulfa  Anticoagulation Management History:      The patient is taking warfarin and comes in today for a routine follow up visit.  Positive risk factors for bleeding include an age of 76 years or older.  The bleeding index is 'intermediate risk'.  Positive CHADS2 values include History of CHF, History of HTN, and Age > 41 years old.  The start date was 12/02/2006.  His last INR was 1.2 RATIO.  Anticoagulation responsible provider: Excell Seltzer MD, Casimiro Needle.  INR POC: 2.5.  Cuvette Lot#: [id number].  Exp: 05/2011.    Anticoagulation Management Assessment/Plan:      The patient's current anticoagulation dose is Warfarin sodium 5 mg  tabs: Take as directed by coumadin clinic..  The target INR is 2.0-3.0.  The next INR is due 07/11/2010.  Anticoagulation instructions were given to patient.  Results were reviewed/authorized by Leota Sauers, PharmD, BCPS, CPP.         Prior Anticoagulation Instructions: INR 2  Patient recently restarted Coumadin 1 week ago after a procedure. Continue taking 1/2 tablet (2.5mg ) everday except for 1 tablet (5mg ) on Sundays and Wednesdays. Recheck INR in 4 weeks.   Current Anticoagulation Instructions: INR 2.5  Coumadin 5mg  tabs Take 1 tab on WED and SUN Take 1/2 tab all other days

## 2010-06-21 NOTE — Assessment & Plan Note (Signed)
Summary: F2-3 WKS PER CHECK OUT/SF      Allergies Added:   Visit Type:  Follow-up Referring Provider:  Gordy Savers  MD Primary Provider:  Gordy Savers  MD  CC:  no complaints.  History of Present Illness: Francisco Merritt is a delightful 77yo male with a history of obesity, hypertension, one-vessel coronary artery disease with a totally occluded diagonal branch by catheterization. He also history of atrial fibrillation, complicated by tachybradycardia syndrome and is status post pacemaker implantation and takes coumadin.  He is also on sotalol therapy.  His beta blocker was discontinued recently and his diltiazem was changed to amlodipine due to low heart rates.   He recently saw Tereso Newcomer in our office due ti persistent fatigue.  This has been ongoing for several months.  He saw his primary care provider some time ago and was placed on testosterone replacement due to low testosterone levels without much effect on his fatigue. When he saw Lorin Picket, we discussed switching Sotalol to Tikosyn to see if this would help resolve his fatigue. He refused. Bloodwork including CBC, TSH and BMET were all normal.   He returns for scheduled f/u. Says he feels so-so. Still feels rundown. Also complains of persistent erectile dysfunction despite using Cialis 20.  He denies palpitations, chest pain, shortness of breath, syncope, orthopnea, PND or significant pedal edema. Wife ordered an exercise machine and he has been walking on it 20-30 mins every other day. Has lost 5-7 pounds. Doesn't feel like his energy is getting better.   Problems Prior to Update: 1)  Gerd  (ICD-530.81) 2)  Dysphagia Unspecified  (ICD-787.20) 3)  Chronic Diastolic Heart Failure  (ICD-428.32) 4)  Health Screening  (ICD-V70.0) 5)  Bradycardia  (ICD-427.89) 6)  Encounter For Long-term Use of Other Medications  (ICD-V58.69) 7)  Coronary Atherosclerosis Native Coronary Artery  (ICD-414.01) 8)  Fatigue / Malaise  (ICD-780.79) 9)   Cardiac Pacemaker in Situ  (ICD-V45.01) 10)  Leg Pain, Bilateral  (ICD-729.5) 11)  Bruit  (ICD-785.9) 12)  Dyspnea  (ICD-786.05) 13)  Erectile Dysfunction  (ICD-302.72) 14)  Atrial Fibrillation  (ICD-427.31) 15)  Hyperlipidemia  (ICD-272.4) 16)  Erectile Dysfunction, Organic  (ICD-607.84) 17)  Anticoagulation Therapy  (ICD-V58.61) 18)  Coronary Artery Disease  (ICD-414.00) 19)  Hepatitis Nos  (ICD-573.3) 20)  Weight Loss, Abnormal  (ICD-783.21) 21)  Diverticulosis, Colon  (ICD-562.10) 22)  Nephrolithiasis, Hx of  (ICD-V13.01) 23)  Peptic Ulcer Disease  (ICD-533.90) 24)  Benign Prostatic Hypertrophy  (ICD-600.00) 25)  Osteoarthritis  (ICD-715.90) 26)  Hypertension  (ICD-401.9)  Current Medications (verified): 1)  Aspirin 81 Mg Tbec (Aspirin) .... Take 1 Tablet By Mouth Every Morning. On Hold Per Dr. Russella Dar Until 05-22-10 2)  Warfarin Sodium 5 Mg  Tabs (Warfarin Sodium) .... Take As Directed By Coumadin Clinic. 3)  Simvastatin 20 Mg  Tabs (Simvastatin) .Marland Kitchen.. 1 Once Daily 4)  Terazosin Hcl 10 Mg Caps (Terazosin Hcl) .... Take 1 Capsule By Mouth Once A Day 5)  Multivitamins  Tabs (Multiple Vitamin) .... Take 1 Tablet By Mouth Once A Day 6)  Vitamin C-Acerola 500 Mg Chew (Ascorbic Acid) .... Take 1 Tablet By Mouth Once A Day 7)  Glucosamine-Chondroitin 500-400 Mg Caps (Glucosamine-Chondroitin) .... Take 1 Tablet By Mouth Two Times A Day 8)  Tylenol Arthritis Pain 650 Mg Cr-Tabs (Acetaminophen) .... As Needed 9)  Sotalol Hcl 80 Mg Tabs (Sotalol Hcl) .... Take One Tablet By Mouth Twice A Day 10)  Potassium Chloride Crys Cr 20 Meq  Cr-Tabs (Potassium Chloride Crys Cr) .... Take One Tablet By Mouth Daily 11)  Nitrostat 0.4 Mg Subl (Nitroglycerin) .Marland Kitchen.. 1 Tablet Under Tongue At Onset of Chest Pain; You May Repeat Every 5 Minutes For Up To 3 Doses. 12)  Furosemide 20 Mg Tabs (Furosemide) .... Take One Tablet By Mouth Daily. 13)  Amlodipine Besylate 10 Mg Tabs (Amlodipine Besylate) .... Take One Tablet  By Mouth Daily 14)  Omeprazole 20 Mg Cpdr (Omeprazole) .... One By Mouth Every Am  Allergies (verified): 1)  ! Sulfa  Review of Systems       As per HPI and past medical history; otherwise all systems negative.   Vital Signs:  Patient profile:   77 year old male Height:      69 inches Weight:      216.50 pounds BMI:     32.09 Pulse rate:   65 / minute BP sitting:   112 / 70  (left arm) Cuff size:   regular  Vitals Entered By: Micki Riley CNA (June 07, 2010 2:04 PM)  Physical Exam  General:  Well nourished, well developed, in no acute distress HEENT: normal Neck: no JVD Cardiac:  normal S1, S2; RRR; no murmur Lungs:  clear to auscultation bilaterally, no wheezing, rhonchi or rales Abd: obese. soft, nontender, no hepatomegaly Ext: trace bilat edema Endo: no thyromegaly Skin: warm and dry Neuro:  CNs 2-12 intact, no focal abnormalities noted    PPM Specifications Following MD:  Lewayne Bunting, MD     PPM Vendor:  [employer]     PPM Model Number:  [id number]     PPM Serial Number:  [id number] PPM DOI:  02/12/2007     PPM Implanting MD:  Lewayne Bunting, MD  Lead 1    Location: RA     DOI: 02/12/2007     Model #: [id number]     Serial #: [id number]     Status: active Lead 2    Location: RV     DOI: 02/12/2007     Model #: [id number]     Serial #: [id number]     Status: active  Magnet Response Rate:  BOL 100 ERI 85  Indications:  Tachy brady syndrome; A-fib   PPM Follow Up Pacer Dependent:  No      Episodes Coumadin:  Yes  Parameters Mode:  DDDR     Lower Rate Limit:  60     Upper Rate Limit:  120 Paced AV Delay:  300     Sensed AV Delay:  260  Impression & Recommendations:  Problem # 1:  FATIGUE / MALAISE (ICD-780.79) We again discussed the possibility of switching sotalol to Tikosyn to see if this would help. Given the fact that he can exercise for 20-30 minutes, I am not sure that making that switch would make a huge difference. We have decided to wait a few more months and see if  his energy level improves with his exercise program.   Problem # 2:  ERECTILE DYSFUNCTION (ICD-302.72) Has failed Cialis will refer to urology.   Problem # 3:  CORONARY ATHEROSCLEROSIS NATIVE CORONARY ARTERY (ICD-414.01) Stable. No evidence of ischemia. Continue current regimen.  Problem # 4:  ATRIAL FIBRILLATION (ICD-427.31) Maintating SR on sotalol. As above, will continue current regimen including coumadin.   Problem # 5:  CHRONIC DIASTOLIC HEART FAILURE (ICD-428.32) Volume status looks good.  Continue current regimen.   Other Orders: Urology Referral (Urology)  Patient Instructions: 1)  You have been referred to  [facility] 2)  Follow up in 3 months.

## 2010-07-03 NOTE — Cardiovascular Report (Signed)
Summary: TTM   TTM   Imported By: Roderic Ovens 06/28/2010 15:43:54  _____________________________________________________________________  External Attachment:    Type:   Image     Comment:   External Document

## 2010-07-08 LAB — BASIC METABOLIC PANEL
BUN: 13 mg/dL (ref 6–23)
BUN: 20 mg/dL (ref 6–23)
CO2: 26 mEq/L (ref 19–32)
Calcium: 8.7 mg/dL (ref 8.4–10.5)
Calcium: 8.9 mg/dL (ref 8.4–10.5)
Chloride: 103 mEq/L (ref 96–112)
Creatinine, Ser: 1.35 mg/dL (ref 0.4–1.5)
Creatinine, Ser: 1.46 mg/dL (ref 0.4–1.5)
Creatinine, Ser: 1.6 mg/dL — ABNORMAL HIGH (ref 0.4–1.5)
GFR calc Af Amer: 51 mL/min — ABNORMAL LOW (ref >60)
GFR calc non Af Amer: 51 mL/min — ABNORMAL LOW (ref >60)
Glucose, Bld: 100 mg/dL — ABNORMAL HIGH (ref 70–99)
Glucose, Bld: 94 mg/dL (ref 70–99)
Sodium: 136 mEq/L (ref 135–145)

## 2010-07-08 LAB — MAGNESIUM: Magnesium: 2.2 mg/dL (ref 1.5–2.5)

## 2010-07-08 LAB — CBC
HCT: 42.1 % (ref 39.0–52.0)
MCV: 98.5 fL (ref 78.0–100.0)
Platelets: 220 10*3/uL (ref 150–400)
RDW: 13.9 % (ref 11.5–15.5)
WBC: 9.7 10*3/uL (ref 4.0–10.5)

## 2010-07-08 LAB — MRSA PCR SCREENING: MRSA by PCR: NEGATIVE

## 2010-07-08 LAB — CARDIAC PANEL(CRET KIN+CKTOT+MB+TROPI)
CK, MB: 5.6 ng/mL — ABNORMAL HIGH (ref 0.3–4.0)
Relative Index: 4.1 — ABNORMAL HIGH (ref 0.0–2.5)
Troponin I: 0.02 ng/mL (ref 0.00–0.06)

## 2010-07-08 LAB — PROTIME-INR
INR: 2.25 — ABNORMAL HIGH (ref 0.00–1.49)
Prothrombin Time: 25.4 seconds — ABNORMAL HIGH (ref 11.6–15.2)
Prothrombin Time: 26.5 seconds — ABNORMAL HIGH (ref 11.6–15.2)

## 2010-07-09 LAB — BASIC METABOLIC PANEL
Calcium: 8.9 mg/dL (ref 8.4–10.5)
GFR calc Af Amer: >60 mL/min (ref >60)
GFR calc non Af Amer: 54 mL/min — ABNORMAL LOW (ref >60)
Glucose, Bld: 129 mg/dL — ABNORMAL HIGH (ref 70–99)
Potassium: 3.9 mEq/L (ref 3.5–5.1)
Sodium: 145 mEq/L (ref 135–145)

## 2010-07-09 LAB — DIFFERENTIAL
Eosinophils Relative: 1 % (ref 0–5)
Lymphocytes Relative: 8 % — ABNORMAL LOW (ref 12–46)
Lymphs Abs: 1 10*3/uL (ref 0.7–4.0)
Monocytes Absolute: 1.4 10*3/uL — ABNORMAL HIGH (ref 0.1–1.0)
Monocytes Relative: 12 % (ref 3–12)
Neutro Abs: 9.4 10*3/uL — ABNORMAL HIGH (ref 1.7–7.7)

## 2010-07-09 LAB — POCT CARDIAC MARKERS
CKMB, poc: 3.8 ng/mL (ref 1.0–8.0)
Myoglobin, poc: 319 ng/mL (ref 12–200)
Troponin i, poc: 0.08 ng/mL (ref 0.00–0.09)

## 2010-07-09 LAB — CBC
HCT: 42.6 % (ref 39.0–52.0)
Hemoglobin: 14.3 g/dL (ref 13.0–17.0)
RBC: 4.41 MIL/uL (ref 4.22–5.81)
RDW: 13.2 % (ref 11.5–15.5)

## 2010-07-09 LAB — POCT B-TYPE NATRIURETIC PEPTIDE (BNP): B Natriuretic Peptide, POC: 160 pg/mL — ABNORMAL HIGH (ref 0–100)

## 2010-07-11 ENCOUNTER — Ambulatory Visit (INDEPENDENT_AMBULATORY_CARE_PROVIDER_SITE_OTHER): Payer: Medicare PPO | Admitting: *Deleted

## 2010-07-11 DIAGNOSIS — Z7901 Long term (current) use of anticoagulants: Secondary | ICD-10-CM

## 2010-07-11 DIAGNOSIS — I4891 Unspecified atrial fibrillation: Secondary | ICD-10-CM

## 2010-07-11 LAB — POCT INR: INR: 2.6

## 2010-07-11 NOTE — Patient Instructions (Addendum)
INR 2.6  Coumadin 5mg  tabs Take 1 tab on WED and SUN Take 1/2 tab on all other days

## 2010-07-30 ENCOUNTER — Other Ambulatory Visit: Payer: Self-pay | Admitting: Internal Medicine

## 2010-07-30 ENCOUNTER — Ambulatory Visit (INDEPENDENT_AMBULATORY_CARE_PROVIDER_SITE_OTHER)
Admission: RE | Admit: 2010-07-30 | Discharge: 2010-07-30 | Disposition: A | Discharge status: Home / Self Care | Payer: Medicare PPO | Source: Ambulatory Visit | Attending: Internal Medicine | Admitting: Internal Medicine

## 2010-07-30 ENCOUNTER — Other Ambulatory Visit: Payer: Self-pay | Admitting: Cardiology

## 2010-07-30 ENCOUNTER — Encounter (INDEPENDENT_AMBULATORY_CARE_PROVIDER_SITE_OTHER): Payer: Medicare PPO | Admitting: Cardiology

## 2010-07-30 ENCOUNTER — Ambulatory Visit (INDEPENDENT_AMBULATORY_CARE_PROVIDER_SITE_OTHER): Payer: Medicare PPO | Admitting: *Deleted

## 2010-07-30 DIAGNOSIS — I4891 Unspecified atrial fibrillation: Secondary | ICD-10-CM

## 2010-07-30 DIAGNOSIS — I6529 Occlusion and stenosis of unspecified carotid artery: Secondary | ICD-10-CM

## 2010-07-30 DIAGNOSIS — I82890 Acute embolism and thrombosis of other specified veins: Secondary | ICD-10-CM

## 2010-07-30 DIAGNOSIS — Z950 Presence of cardiac pacemaker: Secondary | ICD-10-CM

## 2010-07-30 DIAGNOSIS — Z7901 Long term (current) use of anticoagulants: Secondary | ICD-10-CM

## 2010-07-30 NOTE — Progress Notes (Signed)
Pacer check in clinic  

## 2010-08-02 ENCOUNTER — Encounter: Payer: Self-pay | Admitting: Internal Medicine

## 2010-08-03 ENCOUNTER — Telehealth: Payer: Self-pay | Admitting: *Deleted

## 2010-08-03 NOTE — Telephone Encounter (Signed)
Tereso Newcomer received a phone call from [facility] stating right IJ looked normal. The radiologist could not look at left carotid because the CT was ordered without contrast (Dr Margo Aye [phone]).  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  A CT scan had been ordered without contrast due to pts increased renal function.  Pt is on coumadin and ASA however per Missy needs a contrasted study.  Will forward to Dr Gala Romney for review and further orders

## 2010-08-08 NOTE — Telephone Encounter (Signed)
Unable to get contrast study due to renal function. No further w/u needed at this time.

## 2010-08-10 NOTE — Telephone Encounter (Signed)
Pt is aware.  

## 2010-08-28 ENCOUNTER — Encounter: Payer: Self-pay | Admitting: Internal Medicine

## 2010-08-28 ENCOUNTER — Ambulatory Visit (INDEPENDENT_AMBULATORY_CARE_PROVIDER_SITE_OTHER): Payer: Medicare PPO | Admitting: *Deleted

## 2010-08-28 ENCOUNTER — Ambulatory Visit (INDEPENDENT_AMBULATORY_CARE_PROVIDER_SITE_OTHER): Payer: Medicare PPO | Admitting: Internal Medicine

## 2010-08-28 DIAGNOSIS — Z0000 Encounter for general adult medical examination without abnormal findings: Secondary | ICD-10-CM

## 2010-08-28 DIAGNOSIS — N400 Enlarged prostate without lower urinary tract symptoms: Secondary | ICD-10-CM

## 2010-08-28 DIAGNOSIS — E785 Hyperlipidemia, unspecified: Secondary | ICD-10-CM

## 2010-08-28 DIAGNOSIS — Z7901 Long term (current) use of anticoagulants: Secondary | ICD-10-CM

## 2010-08-28 DIAGNOSIS — I5032 Chronic diastolic (congestive) heart failure: Secondary | ICD-10-CM

## 2010-08-28 DIAGNOSIS — Z950 Presence of cardiac pacemaker: Secondary | ICD-10-CM

## 2010-08-28 DIAGNOSIS — M79609 Pain in unspecified limb: Secondary | ICD-10-CM

## 2010-08-28 DIAGNOSIS — I4891 Unspecified atrial fibrillation: Secondary | ICD-10-CM

## 2010-08-28 DIAGNOSIS — I509 Heart failure, unspecified: Secondary | ICD-10-CM

## 2010-08-28 DIAGNOSIS — I10 Essential (primary) hypertension: Secondary | ICD-10-CM

## 2010-08-28 DIAGNOSIS — I2510 Atherosclerotic heart disease of native coronary artery without angina pectoris: Secondary | ICD-10-CM

## 2010-08-28 MED ORDER — AMLODIPINE BESYLATE 10 MG PO TABS: 10.0000 mg | ORAL_TABLET | Freq: Every day | ORAL | Status: DC

## 2010-08-28 MED ORDER — OMEPRAZOLE 20 MG PO CPDR: 20.0000 mg | DELAYED_RELEASE_CAPSULE | Freq: Every day | ORAL | Status: DC

## 2010-08-28 MED ORDER — SIMVASTATIN 20 MG PO TABS: 20.0000 mg | ORAL_TABLET | Freq: Every day | ORAL | Status: DC

## 2010-08-28 MED ORDER — WARFARIN SODIUM 5 MG PO TABS: 5.0000 mg | ORAL_TABLET | Freq: Every day | ORAL | Status: DC

## 2010-08-28 MED ORDER — FUROSEMIDE 20 MG PO TABS: 20.0000 mg | ORAL_TABLET | Freq: Every day | ORAL | Status: DC

## 2010-08-28 MED ORDER — NITROGLYCERIN 0.4 MG SL SUBL: 0.4000 mg | SUBLINGUAL_TABLET | SUBLINGUAL | Status: DC | PRN

## 2010-08-28 MED ORDER — NITROGLYCERIN 0.4 MG SL SUBL: 0.4000 mg | SUBLINGUAL_TABLET | SUBLINGUAL | Status: AC | PRN

## 2010-08-28 MED ORDER — TERAZOSIN HCL 10 MG PO CAPS: 10.0000 mg | ORAL_CAPSULE | Freq: Every day | ORAL | Status: DC

## 2010-08-28 NOTE — Assessment & Plan Note (Signed)
[facility]                         ELECTROPHYSIOLOGY OFFICE NOTE   Francisco Merritt, Francisco Merritt                       MRN:          [id number]  DATE:02/25/2007                            DOB:          1929-07-09    DATE OF SERVICE:  February 25, 2007.   Francisco Merritt was seen today in the Device Clinic for followup of his  newly implanted pacemaker.  On February 12, 2007, Dr. Ladona Ridgel implanted a  Guidant Insignia 1297 device for tachybrady syndrome.  Interrogation of  his pacemaker today demonstrates P-waves of 1.4 mV with an atrial  impedance of 490 ohms and a threshold of 0.4 V at 0.4 msec.  His R-waves  measured 11.9 mV with an RV impedance of 600 ohms and a threshold of 0.8  V at 0.4 msec.  He was in sinus bradycardia today and had had 387 mode  switch episodes, which represents 22% of his total time.  He is  currently on Coumadin therapy.  He is programmed DDD-R with a lower area  of 60 and upper area of 120 with a dynamic AV delay of 260 to 300 msec.  He is currently A-pacing 17% of the time and V-pacing 11% of the time.  His primary cardiologist is Dr. Gala Romney.  An appointment was made  today to return to see Dr. Ladona Ridgel in February of 2009, for a three-month  visit at which point he can be followed in the Device Clinic along with  Dr. Gala Romney.  His Steri-Strips were removed today, and his wound was  without redness or swelling.      Gypsy Balsam, RN,BSN  Electronically Signed      Doylene Canning. Ladona Ridgel, MD  Electronically Signed   AS/MedQ  DD: 02/25/2007  DT: 02/25/2007  Job #: [id number]

## 2010-08-28 NOTE — Op Note (Signed)
Francisco Merritt, Francisco Merritt NO.:  [id number]   MEDICAL RECORD NO.:  [id number]          PATIENT TYPE:  INP   LOCATION:  1334                         FACILITY:  [facility]   PHYSICIAN:  Adolph Pollack, M.D.DATE OF BIRTH:  1929-09-01   DATE OF PROCEDURE:  11/29/2006  DATE OF DISCHARGE:                               OPERATIVE REPORT   PREOPERATIVE DIAGNOSIS:  Choledocholithiasis with cholelithiasis.   POSTOPERATIVE DIAGNOSIS:  Choledocholithiasis with cholelithiasis and  chronic cholecystitis.   PROCEDURE:  Laparoscopic cholecystectomy with intraoperative  cholangiogram.   SURGEON:  Adolph Pollack, M.D.   ASSISTANT:  Angelia Mould. Derrell Lolling, M.D.   ANESTHESIA:  General.   INDICATIONS:  This 77 year old male has been feeling poorly and had  weight loss and was found to have findings consistent with common duct  stones in that he had elevated liver function tests and CT scan  demonstrated dilated ducts and probable stones in the bile duct.  He was  admitted and underwent ERCP yesterday which he tolerated well and free  stones were evacuated.  He had tolerated the ERCP well and is now  brought to the operating room for cholecystectomy.  We discussed the  procedure and risks preoperatively.   TECHNIQUE:  He was brought to the operating room, placed supine on the  operating table and had a general anesthetic administered.  His  abdominal wall hair was clipped and the area sterilely prepped and  draped.  Dilute Marcaine solution was infiltrated in the subumbilical  region and a small subumbilical incision was made through skin and  subcutaneous tissue. The midline fascia was identified and small  incision made in the midline fascia and peritoneum, entered the  peritoneal cavity under direct vision.  The pursestring suture of 0  Vicryl  was placed around the fascial edges.  A Hassan trocar was  introduced to the peritoneal cavity and pneumoperitoneum was created by  insufflation of CO2 gas.   Following this, a laparoscope was introduced.  She was then placed in  the reverse Trendelenburg position, right side tilted slightly up.  An  11 mm trocar was placed in the epigastric incision and two 5 mm trocars  were placed in the right upper quadrant.  The gallbladder was very firm  and thickened, consistent with chronic inflammatory changes.  I tried to  decompress this with a needle with did not get much out.  However, after  this we were able to grasp the gallbladder a little bit better.  Subsequently the fundus was retracted toward the right shoulder  infundibulum was grasped using very careful blunt dissection.  I was  able to immobilize the infundibulum.  I identified what appeared to be  the cystic duct/gallbladder junction, create a window around this, and  also noted a cystic duct lymph node anterior to the cystic duct artery.  I create a window around the cystic duct artery.  This was clipped and  divided allowing for a larger window around what appeared to be a  dilated-type cystic duct.  I placed a clip at the cystic  duct/gallbladder junction, made a small  incision and milked some sludge  and debris over the cystic duct.  A cholangiocath was then passed  through the anterior abdominal wall to the cystic duct and cholangiogram  was performed.   Under real time fluoroscopy, dilute contrast was injected to the cystic  duct which was of moderate length and relatively small caliber with  respect to the lumen.  Hepatic and common bile ducts all filled.  Appeared to be debris in the common bile duct.  Contrast drained  promptly into the duodenum without hesitation.  Final reports pending  radiologist interpretation.   The cholangiocath was removed.  I then clipped the cystic duct and  divided and then placed two Endoloops on the cystic duct as well.  The  gallbladder was then dissected free from the liver using electrocautery  and part of it was  intrahepatic, exposing a fair amount of the liver  around the fundus.  I placed the gallbladder in the Endopouch bag and  controlled bleeding with electrocautery.  I then placed Surgicel on the  gallbladder fossa, copiously irrigated out of the area prior to Surgicel  placement.  There was good hemostasis and no evidence of bile leak.   A 19 Blake drain was placed into the right upper quadrant, exited out  the lateral 5 mm trocar site and anchored to the skin.  A drain was  placed in the gallbladder fossa.  I then evacuated as much irrigation  fluid as possible and removed the gallbladder and the Endopouch bag  through the subumbilical port.  The subumbilical fascial defect was  closed under laparoscopic vision by tightening up and tying down the  pursestring suture.  The remaining trocars were removed and the skin  incisions closed with four Monocryl subcuticular stitches.  Sterile  dressings were applied.   He tolerated the procedure well without any apparent complications and  was taken recovery in satisfactory condition.      Adolph Pollack, M.D.  Electronically Signed     TJR/MEDQ  D:  11/29/2006  T:  11/30/2006  Job:  [id number]   cc:   Gordy Savers, MD  [address] [place]  [place] [address]   Wilhemina Bonito. Marina Goodell, MD  [address]  [place]  [place] [address]

## 2010-08-28 NOTE — Assessment & Plan Note (Signed)
[facility]                         ELECTROPHYSIOLOGY OFFICE NOTE   KARELL, TUKES                       MRN:          [id number]  DATE:06/09/2007                            DOB:          February 22, 1930    Mr. Pollitt returns today for followup.  He is a very pleasant elderly  male with history of symptomatic tachy-brady syndrome and paroxysmal  atrial fibrillation with documented pauses and rapid rates who is status  post pacemaker insertion from back in October.  He returns today for  followup.  He has had no chest pain or shortness of breath.  He denies  syncope.  He is back doing his usual activities including chopping wood.   MEDICATIONS:  1. Hytrin 10 mg a day.  2. Aspirin 81 a day.  3. Warfarin as directed.  4. Simvastatin 20 a day.  5. Metoprolol 50 a day.   PHYSICAL EXAMINATION:  GENERAL:  He is a pleasant, well-appearing,  elderly man in no distress.  VITAL SIGNS:  Blood pressure 140/86, the pulse 75 and regular,  respirations 18.  Weight was 219 pounds.  NECK:  Revealed no jugular distention.  LUNGS:  Clear bilaterally to auscultation.  No wheezes, rales or rhonchi  are present.  CARDIOVASCULAR:  Exam revealed a regular rate and rhythm.  Normal S1-S2.  EXTREMITIES:  Demonstrate no edema.  SKIN:  His pacemaker site was healed nicely.   Interrogation of his pacemaker demonstrates a [employer].  The P  and R waves were 1.5 and 12. The impedance was 550 in the A and 600 in  the V.  Threshold was 0.8 at 0.4 in the A and 0.7 at 0.4 in the V.  He  was 20% atrial fibrillation, 80% sinus rhythm, and 21% A paced, 9% V  paced. Today we turned his outputs down in the atrium and ventricle at 2  and 2.5, respectively, to maximize battery longevity.  I have asked that  he continue to be active and watch his blood pressure.   We will see him back in the office in October.     Doylene Canning. Ladona Ridgel, MD  Electronically Signed    GWT/MedQ   DD: 06/09/2007  DT: 06/09/2007  Job #: [id number]   cc:   Gordy Savers, MD

## 2010-08-28 NOTE — Assessment & Plan Note (Signed)
[facility]                            CARDIOLOGY OFFICE NOTE   Francisco Merritt, Francisco Merritt                       MRN:          [id number]  DATE:03/17/2007                            DOB:          04/07/1930    PRIMARY CARE PHYSICIAN:  Dr. Eleonore Chiquito.   INTERVAL HISTORY:  Francisco Merritt is a delightful 77 year old male with a  history of 1 vessel coronary artery disease with a totally occluded  diagonal branch by recent catheterization.  He also has atrial  fibrillation complicated by tachy-brady syndrome and is status post  recent pacemaker.  Other medical history is notable for hypertension and  cholelithiasis status post recent cholecystectomy.   Francisco Merritt returns today for return followup.  He says he continues  to feel much better.  His energy is increasing slowly, and he is able to  do most of his activities without any limitations.  He does get short of  breath if he exerts himself more strenuously.  He occasionally gets a  very brief chest pain, which can last just seconds, usually when he is  watching TV about 1 time per week.  He has never had a prolonged  episode.  Denies any palpitations and no significant edema, orthopnea,  or PND.   CURRENT MEDICATIONS:  1. Hytrin 10 mg a day.  2. Tylenol Arthritis.  3. Aspirin 81.  4. Multivitamin.  5. Warfarin.  6. Simvastatin 20.  7. Metoprolol 50.   PHYSICAL EXAM:  He is an elderly male in no acute distress.  Ambulates  around the clinic slowly without any respiratory difficulty.  Blood pressure is 124/79, heart rate is 84, weight is 211.  HEENT:  Normal.  NECK:  Supple.  There is no JVD.  Carotids are 2+ bilaterally without  any bruits.  There is no lymphadenopathy or thyromegaly.  CARDIAC:  PMI is nondisplaced.  He had a regular rate and rhythm.  No  murmurs, rubs, or gallops.  LUNGS:  Clear.  Pacemaker site looks good.  ABDOMEN:  Soft, nontender, nondistended.  No hepatosplenomegaly.   No  bruits.  No masses.  Good bowel sounds.  EXTREMITIES:  Warm with no cyanosis, clubbing, or edema.  No rash.  NEURO:  Alert and oriented x3.  Cranial nerves 2-12 are intact.  Moves  all 4 extremities without difficulty.  Affect is very pleasant.   Review of ancillary data shows on echocardiogram, EF is well preserved.  Ejection fraction of 55%.  There was moderate TR and very small  posterior pericardial effusion.  Interrogating his pacemaker today shows  that he is in atrial fibrillation approximately 23% of the time, but his  ventricular response is well controlled during these periods.   ASSESSMENT:  1. Coronary artery disease.  This is stable.  Will continue medical      therapy.  No evidence of ischemia.  2. Hypertension, well controlled.  He is on a fairly large dose of      Hytrin.  I suspect this was used due to his previous bradycardia.      We could consider switching  this over to a more favorable agent in      the future.  However, he may need it for his prostate.  3. Atrial fibrillation with tachy-brady syndrome.  He is doing well on      current therapy.  Will continue Coumadin.  4. Hyperlipidemia.  This is followed by Dr. Amador Cunas.  Given his      coronary artery disease, I would adjust his Statin to keep his LDL      below 70.  5. Erectile dysfunction.  He is asking about the safety of using      medications like Levitra.  I told him this would be fine.  I made      it clear to him that he is not to use any nitroglycerin should he      develop chest pain while having Levitra on board.  He expressed      understanding of this.   DISPOSITION:  I will see him back in clinic in 6 months for routine  followup.     Bevelyn Buckles. Bensimhon, MD  Electronically Signed    DRB/MedQ  DD: 03/17/2007  DT: 03/17/2007  Job #: [id number]   cc:   Gordy Savers, MD

## 2010-08-28 NOTE — Discharge Summary (Signed)
Francisco Merritt, Francisco NO.:  [id number]   MEDICAL RECORD NO.:  [id number]          PATIENT TYPE:  INP   LOCATION:  2899                         FACILITY:  [facility]   PHYSICIAN:  Doylene Canning. Ladona Ridgel, MD    DATE OF BIRTH:  June 16, 1929   DATE OF ADMISSION:  02/10/2007  DATE OF DISCHARGE:  02/10/2007                               DISCHARGE SUMMARY   FINAL DIAGNOSES:  1. Tachybrady syndrome.  2. Atrial fibrillation rapid ventricular rate (paroxysmal)  3. Bradycardia on beta blocker therapy  4. INR of 3.2 on admission for pacemaker implantation February 10, 2007, procedure aborted.   SECONDARY DIAGNOSES:  1. Atrial fibrillation subsequent to cholecystectomy earlier this year  2. Hypertension.  3. Left heart catheterization December 25, 2006, diastolic      dysfunction.  Second diagonal is 100% occluded. It is small,      medical therapy pursued.  Ejection fraction 60% at [facility] this      year.   PATIENT DISCHARGING ON HIS MEDICATIONS:  1. Simvastatin 20 mg at bedtime  2. Enteric-coated aspirin 81 mg daily  3. Hytrin 10 mg daily  4. Vitamin C 500 mg daily  5. Coumadin 5 mg daily except 2.5 mg on Wednesday.  This will be on      hold until he comes back on October 30 for pacemaker.  6. Multivitamin daily   BRIEF HISTORY:  [name] is a 77 year old male.  He had atrial  fibrillation discovered after a surgery for acute on chronic  cholecystitis earlier this year.  He had chest pain with atrial  fibrillation and rapid ventricular response.  He ruled out for  myocardial infarction.  He became severely bradycardic on conversion  from atrial fibrillation.  He cannot tolerate low doses of beta blockade  secondary to his bradycardia.   The patient had an office visit on October 23.  He said he is feeling  fairly well with occasional fluttering.  Electrocardiogram showed atrial  fibrillation with a rate of 153 beats per minute.  He was transferred  directly to  [facility] for rate control.  He was scheduled for  pacemaker as well but this could not be done secondary  to schedule constraints.  He was continued on his Coumadin and was to  reappear at [facility] for pacemaker with Dr. Ladona Ridgel October  28th.  Unfortunately his INR was 3.2 and the procedure had to be  aborted.  The patient will return October 30 at 9:30.  He will hold  Coumadin Tuesday October 28 and Wednesday October 29      Maple Mirza, [place]      Doylene Canning. Ladona Ridgel, MD  Electronically Signed    GM/MEDQ  D:  02/10/2007  T:  02/10/2007  Job:  [id number]   cc:   Doylene Canning. Ladona Ridgel, MD  Bevelyn Buckles. Bensimhon, MD

## 2010-08-28 NOTE — H&P (Signed)
Francisco Merritt, Francisco Merritt NO.:  [id number]   MEDICAL RECORD NO.:  [id number]          PATIENT TYPE:  INP   LOCATION:  1334                         FACILITY:  [facility]   PHYSICIAN:  Hedwig Morton. Juanda Chance, MD     DATE OF BIRTH:  04-30-1929   DATE OF ADMISSION:  11/28/2006  DATE OF DISCHARGE:                              HISTORY & PHYSICAL   CHIEF COMPLAINT:  Fatigue and weight loss.   HISTORY:  Francisco Merritt is a 77 year old male patient of Dr.  Vernon Prey, new to GI, who was an acute work-in earlier today per Dr.  Lina Sar for evaluation of an abnormal CT scan of the abdomen and  pelvis which was done on November 27, 2006, showing dilated intrahepatic  ducts as well as pancreatic duct with question of stones in the distal  common bile duct.  The patient, interestingly, had not complained of any  abdominal pain, has had no jaundice or diaphoresis, his bowel habits  have been normal.  He has complained of fatigue and decrease in his  appetite over the past several months with subsequent 18-pound weight  loss.  The patient has had some episodes of vomiting intermittently  recently.  He was seen in the office, evaluated, and was felt that ERCP  was indicated.  He is at this time admitted to the hospital to undergo  ERCP for diagnostic and possibly therapeutic purposes, and then further  workup as indicated.   LABORATORY STUDIES:  Total bilirubin 0.9, AST of 92, ALT of 115, alk  phos of 329.   CURRENT MEDICATIONS:  1. Hytrin 10 mg p.o. daily.  2. Tylenol Arthritis p.r.n.  3. Vitamin E, C and a multivitamin daily.  4. Glucosamine supplement.   ALLERGIES:  SULFA.   PAST MEDICAL HISTORY:  Hypertension and osteoarthritis.  He has had no  prior surgeries.   FAMILY HISTORY:  Negative for colon cancer or other GI problems.   SOCIAL HISTORY:  The patient is married.  He has five grown children.  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  He is a nonsmoker, nondrinker.   REVIEW OF  SYSTEMS:  The patient does have history of nocturia, had been  evaluated last year and placed on Proscar but has not been requiring it  recently, has had no current symptoms.  GI:  The patient had had EGD and  colonoscopy remotely in our office, possibly 10+ years ago.  CARDIOVASCULAR:  Negative for chest pain or anginal symptoms.  PULMONARY:  Negative for cough, shortness of breath or sputum  production.  MUSCULOSKELETAL:  Positive for arthritic symptoms.  All  other review of systems negative.   PHYSICAL EXAMINATION:  Well-developed male in no acute distress, mildly  overweight, alert and oriented x3.  He is anicteric.  Blood pressure  126/64 in the office, pulse 84, weight 210.  HEENT:  Nontraumatic, normocephalic.  EOMI, PERRLA.  Sclerae anicteric.  NECK:  Supple without nodes.  CARDIOVASCULAR:  Regular rate and rhythm with S1 and S2.  PULMONARY:  Clear to A and P.  ABDOMEN:  Soft.  He has minimal  tenderness in the epigastrium and to the  left of the epigastrium.  No palpable mass or hepatosplenomegaly.  No  CVA tenderness.  Bowel sounds are present.  RECTAL:  Hemoccult negative.  EXTREMITIES:  No clubbing, cyanosis or edema.  He does have some  Dupuytren's contractures in the left arm.  NEUROLOGIC:  The patient is alert and oriented x3 and grossly nonfocal.   Review of the CT scan:  Essentially normal liver, mildly dilated common  bile duct as well as intrahepatic radicals.  There was no definite liver  or common bile duct lesion.  Question of stones in the distal common  bile duct.  The gallbladder appeared contracted.  Pancreatic duct mildly  dilated.   IMPRESSION:  64. A 77 year old male with weight loss, intermittent vomiting,      elevated liver function tests, and dilated intrahepatic radicals as      well as pancreatic duct on CT scan.  Rule out biliary obstruction,      malignant - i.e. ampullary malignancy or pancreatic malignancy,      versus common bile duct stones   2. Hypertension.  3. Osteoarthritis.   PLAN:  The patient is admitted to the hospital to undergo ERCP today per  Dr. Marina Goodell and then further workup pending findings of ERCP.      [name] [place], PA-C      Dora M. Juanda Chance, MD  Electronically Signed    AE/MEDQ  D:  11/28/2006  T:  11/29/2006  Job:  [id number]   cc:   Gordy Savers, MD  [address] [place]  [place] [address]

## 2010-08-28 NOTE — Consult Note (Signed)
NAMEHERSHELL, BRANDL NO.:  [id number]   MEDICAL RECORD NO.:  [id number]          PATIENT TYPE:  INP   LOCATION:  1334                         FACILITY:  [facility]   PHYSICIAN:  Bevelyn Buckles. Bensimhon, MDDATE OF BIRTH:  1929/10/06   DATE OF CONSULTATION:  11/29/2006  DATE OF DISCHARGE:                                 CONSULTATION   [facility]   PRIMARY CARE PHYSICIAN:  Gordy Savers, MD.   CONSULTING PHYSICIAN:  Adolph Pollack, MD.   REASON FOR CONSULTATION:  Postoperative chest pain and atrial  fibrillation.   HISTORY OF PRESENT ILLNESS:  Francisco Merritt is a very pleasant, 76-year-  old male with a history of hypertension and BPH.  He has no known  history coronary artery disease, he has never had a stress test or  cardiac catheterization.   He was admitted with acute on chronic cholecystitis.  He was found to  have obstructive common bile duct stones and underwent ERCP.  Today, he  underwent a laparoscopic cholecystectomy with Dr. Abbey Chatters.  The  procedure apparently uncomplicated.  Postoperatively, Francisco Merritt  developed some chest pain, which was relieved with morphine and one  sublingual nitroglycerin.  He also developed intermittent runs of atrial  fibrillation with a rapid ventricular response going back and forth with  a sinus bradycardia in the 40 to 60 range.  This is asymptomatic.  His  chest pain is now relieved, and he continues with occasional, brief,  atrial fibrillation, which lasts just a few seconds and then resolves.   EKG was obtained that showed no significant ST T-wave changes.  First  set of cardiac markers were negative with a troponin of 0.03.   REVIEW OF SYSTEMS:  At baseline, he is relatively active.  He keeps a  large garden and is able to work for up to half an hour at a time  without any symptoms.  He does stop and take a break when he needs to.  He also recently has had some weight loss.   He denies fevers, chills, he  has not had any bright red blood per rectum or melena.  No focal  neurologic symptoms.  The remainder of the review of systems is negative  except for HPI and Problem List.   PROBLEM LIST:  1. Hypertension.  2. BPH.  3. History of peptic ulcer disease.   CURRENT MEDICATIONS:  Unasyn, IV fluids, Fentanyl.  He takes Hytrin at  home for high blood pressure.   ALLERGIES:  SULFA.   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  He denies any tobacco or alcohol use.   FAMILY HISTORY:  Father died in his 107s from a myocardial infarction,  mother died in her 70s from complications from diabetes, he did have one  brother, who died in his 12s from a stroke, but he also had a history of  myocardial infarction.   PHYSICAL EXAMINATION:  GENERAL:  He is lying in bed in no acute  distress, respirations are unlabored.  He appears fatigued.  VITAL SIGNS:  Blood pressure is 150/67 with a mean of 94, heart rate is  64 currently in sinus.  His saturation is in the high 90s on 2 liters  nasal cannula.  HEENT:  Normal.  NECK:  Supple, no JVD, carotids are 2+ bilaterally without any bruits,  there is no lymphadenopathy or thyromegaly.  CARDIAC:  PMI is nonpalpable, he is irregular with no obvious murmurs,  but he does have an S4.  LUNGS:  Clear with minimal crackles at the bases.  ABDOMEN:  Obese, soft.  He has hypoactive bowel sounds.  He has a drain  in place from his laparoscopic cholecystectomy.  He is significantly  tender in the epigastrium with no rebound or guarding.  EXTREMITIES:  Warm with no cyanosis, clubbing, or edema, distal pulses  are 2+ bilaterally, there is no rash.  NEURO:  He is alert and oriented x3, cranial nerves II-XII are intact,  moves all four extremities without difficulty, affect is very pleasant.   EKG shows atrial fibrillation at a rate of 124.  There are no  significant ST T-wave abnormalities, he is  now in sinus rhythm, troponin  was 0.03, CK was 53, MB was 1.9.  Sodium is 137, potassium 3.8,  creatinine is 1.26, BUN is 12.   ASSESSMENT:  1. Postoperative chest pain.  2. Sick sinus syndrome with postoperative intermittent atrial      fibrillation.  3. History of hypertension.   PLAN/DISCUSSION:  We will observe him overnight in stepdown.  Unfortunately, we will be unable to use a beta blocker secondary to his  bradycardia, one could consider Acebutolol, but we will hold off for  now.  We will rule him out for a myocardial infarction, check a TSH and  echocardiogram.  If his atrial fibrillation persists, we will need to  consider a possible antiarrhythmic and Coumadin.  He will likely need an  Adenosine-Myoview prior to discharge.  I will continue to follow with  you, we appreciate the consult.      Bevelyn Buckles. Bensimhon, MD  Electronically Signed     DRB/MEDQ  D:  11/29/2006  T:  11/29/2006  Job:  [id number]

## 2010-08-28 NOTE — Assessment & Plan Note (Signed)
[facility]                         GASTROENTEROLOGY OFFICE NOTE   Francisco Merritt, Francisco Merritt                       MRN:          [id number]  DATE:11/28/2006                            DOB:          1929-08-19    Francisco Merritt is a 77 year old gentleman who is an acute work-in for  evaluation of abnormal CT scan of the abdomen which was done yesterday,  showing dilated intrahepatic duct and as well as pancreatic duct with  question of stones in the distal common bile duct.  The patient has been  complaining of decreased level of energy, decreased appetite, and 18-  pound weight loss from usually 226 pounds to about 208 pounds over a  period of several months.  He had several episodes of vomiting recently.  There has been no jaundice, night sweats, or abdominal pain.  His bowel  habits have been regular.  He denies taking NSAIDs.   MEDICATIONS:  1. Hytrin 10 mg p.o. daily.  2. Tylenol Arthritis.  3. Vitamin E, C, and multivitamin.  4. Glucosamine.   PAST HISTORY:  Significant for high blood pressure.  No operations.   FAMILY HISTORY:  Negative for colon cancer.   SOCIAL HISTORY:  Married with five children.  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  He  does not smoke and does not drink alcohol.   REVIEW OF SYSTEMS:  Essentially negative, particularly for nocturia,  which was evaluated last year with the patient put on Proscar but no  longer needs it.  He also had an upper endoscopy and colonoscopy about  10 years ago in our office.  We do not have the records.  He was thought  to have an ulcer.   PHYSICAL EXAM:  Blood pressure 126/64, pulse 84, and weight is 210  pounds.  He was mildly overweight, alert and oriented.  Sclerae nonicteric.  LUNGS:  Inspiratory rales.  Jugular veins not distended.  CARDIAC:  Normal S1, normal S2.  SKIN:  Dupuytren contractures in left arm.  ABDOMEN:  Soft with minimal tenderness in the epigastrium to the left of  the epigastrium with  no palpable mass or pulsations.  Liver edge was at  costal margin.  There was no CVA tenderness.  Low abdomen was normal.  RECTAL:  Hemoccult-negative soft, yellow stool.  EXTREMITIES:  No edema.   CT scan of the abdomen, as stated, has shown essentially normal liver  with mildly dilated common bile duct as well as intrahepatic radicles.  No definite liver or common bile duct lesion.  Question of possible  stones in the distal common bile duct.  Gallbladder appears to be  contracted.  Pancreatic duct is mildly dilated.  The pancreas itself  does not show any discrete mass.   IMPRESSION:  A 77 year old gentleman with weight loss and dilated  intrahepatic radicles as well as pancreatic duct, raising a question of  pancreatic or possibly ampullary malignancy.  His liver function tests  are mildly elevated, showing total bilirubin normal at 0.9 but AST of 92  and ALT of 115 with alkaline phosphatase of 329.  Although this could be  a benign disease, malignancy needs to be considered first.  I will  arrange for him to have an ERCP either today, in next few days, whenever  I can arrange it.  In the meantime we will obtain CA19-9 tumor marker.     Francisco Merritt. Francisco Chance, MD  Electronically Signed    DMB/MedQ  DD: 11/28/2006  DT: 11/28/2006  Job #: [id number]   cc:   Francisco Savers, MD

## 2010-08-28 NOTE — Discharge Summary (Signed)
NAMEMAXSON, ODDO NO.:  [id number]   MEDICAL RECORD NO.:  [id number]          PATIENT TYPE:  INP   LOCATION:  1437                         FACILITY:  [facility]   PHYSICIAN:  Adolph Pollack, M.D.DATE OF BIRTH:  20-Jul-1929   DATE OF ADMISSION:  11/28/2006  DATE OF DISCHARGE:  12/04/2006                               DISCHARGE SUMMARY   FINAL DIAGNOSIS:  Choledocholithiasis.   SECONDARY DIAGNOSES:  1. Chronic cholecystitis, cholelithiasis.  2. Atrial fibrillation with rapid ventricular response.  3. Hypertension.  4. Osteoarthritis.  5. Slight elevation of C 19-9 (46.5).  6. Transient urinary retention.   REASON FOR ADMISSION:  This is a 77 year old male patient of Dr. Eleonore Chiquito who was noted to have some dilated intrapancreatic ducts  with some fatigue, weight loss, and intermittent vomiting.  He underwent  ERCP on November 28, 2006, and three common bile duct stones were  extracted and a cystic duct stone was noted.  He subsequently was  admitted and I was asked to see him for a laparoscopic cholecystectomy.   HOSPITAL COURSE:  He was admitted by the GI service and underwent the  ERCP, with the findings as above.  CA 19-9 was slightly elevated at  46.5.  CEA was normal.  Liver function tests declined.  After ERCP, he  underwent a laparoscopic cholecystectomy which demonstrated  a thickened  gallbladder wall with chronic inflammatory changes.   Postoperatively, he went into atrial fibrillation with rapid ventricular  response, and cardiology was consulted.  He was started on appropriate  medication for this and was able to be transferred from stepdown to the  floor.  Diet was started.  He subsequently was started on  anticoagulation with Coumadin.  He had a brief episode of urinary  retention, but we were able to remove his Foley after restarting his  Hytrin.  His INR started to rise, and it was felt on December 04, 2006,  with his INR going up,  that he would be able to be discharged.   DISPOSITION:  Discharged to home on December 04, 1998 in satisfactory  condition.  He was told to take Tylenol for pain.  He will take Coumadin  as well as Cardizem CD.  He will follow up in the PT Coumadin Clinic on  Monday and follow up with the [facility] in 2-4 weeks.  He will  need to have a repeat CA 19-9 done in the future. He will follow up with  me in about 2 weeks in the office.  He was given discharge instructions.      Adolph Pollack, M.D.  Electronically Signed     TJR/MEDQ  D:  12/30/2006  T:  12/30/2006  Job:  [id number]   cc:   Wilhemina Bonito. Marina Goodell, MD  [address]  [place]  [place] [address]   Duke Salvia, MD, [facility]  [address]  [address]  [place]  [place] [address]   Gordy Savers, MD  [address] [place]  [place] [address]

## 2010-08-28 NOTE — H&P (Signed)
[name], [name] NO.:  [id number]   MEDICAL RECORD NO.:  [id number]           PATIENT TYPE:   LOCATION:                                 FACILITY:   PHYSICIAN:  Francisco Merritt, MDDATE OF BIRTH:  13-Dec-1929   DATE OF ADMISSION:  02/05/2007  DATE OF DISCHARGE:                              HISTORY & PHYSICAL   PRIMARY CARE PHYSICIAN:  Francisco Savers, MD.   REASON FOR ADMISSION:  Atrial fibrillation with rapid ventricular  response in the setting of tachybrady syndrome.   HISTORY OF PRESENT ILLNESS:  Francisco Merritt is a delightful 77 year old  male with a history of one vessel coronary artery disease as well as  paroxysmal atrial fibrillation with tachybrady syndrome.  He has a  normal ejection fraction.   His cardiac history dates back to August when he was admitted for a  cholecystectomy.  Postoperatively, he had mild chest pain.  He ruled out  for myocardial infarction with serial cardiac markers, however, he did  also have atrial fibrillation with rapid ventricular response  alternating with sinus bradycardia in the 40-60 heartbeats per minute  range.  He was discharged with medical therapy.  Subsequently he had a  stress test which showed some anterior wall ischemia.  He eventually  underwent cardiac catheterization which showed one vessel coronary  artery disease with totally occluded second diagonal.  There are 30-40%  lesions elsewhere.   He returned to the clinic today for routine follow-up.  He is doing  fairly well.  He is very active.  He gets just occasional twinges of  chest pain which last just a second or two and resolve.  He has never  had to take a nitroglycerin.  He has occasional chest flutters but no  syncope or presyncope.  On exam, he was noted to be irregular and  tachycardic.  EKG showed atrial fibrillation with a rapid ventricular  response at 153 beats per minute.  He has not had any heart failure  symptoms.  He has been  tolerating his Coumadin well.  He does have easy  bruising but no significant bleeding.   ALLERGIES:  SULFA.   CURRENT MEDICATIONS:  1. Hytrin 10 mg a day.  2. Aspirin 81.  3. Tylenol Arthritis.  4. Multivitamin.  5. Glucosamine/chondroitin.  6. Coumadin.  7. Simvastatin 20 a day.   REVIEW OF SYSTEMS:  Notable for some joint pain but all other systems  are negative except for HPI and problem list.   PROBLEM LIST:  1. Paroxysmal atrial fibrillation with tachybrady syndrome.  2. One vessel coronary artery disease with normal ejection fraction.  3. History of gallstones status post cholecystectomy.  4. Hypertension.  5. Hyperlipidemia.  6. History of peptic ulcer disease.   SOCIAL HISTORY:  He lives with his wife in [place], [place].  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  No tobacco or alcohol use.   FAMILY HISTORY:  Father died in his 73s from myocardial infarction.  Mother died in her 64s from complications from  diabetes.  He did have  one brother who died in his 84s from a stroke but also a history of  coronary artery disease with myocardial infarction.   PHYSICAL EXAMINATION:  VITAL SIGNS:  Blood pressure 122/82, heart rate  150.  GENERAL APPEARANCE:  He is well-appearing, ambulatory in the clinic  without any respiratory difficulty.  HEENT:  Normal.  NECK:  Supple.  No JVD.  Carotids 2+ bilaterally without any bruits.  There is no lymphadenopathy or thyromegaly.  LUNGS:  Clear.  CARDIOVASCULAR:  His PMI is normal.  He is irregular and tachycardic.  No obvious murmurs.  ABDOMEN:  Soft, nontender, nondistended.  No hepatosplenomegaly, no  bruits, no masses, good bowel sounds.  EXTREMITIES:  Warm with no clubbing, cyanosis, or edema.  There is no  rash.  NEUROLOGIC:  He is alert and oriented x3.  Cranial nerves II-XII intact.  Moves all four extremities without difficulty.  Affect is pleasant.   EKG shows atrial  fibrillation with a rapid ventricular response at a  rate of 153.  There is no significant STT wave abnormalities.   ASSESSMENT/PLAN:  1. Atrial fibrillation with rapid ventricular response.  2. Tachybrady syndrome.  3. One vessel coronary artery disease with normal ejection fraction.  4. Hypertension.  5. Hyperlipidemia.   PLAN/DISCUSSION:  Will admit him to the hospital for rate control with  IV diltiazem.  We have to watch closely for bradycardia given his  tachybrady syndrome.  I suspect that he will either need a pacemaker or  antiarrhythmic therapy and we will follow this as needed in the  hospital.      Francisco Merritt. Bensimhon, MD  Electronically Signed     DRB/MEDQ  D:  02/05/2007  T:  02/05/2007  Job:  [id number]

## 2010-08-28 NOTE — Assessment & Plan Note (Signed)
[facility]                            CARDIOLOGY OFFICE NOTE   DYWANE, PERUSKI                       MRN:          [id number]  DATE:09/03/2007                            DOB:          11-02-1929    PRIMARY CARE PHYSICIAN:  Gordy Savers, MD   INTERVAL HISTORY:  Francisco Merritt is a delightful 77 year old male with a history  of one-vessel coronary artery disease with a totally occluded diagonal  branch by catheterization.  Otherwise no significant coronary disease.  He also has atrial fibrillation complicated by tachybrady syndrome and  is status post previous pacemaker implantation.  Other medical history  is notable for hypertension.   Cap returns today for routine follow-up.  He is doing great.  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  He does get occasional minimal twinges of  chest pain, but no true angina.  He also notes he gets tired once in a  while and has to sit down.  He denies any palpitations.  No syncope or  presyncope.  Dr. Amador Cunas recently increased his beta blocker, I  suspect due to his blood pressure.   CURRENT MEDICATIONS:  1. Hytrin 10 mg a day.  2. Aspirin 81.  3. Multivitamin.  4. Warfarin.  5. Simvastatin.  6. Metoprolol 50 b.i.d.   REVIEW OF SYSTEMS:  He has not had any bleeding on the Coumadin.  He  Does have some arthritis and takes Tylenol Arthritis medications.  No  nausea, vomiting, fevers or chills.  No focal neurologic symptoms.  The  remainder of the review of systems is negative except for HPI and  problem list.   PHYSICAL EXAMINATION:  GENERAL:  He is well-appearing, no acute  distress, ambulates around the clinic without any respiratory  difficulty.  VITAL SIGNS:  Blood pressure 104/70, heart rate 94, weight is 219.  HEENT:  Normal.  NECK:  Supple.  No JVD.  Carotid 2+ bilaterally without bruits.  There  is no lymphadenopathy,  thyromegaly.  CARDIAC:  PMI is nondisplaced.  He is regular.  No murmurs, rubs or  gallops.  LUNGS:  Clear.  Pacemaker site looks good.  ABDOMEN:  Soft, nontender, nondistended.  No hepatosplenomegaly, no  bruits, no masses.  Good bowel sounds.  EXTREMITIES:  Warm with no cyanosis, clubbing or edema.  No rash.  NEUROLOGICAL:  Alert and oriented x3.  Cranial nerves II-XII are intact.  Moves all four extremities without difficulty.  Affect is very pleasant.   Interrogating his device shows intermittent atrial fibrillation with  high atrial rates.  However, his ventricular rates have been well-  controlled generally in the 50-80 range.  Very few high ventricular  rates.   STUDIES:  EKG today shows atrial fibrillation at a rate of 94 with  occasional demand paced beats.   ASSESSMENT/PLAN:  1. Atrial fibrillation.  I initially thought we were going to increase      his beta-blocker today given that his rate is a little bit high in      the  office, but overall, according to his device, his rate has been      well-controlled.  We will continue current therapy.  Continue      Coumadin.  2. Coronary artery disease.  This is quite stable.  Would titrate his      statin to keep LDL below 70.  This is done by Dr. Amador Cunas.  3. Hypertension, well-controlled.   DISPOSITION:  Will see him back in 9 months for routine follow-up.     Bevelyn Buckles. Bensimhon, MD  Electronically Signed    DRB/MedQ  DD: 09/03/2007  DT: 09/03/2007  Job #: [id number]

## 2010-08-28 NOTE — Consult Note (Signed)
Francisco Merritt, Francisco Merritt NO.:  [id number]   MEDICAL RECORD NO.:  [id number]          PATIENT TYPE:  INP   LOCATION:  1334                         FACILITY:  [facility]   PHYSICIAN:  Adolph Pollack, M.D.DATE OF BIRTH:  Oct 21, 1929   DATE OF CONSULTATION:  11/28/2006  DATE OF DISCHARGE:                                 CONSULTATION   REASON FOR CONSULTATION:  Choledocholithiasis, cholelithiasis.   HISTORY OF PRESENT ILLNESS:  This is a 77 year old male with a 56-month  history of epigastric postprandial pain.  He gets discomfort soon after  eating and thus has loss 20-pounds because of this discomfort after  eating, he states.  He subsequently was trying to increase the amount of  insurance he had, and apparently was told he failed because of abnormal  blood tests.  He subsequently informed Dr. Amador Cunas of this, and was  noted to have some elevation of liver functions and sent for a CT scan  on August 14 which demonstrated a dilated intrahepatic ducts and common  bile duct and also calculi and partial obstruction of distal common bile  duct.  There is a contracted gallbladder, but no definite known  gallstones.  He subsequently underwent an ERCP today with a  sphincterotomy and stone extraction and was then admitted just for  overnight observation.   X-RAYS:  There were no complications from the ERCP here.  While he was  here, we were asked to see him to discuss a cholecystectomy in the  future.  He said at home he had intermittent fevers and chills as well.   PAST MEDICAL HISTORY:  1. Hypertension  2. Degenerative joint disease.  3. Low back pain.   PAST SURGICAL HISTORY:  None.   ALLERGIES:  SULFA DRUGS.   CURRENT MEDICATIONS:  IV Unasyn, Zofran, morphine p.r.n. (he also takes  Hytrin at home for as hypertension).   SOCIAL HISTORY:  No alcohol use.  He is married and lives at home with  three children who are adults and somewhat handicapped.  He does not  smoke cigarettes.   REVIEW OF SYSTEMS:  CARDIOVASCULAR:  No heart disease.  PULMONARY:  Denies any emphysema, pneumonia or asthma.  GI:  He states he had an  ulcer in the past.  No hepatitis, diverticulitis.  GU:  No kidney  stones.  ENDOCRINE:  No diabetes or hypercholesterolemia.  NEUROLOGIC:  No strokes or seizures.  HEMATOLOGIC:  No bleeding disorders, blood  clots, transfusions.   PHYSICAL EXAMINATION:  GENERAL:  A flushed appearing male, pleasant and  cooperative.  Temperature of my exam was 99 degrees.  His blood pressure  is 139/72, pulse 95.  HEENT:  EYES:  Extraocular motions intact.  No icterus.  NECK:  Supple without obvious masses.  RESPIRATORY:  Breath sounds equal and clear.  Respirations unlabored.  CARDIOVASCULAR:  Demonstrates a regular rate, regular rhythm.  No murmur  heard.  ABDOMEN:  S, nontender, nondistended.  No masses, organomegaly, hernias  and has a few bowel sounds present.  MUSCULOSKELETAL:  Good range of motion.  Good muscle tone.  No edema.  STUDIES:  CT scan reviewed.   IMPRESSION:  Postprandial epigastric pain and found to have  choledocholithiasis very likely secondary to cholelithiasis.  He has  undergone ERCP and sphincterotomy successfully and has been admitted for  overnight observation.   PLAN:  I talked with him about scheduling an elective laparoscopic  cholecystectomy in the future.  I discussed with him the procedure and  risks in detail.  Risks include but are not limited to bleeding,  infection, wound healing problems, anesthesia, external injury to the  common bile ducts/liver/intestine, bile leak, and post cholecystectomy  diarrhea.  He understands this and is agreeable to the plan.  Will  follow him in the hospital.      Adolph Pollack, M.D.  Electronically Signed     TJR/MEDQ  D:  11/28/2006  T:  11/30/2006  Job:  [id number]   cc:   Wilhemina Bonito. Marina Goodell, MD  [address]  [place]  [place] [address]   Hedwig Morton. Juanda Chance, MD   [address]  [place]  [place] [address]   Gordy Savers, MD  [address] [place]  [place] [address]

## 2010-08-28 NOTE — Progress Notes (Signed)
Subjective:    Patient ID: Francisco Merritt, male    DOB: 1929-10-29, 77 y.o.   MRN: [id number]  HPI  77 year old patient who is seen today for an annual health assessment. He is followed closely by cardiology and has had a recent urological evaluation as well. He is doing quite well today and denies any cardiopulmonary complaints. He is scheduled for cardiology followup and pacemaker check next week. He is fasting today but did have some laboratory studies performed 3 months ago.  1. Risk factors, based on past  M,S,F history- patient has known coronary artery disease cardiovascular risk factors include dyslipidemia hypertension  2.  Physical activities: Remains quite active does complain of chronic fatigue but remains very active with gardening. Does use home exercise equipment periodically  3.  Depression/mood: No history depression or mood disorder  4.  Hearing: Mild to moderate impairment  5.  ADL's: Independent in all aspects of daily living  6.  Fall risk: Moderate due to mild gait instability  7.  Home safety: No problems identified  8.  Height weight, and visual acuity; height and weight stable states that his distance vision has fallen off a bit and is scheduled to see his eye doctor soon  9.  Counseling: Heart healthy diet more regular exercise and modest weight loss all encouraged  10. Lab orders based on risk factors: Laboratory profile from 2 months ago reviewed  36. Referral: Followup cardiology next week  12. Care plan: Heart healthy salt restricted diet encouraged regular exercise and modest weight loss recommended 13. Cognitive assessment: Alert and oriented with normal affect. No cognitive dysfunction     Alcohol-Tobacco  Smoking Status: never  Allergies:  1) ! Sulfa  Past History:  Past Medical History:  1. Paroxysmal atrial fibrillation with tachybrady syndrome.  --s/p [employer] pacemaker  --stqarted sotalol 3/11  2. One vessel coronary artery  disease with normal ejection fraction.  --Cath 9/08. EF normal. LM nl, LAD 40%, D2 occulded with collaterals, LCX 40%, RCA 30%  --Echo 11/10 EF 50-55% grade I diastolic dysfunction.  3. History of gallstones status post cholecystectomy.  4. Hypertension.  5. Hyperlipidemia.  6. History of peptic ulcer disease.  7. Osteoarthritis  8. Benign prostatic hypertrophy  9. Obesity  10.Nephrolithiasis, hx of  11. Diverticulosis, colon  12. Testosterone deficiency  ED  Past Surgical History:  Reviewed history from 09/06/2008 and no changes required.  Cholecystectomy  cardiac catheterization  colonoscopy 1999  upper gastroduodenoscopy, 1999  status post pacemaker  carotid artery.  Doppler evaluation  Family History:  Reviewed history from 06/02/2008 and no changes required.  father died at 49 of an MI. Mother died at 33 of hypertension, coronary disease, and palpitations of diabetes two brothers, positive for diabetes, hypertension, coronary artery disease  Social History:  Reviewed history from 06/02/2008 and no changes required.  He lives with his wife in [place], [place]. 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  No tobacco or alcohol use.     Review of Systems  Constitutional: Positive for fatigue. Negative for fever, chills, activity change and appetite change.  HENT: Negative for hearing loss, ear pain, congestion, rhinorrhea, sneezing, mouth sores, trouble swallowing, neck pain, neck stiffness, dental problem, voice change, sinus pressure and tinnitus.   Eyes: Negative for photophobia, pain, redness and visual disturbance.  Respiratory: Negative for apnea, cough, choking, chest tightness, shortness of breath and wheezing.   Cardiovascular: Negative for chest  pain, palpitations and leg swelling.  Gastrointestinal: Negative for nausea, vomiting, abdominal pain, diarrhea, constipation, blood in stool, abdominal distention, anal bleeding and  rectal pain.  Genitourinary: Negative for dysuria, urgency, frequency, hematuria, flank pain, decreased urine volume, discharge, penile swelling, scrotal swelling, difficulty urinating, genital sores and testicular pain.  Musculoskeletal: Negative for myalgias, back pain, joint swelling, arthralgias and gait problem.  Skin: Negative for color change, rash and wound.  Neurological: Negative for dizziness, tremors, seizures, syncope, facial asymmetry, speech difficulty, weakness, light-headedness, numbness and headaches.  Hematological: Negative for adenopathy. Does not bruise/bleed easily.  Psychiatric/Behavioral: Negative for suicidal ideas, hallucinations, behavioral problems, confusion, sleep disturbance, self-injury, dysphoric mood, decreased concentration and agitation. The patient is not nervous/anxious.        Objective:   Physical Exam  Constitutional: He appears well-developed and well-nourished.  HENT:  Head: Normocephalic and atraumatic.  Right Ear: External ear normal.  Left Ear: External ear normal.  Nose: Nose normal.  Mouth/Throat: Oropharynx is clear and moist.  Eyes: Conjunctivae and EOM are normal. Pupils are equal, round, and reactive to light. No scleral icterus.  Neck: Normal range of motion. Neck supple. No JVD present. No thyromegaly present.  Cardiovascular: Regular rhythm, normal heart sounds and intact distal pulses.  Exam reveals no gallop and no friction rub.   No murmur heard.      Pedal pulses intact  Pulmonary/Chest: Effort normal and breath sounds normal. He exhibits no tenderness.       Pacemaker noted left upper anterior chest wall  Abdominal: Soft. Bowel sounds are normal. He exhibits no distension and no mass. There is no tenderness.  Genitourinary: Penis normal.  Musculoskeletal: Normal range of motion. He exhibits edema. He exhibits no tenderness.       Trace pedal edema  Lymphadenopathy:    He has no cervical adenopathy.  Neurological: He is  alert. He has normal reflexes. No cranial nerve deficit. Coordination normal.  Skin: Skin is warm and dry. No rash noted.  Psychiatric: He has a normal mood and affect. His behavior is normal.          Assessment & Plan:   Coronary artery disease. Clinically stable Paroxysmal atrial fibrillation. Followup cardiology. Will continue on Coumadin anticoagulation Hypertension well controlled Dyslipidemia stable on simvastatin 20 mg daily

## 2010-08-28 NOTE — Op Note (Signed)
NAMEROBERTSON, COLCLOUGH              ACCOUNT NO.:  [id number]   MEDICAL RECORD NO.:  [id number]          PATIENT TYPE:  AMB   LOCATION:  ENDO                         [facility]ACILITY[facility]  [facility]   PHYSICIAN:  Wilhemina Bonito. Marina Goodell, MD      DATE OF BIRTH:  Aug 24, 1929   DATE OF PROCEDURE:  11/28/2006  DATE OF DISCHARGE:                               OPERATIVE REPORT   PROCEDURE PERFORMED:  Endoscopic retrograde cholangiography with biliary  sphincterotomy and common bile duct stone extraction.   ENDOSCOPIST:  Wilhemina Bonito. Marina Goodell, MD.   INDICATIONS:  Abdominal discomfort, weight loss, abnormal CT scan, and  abnormal liver function tests.   HISTORY:  This is a 77 year old gentleman with a history of  hypertension, who was undergoing an insurance physical about three  months ago.  He was found to have abnormal liver tests and referred to  Dr. Rene Kocher.  A CT scan of the abdomen and pelvis obtained  yesterday revealed dilation of intrahepatic and extrahepatic bile ducts  with a question of common duct stone.  As well, mild dilation of the  pancreatic duct.  No discrete lesion noted in the pancreas.  He was  admitted to the hospital for the purposes of ERCP with a possible  sphincterotomy, stone extraction, or stent placement depending upon the  findings.  The nature of the procedure as well as its risks, benefits,  and alternatives were reviewed carefully and in detail.  He understood  and agreed to proceed.   PHYSICAL EXAMINATION:  GENERAL:  Anxious but otherwise well-appearing  male in no acute distress, he is alert and oriented.  VITAL SIGNS:  Stable.  LUNGS:  Clear.  HEART:  Regular.  ABDOMEN:  Soft with minimal epigastric tenderness to palpation, good  bowel sounds heard.   PROCEDURE:  After informed consent was obtained, the patient was sedated  with 120 mcg of Fentanyl and 11 mg of Versed IV over the course of the  procedure.  As well, Glucagon 0.5 mg IV was given as a duodenal  relaxant.   Preoperative antibiotics were also provided.  The Pentax side-  viewing duodenoscope was then passed blindly into the esophagus.  The  stomach had some retained liquid gastric contents presumably from  breakfast.  No gross abnormalities of the stomach.  The duodenal bulb  was normal.  The postbulbar duodenum revealed moderate-sized  periampullary diverticulum.  Amidst the diverticulum was a normal-  appearing ampulla.   X-RAY FINDINGS:  1. Scout radiograph of the abdomen with the endoscope in position      revealed rib calcifications as well as some residual contrast      material from previous CT.  2. The common bile duct was selectively and deeply cannulated.      Filling of the bile duct revealed diffuse dilation throughout with      a maximal ductal diameter estimated to be 18 mm.  Within the duct      were several mobile-filling defects consistent with stones.  These      measured 15 mm, 10 mm, and 5 mm.  As well, there appeared  to be      small stones in the cystic duct.  THERAPY:  With a hydrophilic      guidewire in the proximal biliary tree, a biliary sphincterotomy      was made with cutting in the 12 o'clock orientation via the ERBE      System.  The bottom of the duct was generous-sized as was the      subsequent sphincterotomy.  After completing the sphincterotomy,      the cutting cannula was exchanged for a sequential balloon.  With      the balloon at 15 mm, all three stones were delivered into the      duodenum.  Post extraction occlusion cholangiogram revealed no      residual filling defects in the duct proper.  There still appeared      to be some small defects in the cystic duct.  3. No attempt at a pancreatogram was made.   IMPRESSION:  Symptomatic choledocholithiasis status post ERC with  sphincterotomy and stone extraction.   RECOMMENDATIONS:  1. Admit the patient for observation.  2. Continue antibiotics.  3. Surgical consultation for a laparoscopic  cholecystectomy.      Wilhemina Bonito. Marina Goodell, MD  Electronically Signed     JNP/MEDQ  D:  11/28/2006  T:  11/28/2006  Job:  [id number]   cc:   Gordy Savers, MD  [address] [place]  [place] [address]

## 2010-08-28 NOTE — Discharge Summary (Signed)
NAMETAVARI, LOADHOLT NO.:  [id number]   MEDICAL RECORD NO.:  [id number]          PATIENT TYPE:  OBV   LOCATION:  2002                         FACILITY:  [facility]   PHYSICIAN:  Doylene Canning. Ladona Ridgel, MD    DATE OF BIRTH:  06-22-1929   DATE OF ADMISSION:  02/12/2007  DATE OF DISCHARGE:  02/13/2007                               DISCHARGE SUMMARY   ALLERGIES:  SULFA.   TIME SPENT AT DISCHARGE:  Examination and dictation time greater than 45  minutes.   FINAL DIAGNOSES:  1. Discharging day 1 status post implantation of Guidant Insignia I      Plus DR dual-chamber pacemaker for symptomatic tachybrady syndrome.  2. The patient had atrial fibrillation with rapid ventricular rate on      the day after implantation which was treated with Toprol-XL 50 mg      daily, and the patient is now in atrial fibrillation with rates in      the 80s.  3. The patient had subxiphoid pain on postprocedure day #1 was causing      of quite some discomfort.  He responded to Protonix and a      gastrointestinal cocktail, but there was some concern that maybe      the patient had microperforation.  2-D echocardiogram was obtained      that showed small pericardial effusion.   SECONDARY DIAGNOSES:  1. Tachybrady syndrome.  2. Atrial fibrillation with rapid ventricular rate on chronic      Coumadin.  3. Bradycardia with beta blockers.  4. Status post cholecystectomy.  5. Left heart catheterization on December 25, 2006 with diastolic      dysfunction, ejection fraction 60%.  The second diagonal was      totaled.  It was small.  Medical management suggested.  A followup      Myoview was negative for ischemia.   PROCEDURE:  1. On February 12, 2007, implant of Guidant dual-chamber pacemaker Dr.      Lewayne Bunting.  The patient had postprocedural complications, as      mentioned above, which included atrial fibrillation with rapid      ventricular rate treated with Toprol-XL 50 mg and chest  discomfort      which subsided after GI cocktail.  2. A 2-D echocardiogram February 13, 2007.  This study showed that left      ventricular function overall is good.  The ejection fraction was      55%.  There was a small pericardial effusion posterior to the      heart.  Mr. Balsam will present the week of February 16, 2007 for      a followup 2-D echocardiogram to assess whether the pericardial      effusion has enlarged.  This patient is on Coumadin therapy.  The      patient is asked to keep his incision dry for next 7 days, to      sponge bathe until Thursday, February 19, 2007.   DISCHARGE MEDICATIONS:  1. Darvocet N 100, 1-2 tablets every 4-6 hours as needed for pain.  2. Simvastatin 20 mg at bedtime.  3. Terazosin 10 mg daily.  4. Enteric-coated aspirin 81 mg daily.  5. Coumadin 5 mg daily, except for 2.5 mg on Monday and Wednesday.  6. Vitamin C 500 mg daily.  7. Centrum Silver daily.  8. The patient is to continue taking his glucosamine, chondroitin, and      MSM as before this admission.  9. He also is going home on Toprol-XL 50 mg daily.  This is a new      medication.   FOLLOW UP:  He follows up at [facility] the week of February 16, 2007 for repeat 2-D echocardiogram.  The office will call with that  appointment.  He has the pacer clinic and blood thinner check on  Wednesday, February 25, 2007 at 9:40.  He sees Dr. Gala Romney on March 17, 2007 at 11:30.  He sees Dr. Ladona Ridgel in February.  Dr. Lubertha Basque office  will call with that appointment.   LABORATORY STUDIES THIS ADMISSION:  On February 13, 2007, the PT was  20.5, INR is 1.7.      Maple Mirza, PA      Doylene Canning. Ladona Ridgel, MD  Electronically Signed    GM/MEDQ  D:  02/13/2007  T:  02/15/2007  Job:  [id number]   cc:   Gordy Savers, MD  Doylene Canning. Ladona Ridgel, MD

## 2010-08-28 NOTE — Patient Instructions (Signed)
Limit your sodium (Salt) intake    It is important that you exercise regularly, at least 20 minutes 3 to 4 times per week.  If you develop chest pain or shortness of breath seek  medical attention.  Followup cardiology

## 2010-08-28 NOTE — Assessment & Plan Note (Signed)
[facility]                            CARDIOLOGY OFFICE NOTE   DIEM, PAGNOTTA                       MRN:          [id number]  DATE:01/14/2007                            DOB:          Oct 27, 1929    REFERRING PHYSICIAN:  Bevelyn Buckles. Bensimhon, MD   This is a pleasant, 77 year old white male, who recently underwent  cholecystectomy for acute on chronic cholecystitis.  Postoperatively, he  had chest pain in the setting of atrial fibrillation with rapid  ventricular response.  He ruled out for an MI with serial cardiac  markers, but had problems with tachy/brady syndrome.  He underwent  Myoview, which showed an EF of 60% with distal anterior and apical  hypokinesis with evidence of ischemia.  He, therefore, underwent cardiac  catheterization on December 25, 2006, by Dr. Gala Romney.  This revealed  one-vessel coronary artery disease with total occlusion of a second  diagonal branch with related wall motion abnormalities.  Overall normal  LV function with anterolateral hypokinesis, a tortuous right iliac  system.  Paroxysmal atrial fibrillation was evident during the case and  he had elevated left ventricular end diastolic pressure, consistent with  diastolic dysfunction.  Dr. Gala Romney recommended medical therapy of his  coronary artery disease and aggressive risk factor modification.   Patient has done well since he has been home.  He has not had any  further chest pain.  He does not notice palpitations, for the most part,  and has never felt his heart racing.  There has been concern for  bradycardia and Dr. Gala Romney was reluctant to start him on beta  blockers at this point.  He has not needed to use nitroglycerin and  feels better than ever since his surgery.   CURRENT MEDICATIONS:  1. Hytrin 10 mg daily.  2. Tylenol arthritis 2 daily.  3. Aspirin 81 mg daily.  4. Multivitamin daily.  5. Vitamin C 500 mg daily.  6. Glucosamine chondroitin  500/400 b.i.d.  7. Warfarin 5 mg as directed.  8. Simvastatin 20 mg daily.   PHYSICAL EXAM:  This is a pleasant, 77 year old white male, in no acute  distress.  Blood pressure 136/68, pulse 63.  NECK:  Without JVD, HJR, bruit or thyroid enlargement.  LUNGS:  Clear, anterior, posterior and lateral.  HEART:  Regular rate and rhythm at 60 beats per minute, normal S1 and  S2.  No murmur, rub, bruit, thrill or heave noted.  ABDOMEN:  Soft without organomegaly, masses, lesions or abnormal  tenderness.  EXTREMITIES:  Without cyanosis, clubbing or edema.  He has good distal  pulses.   IMPRESSION:  1. Coronary artery disease with total occlusion of the second      diagonal.  Ejection fraction 55% with mild to moderate hypokinesis      of the mid to distal anterior wall.  2. Paroxysmal atrial fibrillation, currently in normal sinus rhythm.  3. Hypertension.  4. Hyperlipidemia.  5. Recent cholecystectomy.   Electrocardiogram:  Normal sinus rhythm.  No acute change.   PLAN AT THIS TIME:  Patient is doing well from a cardiac  standpoint.  He  is in normal sinus rhythm today and I will have him see Dr. Gala Romney  back in two months to decide if he needs any further treatment for his  paroxysmal atrial fibrillation.      Jacolyn Reedy, PA-C  Electronically Signed      Everardo Beals. Juanda Chance, MD, [facility]  Electronically Signed   ML/MedQ  DD: 01/14/2007  DT: 01/14/2007  Job #: [id number]   cc:   Bevelyn Buckles. Bensimhon, MD

## 2010-08-28 NOTE — Assessment & Plan Note (Signed)
[facility]                         ELECTROPHYSIOLOGY OFFICE NOTE   JAYDIAN, SANTANA                       MRN:          [id number]  DATE:02/02/2008                            DOB:          31-Mar-1930    Mr. Mikaelian returns today for followup.  He is a very pleasant elderly  male with a history of coronary disease and symptomatic bradycardia who  is status post pacemaker insertion.  He denies chest pain or shortness  of breath.  He had no specific complaints today.  He does note concerns  about his 3 grown children who have Charcot-Marie-Tooth disease and are  handicapped.   Medications include:  1. Hytrin 10 mg a day.  2. Tylenol Arthritis twice daily as needed.  3. Aspirin 81 a day.  4. Warfarin 5 mg as directed.  5. Simvastatin 20 a day.  6. Metoprolol 50 twice a day.   PHYSICAL EXAMINATION:  GENERAL:  He is a pleasant, well-appearing man in  no distress.  VITAL SIGNS:  Blood pressure 126/76, the pulse 65 and regular, the  respirations were 18.  Weight was 222 pounds.  NECK:  No jugular venous distention.  LUNGS:  Clear bilaterally to auscultation.  No wheezes, rales, or  rhonchi were present.  CARDIOVASCULAR:  Regular rate and rhythm.  Normal S1 and S2.  No  murmurs, rubs, or gallops.  ABDOMEN:  Soft, nontender.  EXTREMITIES:  Demonstrate no cyanosis, clubbing, or edema.  The pulses  were 2+ and symmetric.   Interrogation of his pacemaker demonstrates a Guidant Insignia, the P-  waves were 2, the R-waves greater than 12, the impedance 470 in the A  and 610 in the V, the threshold 1 V at 0.4 in the atrium and the right  ventricle.  The patient was 24% A paced and 4% V paced.   IMPRESSION:  1. Symptomatic bradycardia.  2. Status post pacemaker insertion.  3. Coronary disease, presently stable.  4. Hypertension.   DISCUSSION:  Overall, Mr. Feldpausch is stable.  He continues to be quite  active.  I will plan to see the patient  back in office for followup in 1  year.     Doylene Canning. Ladona Ridgel, MD  Electronically Signed    GWT/MedQ  DD: 02/02/2008  DT: 02/03/2008  Job #: [id number]

## 2010-08-28 NOTE — Assessment & Plan Note (Signed)
[facility]                            CARDIOLOGY OFFICE NOTE   Francisco Merritt, Francisco Merritt                       MRN:          [id number]  DATE:12/22/2006                            DOB:          Jan 26, 1930    PRIMARY CARE PHYSICIAN:  Gordy Savers, M.D.   INTERVAL HISTORY:  [name] is a delightful 77 year old male with a  history of hypertension.  He does not have any known coronary artery  disease.  He was recently admitted for acute-on-chronic cholecystitis  and underwent ERCP followed by cholecystectomy.  Postoperatively, he had  some chest pain as well in the setting of atrial fibrillation with rapid  ventricular response.  During the postoperative course he also had  evidence of sick sinus syndrome with significant periods of bradycardia.  His AV nodal blocking agents had to be held.  He managed to recover.  He  had an echocardiogram which showed normal LV function.  He was  discharged home.  As an outpatient, he underwent an adenosine Myoview  which showed an ejection fraction of 60%.  There was a significant  ischemia in the anterior wall with perhaps mild transient ischemic  diltation of the left ventricle.  He returns today for postoperative  followup.   Overall, he says he is doing fairly well.  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  He  says occasionally when he is sitting on his recliner he feels a brief  flutter in his chest but no sustained palpitations, no chest pain or  pressure, no shortness of breath or other associated symptoms.   CURRENT MEDICATIONS:  1. Hytrin 10 mg a day.  2. Tylenol.  3. Aspirin 81.  4. Coumadin.  5. Vitamin C, zinc, and multivitamin.   PHYSICAL EXAMINATION:  He is well-appearing, no acute distress.  Ambulates around the clinic without any respiratory difficulty.  Blood  pressure is 130/60 with a heart rate of 62, weight is 206.  HEENT:   Normal.  NECK:  Supple, no JVD.  Carotids are 2+ bilaterally without any bruits.  There is no lymphadenopathy or thyromegaly.  CARDIAC:  PMI is nondisplaced.  There is a regular rate and rhythm.  No  murmurs, rubs or gallops.  LUNGS:  Clear.  ABDOMEN:  Obese, nontender, nondistended.  He has well-healing surgical  scars. Good bowel sounds.  EXTREMITIES:  Warm with no cyanosis, clubbing or edema.  No rash.  NEUROLOGIC:  He is alert and oriented x3.  Cranial nerves II-XII are  intact.  Moves all four extremities without difficulty.   ASSESSMENT AND PLAN:  1. Chest pain with an abnormal nuclear study.  His stress test is      quite abnormal and very concerning for a high-grade lesion in the      LAD.  We have discussed this and we all agree to proceed with      cardiac catheterization in the outpatient catheterization      laboratory.  2. Atrial fibrillation with sick sinus syndrome.  He  is now back in      sinus rhythm, but I do worry about paroxysmal atrial arrhythmias      and perhaps bradycardia.  We will put a 48-hour Holter monitor on      him.  For now I would not start a beta-blocking agent.   DISPOSITION:  Depending on his cardiac catheterization later in the  week.  We did give him a prescription for sublingual nitroglycerin and  he knows to go to the emergency room or call 9-1-1 should he have a  significant episode of chest discomfort.  We will also start simvastatin  20 mg a day.     Bevelyn Buckles. Bensimhon, MD  Electronically Signed    DRB/MedQ  DD: 12/22/2006  DT: 12/22/2006  Job #: [id number]   cc:   Gordy Savers, MD  Adolph Pollack, M.D.

## 2010-08-28 NOTE — Discharge Summary (Signed)
NAMEGABREIL, YONKERS NO.:  [id number]   MEDICAL RECORD NO.:  [id number]          PATIENT TYPE:  INP   LOCATION:  6533                         FACILITY:  [facility]   PHYSICIAN:  Bevelyn Buckles. Bensimhon, MDDATE OF BIRTH:  29-Apr-1929   DATE OF ADMISSION:  02/05/2007  DATE OF DISCHARGE:  02/06/2007                               DISCHARGE SUMMARY   ALLERGIES:  The patient has allergy to sulfa.   PRESENTING CIRCUMSTANCE:  I came to the office for routine physical.   HISTORY OF PRESENT ILLNESS:  Mr. Bergfeld is a 77 year old male who  presented to the office with Dr. Gala Romney at [facility] on  October 23.  He said he was feeling fairly well.  This was a routine  follow-up office visit.  An electrocardiogram was done.  It showed that  the patient had atrial fibrillation with rate of 153 beats per minute.  The patient was transferring directly to [facility] for rate  control and most likely permanent pacemaker.  There is some possibility  that an antiarrhythmic medication would be tried.   BACKGROUND:  Mr. Crescenzo had a cholecystectomy earlier this year for  acute on-chronic cholecystitis, postoperatively, he had chest pain with  concomitant atrial fibrillation, rapid ventricular response.  He ruled  out for myocardial infarction by means of serial cardiac enzyme markers,  but was severely bradycardic when he spontaneously converted to sinus  rhythm.  The patient has bradycardia with a very low doses of beta  blockade.   At the time of his admission with atrial fibrillation, he underwent a  stress Myoview study.  Ejection fraction was 60%, but there was distal  anterior and apical hypokinesis.  There was evidence of ischemia.  He  underwent left heart catheterization December 25, 2006. The study shows  one-vessel coronary artery disease in the second diagonal, which was  totally occluded.  There are related wall motion abnormalities.  During  the  catheterization, the patient was in atrial fibrillation and had  elevated left ventricular end-diastolic pressure, consistent with  diastolic dysfunction.  At that time, Dr. Gala Romney recommended  continued medical therapy for coronary artery disease, aggressive risk  factor reduction.   The patient now presents to [facility].  He will be seen by Dr.  Sherryl Manges with pacemaker as a definite possibility, if there is  scheduling time, Friday October 24.   HOSPITAL COURSE:  The patient admitted directly to [facility]  from the office of [facility] on October 23rd.  He was given a  IV Cardizem in the emergency room and converted to sinus rhythm.  Beta-  blockers were tried but placed on hold secondary to bradycardia.  The  patient does have a prior history of tachy-brady syndrome.  INR on  presentation was 2.7.  The patient says he has had no history of syncope  in the past.  He was seen by Dr. Sherryl Manges, who recommended and  agreed with the decision for pacemaker for tachybrady syndrome.  Scheduled constraints prohibited his implantation Friday October 24.  The patient was  ready for discharge and scheduled to return on Tuesday,  October 28th at 6:00, [facility] Stay [facility].  The patient will not go home  on a beta blockade.  He will continue his Coumadin as he has done, 5 mg  daily, except 2.5 mg on Wednesday.   OTHER MEDICATIONS:  Include:  1. Aspirin 81 mg daily.  2. Hytrin 10 mg daily.  3. Vitamin C 5 mg daily.  4. Glucosamine two times daily.  5. Simvastatin 20 mg daily at bedtime.  6. Centrum Silver multivitamin daily.  7. Tylenol arthritis as needed.   LABORATORY STUDIES PERTINENT TO THIS ADMISSION:  Complete blood count:  Hemoglobin 12.9, hematocrit 38.5, white cells of 5.4 and platelets of  234.  Serum electrolytes:  Sodium was 136, potassium 3.9, chloride 105,  carbonate 25, BUN is 12, creatinine 1.29, glucose is 91.  Pro-time this  admission on  October 23 was 29.8, INR was 2.7.  PTT is 49.  His alkaline  phosphatase was 54, SGOT 27, SGPT was 18.  No cardiac enzymes were  cycled this admission.  TSH this admission 1.028.      Maple Mirza, PA      Bevelyn Buckles. Bensimhon, MD  Electronically Signed    GM/MEDQ  D:  02/06/2007  T:  02/08/2007  Job:  [id number]   cc:   Bevelyn Buckles. Bensimhon, MD  Doylene Canning. Ladona Ridgel, MD

## 2010-08-28 NOTE — Op Note (Signed)
[name], Merritt NO.:  [id number]   MEDICAL RECORD NO.:  [id number]          PATIENT TYPE:  INP   LOCATION:  2002                         FACILITY:  [facility]   PHYSICIAN:  Doylene Canning. Ladona Ridgel, MD    DATE OF BIRTH:  1929/06/30   DATE OF PROCEDURE:  02/12/2007  DATE OF DISCHARGE:                               OPERATIVE REPORT   PROCEDURE PERFORMED:  Implantation of dual-chamber pacemaker.   INTRODUCTION:  The patient is a 77 year old male with a history of  symptomatic tachy-brady syndrome, atrial fibrillation with rapid  response as well as symptomatic bradycardia with heart rates in the 40s.  He is now referred for pacemaker insertion so that medications can be  used to control his ventricular rate.   PROCEDURE:  After informed consent was obtained, the patient was taken  to the diagnostic EP lab in the fasting state.  After the usual  preparation and draping, intravenous fentanyl and midazolam were given  for sedation.  lidocaine 30 mL was infiltrated into the left  infraclavicular region.  Ten milliliters of contrast was injected into  the left upper extremity venous system demonstrating a patent left  subclavian vein.  It was subsequently punctured and the Guidant Dextrus  model 4136 active-fixation atrial lead, serial number [id number], was  advanced into the right atrium and the Guidant Dextrus model 4137 active-  fixation RV lead, serial number [id number], was advanced to the right  ventricle.  Mapping was carried out first in the right ventricle.  At  the final site on the RV septum the R-waves were 13, the pacing  impedance was 668 ohms, the threshold 0.5 V at 0.5 msec.  Ten-volt  pacing did not stimulate the diaphragm.  With this lead in satisfactory  position, attention was then turned to placement of the atrial lead,  where the P-waves were 4, the pacing impedance was 630 ohms, the  threshold 0.4 V at 0.5 msec.  Again 10-volt pacing did not stimulate the  diaphragm.  With both the atrial and ventricular leads in satisfactory  position, they were secured to the subpectoralis fascia with a figure-of-  eight silk suture.  The sewing sleeve was also secured with silk suture.  Electrocautery was then utilized to make a subcutaneous pocket.  Kanamycin irrigation was utilized to irrigate the pocket and  electrocautery utilized to assure hemostasis.  The Guidant Insignia 1+  DR dual-chamber pacemaker, serial number [id number], was connected to the  atrial and ventricular leads and placed back in the subcutaneous pocket.  The generator was secured with silk suture.  Additional kanamycin was  utilized to irrigate the pocket.  The incision was closed with a layer  of 2-0 Vicryl, followed by a layer of 3-0 Vicryl, followed by a layer of  4-0 Vicryl.  Benzoin was painted on the skin, Steri-Strips were applied  and a pressure dressing was placed and the patient was returned to his  room in satisfactory condition.   COMPLICATIONS:  There were no immediate procedure complications.   RESULTS:  This demonstrates successful implantation of a Guidant dual-  chamber pacemaker  in a patient with symptomatic tachy-brady syndrome.     Doylene Canning. Ladona Ridgel, MD  Electronically Signed    GWT/MEDQ  D:  02/12/2007  T:  02/13/2007  Job:  [id number]   cc:   Bevelyn Buckles. Bensimhon, MD

## 2010-08-28 NOTE — Cardiovascular Report (Signed)
Francisco Merritt, Francisco Merritt NO.:  [id number]   MEDICAL RECORD NO.:  [id number]          PATIENT TYPE:  OIB   LOCATION:  1966                         FACILITY:  [facility]   PHYSICIAN:  Bevelyn Buckles. Bensimhon, MDDATE OF BIRTH:  Aug 23, 1929   DATE OF PROCEDURE:  12/25/2006  DATE OF DISCHARGE:  12/25/2006                            CARDIAC CATHETERIZATION   PRIMARY CARE PHYSICIAN:  Gordy Savers, MD   HISTORY:  Mr. Degante is a very pleasant 77 year old male who recently  underwent cholecystectomy for acute-on-chronic cholecystitis.  Postoperatively he had chest pain in the setting of atrial fibrillation  with rapid ventricular response.  He ruled out for myocardial infarction  with serial cardiac markers.  He has had some problems with tachybrady  syndrome.  He underwent a Myoview which showed ejection fraction of  about 60% with a distal anterior and apical hypokinesis with evidence of  ischemia.  He is thus referred for catheterization.  This was done in  the outpatient lab.   PROCEDURES PERFORMED:  1. Selective coronary angiography.  2. Left heart catheterization.  3. Left ventriculogram.   DESCRIPTION OF PROCEDURE:  The risks and indications were explained;  consent was signed, and placed on the chart.  The right groin area was  prepped and draped in routine sterile fashion and anesthetized with 1%  local lidocaine.  We initially started out with a 4-French arterial  sheath in the right femoral artery.  However, there was a significant  amount of tortuosity in the right iliac system; and we were unable to  adequately manipulate the catheters.  I then switched out to a long 5-  [place] sheath and tried multiple catheters, but were still unable to  adequately manipulate the catheters into the left main.  We finally  switched out for a 6-French braided 40 cm sheath which was placed over a  wire.  This was done without complications.  We then were able to easily  cannulate the left main with a 6-French, JL-5 catheter.  A 3-DRC  catheter was used for the right coronary artery, and an angled pigtail  was used for the left ventriculogram.  All catheter exchanges were made  over a wire.  There no apparent complications.  Central aortic pressure  was 154/75 with a mean 0106.  LV pressure was 144/12 and an EDP of 20.  There was no aortic stenosis.   FINDINGS:  1. Left main coronary artery was free of significant stenosis.  2. The LAD came off in sort of a ramus orientation.  It was heavily      calcified throughout the proximal portion.  It gave off a high      first diagonal, a long second diagonal, and a small third diagonal.      There was also a prominent septal perforator.  There was a 40%      lesion in the proximal-to-mid LAD at the takeoff of where the      second diagonal originated previously.  The second diagonal was      totally occluded ostially with a very faint distal filling.  It was      heavily calcified and appeared chronic.  3. Left circumflex was a large vessel.  It gave off a large branching      OM-1.  There was a 40% tubular lesion in the midsection.  4. Right coronary artery was a large dominant vessel.  It gave off an      acute marginal branch, a small PDA, and 3 posterolaterals.  There      was a 30% lesion in the proximal right coronary artery, otherwise      just minor luminal irregularities.  5. Left ventriculogram done in the RAO position showed an EF of 55%      with mild-to-moderate hypokinesis of the mid-to-distal      anterolateral wall.   ASSESSMENT AND PLAN:  1. A 1-vessel coronary artery disease with evidence of a totally      occluded second diagonal branch and related wall motion      abnormality.  2. Overall normal LV function with anterolateral hypokinesis.  3. Tortuous right iliac system.  4. Paroxysmal atrial fibrillation which was evident during the case.  5. Elevated left ventricular end-diastolic  pressure consistent with      diastolic dysfunction.   PLAN/DISCUSSION:  At this point I would recommend medical therapy of his  coronary artery disease and continued aggressive risk factor  modification.      Bevelyn Buckles. Bensimhon, MD  Electronically Signed     DRB/MEDQ  D:  12/25/2006  T:  12/26/2006  Job:  [id number]   cc:   Gordy Savers, MD

## 2010-08-30 ENCOUNTER — Encounter: Payer: Self-pay | Admitting: Internal Medicine

## 2010-08-31 NOTE — Assessment & Plan Note (Signed)
[facility] OFFICE NOTE   Francisco Merritt, Francisco Merritt                       MRN:          [id number]  DATE:08/18/2006                            DOB:          1929-06-22    A 77 year old gentleman seen today for an annual exam.  He has a history  of hypertension, BPH, DJD, remote peptic ulcer disease as well as  exogenous obesity.  He has had no prior surgery.  He does have a history  of kidney stone disease.  He was last hospitalized in 1999 for acute GI  bleed.  He had a full GI evaluation at that time.   FAMILY HISTORY:  Unchanged.  Father died of an MI at 71.  Mother died of  complications of diabetes and coronary artery disease.  One of two  brothers deceased from cerebral and cardiovascular disease.   EXAM:  An overweight male in no acute distress.  Blood pressure was  130/70.  Skin negative.  FUNDI, EAR, NOSE AND THROAT:  Clear.  Patient was edentulous.  NECK:  No bruits.  CHEST:  Clear.  CARDIOVASCULAR:  Normal S1, S2, no murmurs.  ABDOMEN:  Obese, soft and nontender.  No organomegaly.  EXTERNAL GENITALIA:  Normal.  RECTAL EXAM:  Prostate +3 and benign, stool heme negative.  EXTREMITIES:  Intact peripheral pulses, no edema.  NEURO:  Negative.   IMPRESSION:  1. Hypertension.  2. Benign prostatic hypertrophy.  3. Degenerative joint disease.   DISPOSITION:  Medical regimen unchanged.  Will reassess in 6 months.     Gordy Savers, MD  Electronically Signed    PFK/MedQ  DD: 08/18/2006  DT: 08/18/2006  Job #: [id number]

## 2010-09-04 ENCOUNTER — Ambulatory Visit (INDEPENDENT_AMBULATORY_CARE_PROVIDER_SITE_OTHER): Payer: Medicare PPO | Admitting: Internal Medicine

## 2010-09-04 ENCOUNTER — Encounter: Payer: Self-pay | Admitting: Internal Medicine

## 2010-09-04 VITALS — BP 122/58 | HR 66 | Resp 18 | Ht 70.0 in | Wt 216.0 lb

## 2010-09-04 DIAGNOSIS — I2510 Atherosclerotic heart disease of native coronary artery without angina pectoris: Secondary | ICD-10-CM

## 2010-09-04 DIAGNOSIS — I4891 Unspecified atrial fibrillation: Secondary | ICD-10-CM

## 2010-09-04 DIAGNOSIS — R5383 Other fatigue: Secondary | ICD-10-CM

## 2010-09-04 DIAGNOSIS — R5381 Other malaise: Secondary | ICD-10-CM

## 2010-09-04 NOTE — Assessment & Plan Note (Signed)
Maintaining SR on sotalol. We again discussed the possibility of switching Sotalol to Tikosyn given fatigue and he did not feel that this would help much. Continue coumadin.

## 2010-09-04 NOTE — Assessment & Plan Note (Signed)
No evidence of ischemia. Continue current regimen.   

## 2010-09-04 NOTE — Progress Notes (Signed)
HPI:  Francisco Merritt is a 77 yo male with a history of obesity, hypertension, one-vessel coronary artery disease with a totally occluded diagonal branch by catheterization. He also history of atrial fibrillation, complicated by tachybradycardia syndrome and is status post pacemaker implantation and takes coumadin.  He is also on sotalol therapy.  His beta blocker was discontinued recently and his diltiazem was changed to amlodipine due to low heart rates.   He has been struggling with persistent fatigue. At last visit in Februrary we discussed the possibility of changing Sotalol to Tikosyn to see if this would help but he decided to defer. Bloodwork including CBC, TSH and BMET have all been normal.   He returns for scheduled f/u. Still feels rundown. Says he thinks it may be low testosterone.  Was using androgel without much benefit. 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 Occasionally gets on his exercise machine.   He denies palpitations, chest pain, shortness of breath, syncope, orthopnea, PND or significant pedal edema.   ROS: All systems negative except as listed in HPI, PMH and Problem List.  Past Medical History  Diagnosis Date  . Paroxysmal atrial fibrillation     s/p Guidant Insignia pacemaker; started sotalol 06/2009  . Tachy-brady syndrome   . Coronary artery disease     one vessel CAD with normal EF; Cath 12/2006. Ef normal. LM nl, LAD 40%, D2 occulded with collaterals, LCX 40%, RCA 30%; Echo 02/2009 EF 50-55% grade I diastolic dysfunction.  Marland Kitchen History of gallstones     status post cholecystectomy  . Hypertension   . Hyperlipidemia   . History of peptic ulcer disease   . Osteoarthritis   . Benign prostatic hypertrophy   . Obesity   . History of nephrolithiasis   . Diverticulosis of colon   . Testosterone deficiency   . ED (erectile dysfunction)     Current Outpatient Prescriptions  Medication Sig Dispense Refill  .  acetaminophen (TYLENOL ARTHRITIS PAIN) 650 MG CR tablet Take 650 mg by mouth as needed.        Marland Kitchen amLODipine (NORVASC) 10 MG tablet Take 1 tablet (10 mg total) by mouth daily.  90 tablet  6  . Ascorbic Acid (VITAMIN C) 500 MG tablet Take 500 mg by mouth daily.        Marland Kitchen aspirin 81 MG tablet Take 81 mg by mouth daily.        . furosemide (LASIX) 20 MG tablet Take 1 tablet (20 mg total) by mouth daily.  90 tablet  6  . glucosamine-chondroitin 500-400 MG tablet Take 1 tablet by mouth 2 (two) times daily.        . Multiple Vitamin (MULTIVITAMIN) tablet Take 1 tablet by mouth daily.        . nitroGLYCERIN (NITROSTAT) 0.4 MG SL tablet Place 1 tablet (0.4 mg total) under the tongue every 5 (five) minutes as needed.  90 tablet  6  . ranitidine (ZANTAC) 150 MG capsule Take 150 mg by mouth 2 (two) times daily.        . simvastatin (ZOCOR) 20 MG tablet Take 1 tablet (20 mg total) by mouth at bedtime.  6 tablet  6  . sotalol (BETAPACE) 80 MG tablet Take 80 mg by mouth 2 (two) times daily.        Marland Kitchen terazosin (HYTRIN) 10 MG capsule Take 1 capsule (10 mg total) by mouth daily.  90 capsule  6  . warfarin (COUMADIN) 5 MG tablet Take 1 tablet (5 mg total) by mouth daily.  90 tablet  1  . DISCONTD: omeprazole (PRILOSEC) 20 MG capsule Take 1 capsule (20 mg total) by mouth daily.  90 capsule  6  . DISCONTD: potassium chloride SA (K-DUR,KLOR-CON) 20 MEQ tablet          PHYSICAL EXAM: Filed Vitals:   09/04/10 1005  BP: 122/58  Pulse: 66  Resp: 18   General:  Well appearing. No resp difficulty HEENT: normal Neck: supple. JVP flat. Carotids 2+ bilaterally; no bruits. No lymphadenopathy or thryomegaly appreciated. Cor: PMI normal. Regular rate & rhythm. No rubs, gallops or murmurs. Lungs: clear Abdomen: soft, nontender, nondistended. No hepatosplenomegaly. No bruits or masses. Good bowel sounds. Extremities: no cyanosis, clubbing, rash, edema Neuro: alert & orientedx3, cranial nerves grossly intact. Moves all 4  extremities w/o difficulty. Affect pleasant.    ECG: NSR 66. 1AVB 208. No ST-T wave abnormalities. QTc 454    ASSESSMENT & PLAN:

## 2010-09-04 NOTE — Patient Instructions (Signed)
Your physician has recommended that you have a cardiopulmonary stress test (CPX). CPX testing is a non-invasive measurement of heart and lung function. It replaces a traditional treadmill stress test. This type of test provides a tremendous amount of information that relates not only to your present condition but also for future outcomes. This test combines measurements of you ventilation, respiratory gas exchange in the lungs, electrocardiogram (EKG), blood pressure and physical response before, during, and following an exercise protocol. Your physician wants you to follow-up in: 6 months.  You will receive a reminder letter in the mail two months in advance. If you don't receive a letter, please call our office to schedule the follow-up appointment.

## 2010-09-04 NOTE — Assessment & Plan Note (Signed)
Remains persistent but overall seems to be very active for a man his age. We discussed possibility of CPX testing to objectively evaluate functional capacity and chronotropic competence. He is amenable to that.

## 2010-09-22 ENCOUNTER — Other Ambulatory Visit: Payer: Self-pay | Admitting: Internal Medicine

## 2010-09-26 ENCOUNTER — Ambulatory Visit (INDEPENDENT_AMBULATORY_CARE_PROVIDER_SITE_OTHER): Payer: Medicare PPO | Admitting: *Deleted

## 2010-09-26 ENCOUNTER — Ambulatory Visit (HOSPITAL_COMMUNITY): Payer: Medicare PPO | Attending: Internal Medicine

## 2010-09-26 ENCOUNTER — Encounter: Payer: Medicare PPO | Admitting: *Deleted

## 2010-09-26 DIAGNOSIS — I4891 Unspecified atrial fibrillation: Secondary | ICD-10-CM

## 2010-09-26 DIAGNOSIS — R0602 Shortness of breath: Secondary | ICD-10-CM | POA: Insufficient documentation

## 2010-09-26 DIAGNOSIS — Z7901 Long term (current) use of anticoagulants: Secondary | ICD-10-CM

## 2010-09-26 DIAGNOSIS — R5381 Other malaise: Secondary | ICD-10-CM | POA: Insufficient documentation

## 2010-09-26 DIAGNOSIS — R5383 Other fatigue: Secondary | ICD-10-CM | POA: Insufficient documentation

## 2010-09-26 LAB — POCT INR: INR: 2.6

## 2010-09-27 ENCOUNTER — Other Ambulatory Visit: Payer: Self-pay | Admitting: *Deleted

## 2010-09-27 MED ORDER — SOTALOL HCL 80 MG PO TABS: 80.0000 mg | ORAL_TABLET | Freq: Two times a day (BID) | ORAL | Status: DC

## 2010-10-23 ENCOUNTER — Other Ambulatory Visit: Payer: Self-pay | Admitting: Internal Medicine

## 2010-10-24 ENCOUNTER — Ambulatory Visit (INDEPENDENT_AMBULATORY_CARE_PROVIDER_SITE_OTHER): Payer: Medicare PPO | Admitting: *Deleted

## 2010-10-24 DIAGNOSIS — I4891 Unspecified atrial fibrillation: Secondary | ICD-10-CM

## 2010-10-24 DIAGNOSIS — Z7901 Long term (current) use of anticoagulants: Secondary | ICD-10-CM

## 2010-10-28 ENCOUNTER — Other Ambulatory Visit: Payer: Self-pay | Admitting: Internal Medicine

## 2010-11-16 ENCOUNTER — Encounter: Payer: Self-pay | Admitting: Internal Medicine

## 2010-11-16 DIAGNOSIS — I495 Sick sinus syndrome: Secondary | ICD-10-CM

## 2010-11-21 ENCOUNTER — Ambulatory Visit (INDEPENDENT_AMBULATORY_CARE_PROVIDER_SITE_OTHER): Payer: Medicare PPO | Admitting: *Deleted

## 2010-11-21 DIAGNOSIS — Z7901 Long term (current) use of anticoagulants: Secondary | ICD-10-CM

## 2010-11-21 DIAGNOSIS — I4891 Unspecified atrial fibrillation: Secondary | ICD-10-CM

## 2010-12-19 ENCOUNTER — Ambulatory Visit (INDEPENDENT_AMBULATORY_CARE_PROVIDER_SITE_OTHER): Payer: Medicare PPO | Admitting: *Deleted

## 2010-12-19 DIAGNOSIS — Z7901 Long term (current) use of anticoagulants: Secondary | ICD-10-CM

## 2010-12-19 DIAGNOSIS — I4891 Unspecified atrial fibrillation: Secondary | ICD-10-CM

## 2011-01-11 ENCOUNTER — Encounter: Payer: Self-pay | Admitting: Internal Medicine

## 2011-01-18 ENCOUNTER — Ambulatory Visit (INDEPENDENT_AMBULATORY_CARE_PROVIDER_SITE_OTHER): Payer: Medicare PPO | Admitting: *Deleted

## 2011-01-18 ENCOUNTER — Encounter: Payer: Self-pay | Admitting: Internal Medicine

## 2011-01-18 ENCOUNTER — Ambulatory Visit (INDEPENDENT_AMBULATORY_CARE_PROVIDER_SITE_OTHER): Payer: Medicare PPO | Admitting: Internal Medicine

## 2011-01-18 DIAGNOSIS — I10 Essential (primary) hypertension: Secondary | ICD-10-CM

## 2011-01-18 DIAGNOSIS — E785 Hyperlipidemia, unspecified: Secondary | ICD-10-CM

## 2011-01-18 DIAGNOSIS — Z7901 Long term (current) use of anticoagulants: Secondary | ICD-10-CM

## 2011-01-18 DIAGNOSIS — I495 Sick sinus syndrome: Secondary | ICD-10-CM

## 2011-01-18 DIAGNOSIS — Z950 Presence of cardiac pacemaker: Secondary | ICD-10-CM

## 2011-01-18 DIAGNOSIS — I4891 Unspecified atrial fibrillation: Secondary | ICD-10-CM

## 2011-01-18 LAB — PACEMAKER DEVICE OBSERVATION
BRDY-0002RV: 60 {beats}/min
BRDY-0003RV: 120 {beats}/min
BRDY-0004RV: 130 {beats}/min
DEVICE MODEL PM: 314486

## 2011-01-18 LAB — POCT INR: INR: 2.6

## 2011-01-18 NOTE — Patient Instructions (Addendum)
Your physician wants you to follow-up in: YEAR WITH DR TAYLOR You will receive a reminder letter in the mail two months in advance. If you don't receive a letter, please call our office to schedule the follow-up appointment. Your physician recommends that you continue on your current medications as directed. Please refer to the Current Medication list given to you today. 

## 2011-01-18 NOTE — Assessment & Plan Note (Signed)
His blood pressure today is well controlled. I've asked that he maintain a low-sodium diet and continue his current medical therapy.

## 2011-01-18 NOTE — Assessment & Plan Note (Signed)
He will continue his current medications and maintain a low-fat low-sodium diet. He remains active and will continue do so.

## 2011-01-18 NOTE — Progress Notes (Signed)
HPI [name] returns today for followup. He is a pleasant, obese, 77 year old man with a history of symptomatic bradycardia status post pacemaker insertion. He also has hypertension but is bothered as much as anything by arthritis. Despite this he is able raise her garden and remained quite active. Allergies  Allergen Reactions  . Sulfonamide Derivatives     REACTION: n/v     Current Outpatient Prescriptions  Medication Sig Dispense Refill  . acetaminophen (TYLENOL ARTHRITIS PAIN) 650 MG CR tablet Take 650 mg by mouth as needed.        Marland Kitchen amLODipine (NORVASC) 10 MG tablet Take 1 tablet (10 mg total) by mouth daily.  90 tablet  6  . Ascorbic Acid (VITAMIN C) 500 MG tablet Take 500 mg by mouth daily.        Marland Kitchen aspirin 81 MG tablet Take 81 mg by mouth daily.        . furosemide (LASIX) 20 MG tablet Take 1 tablet (20 mg total) by mouth daily.  90 tablet  6  . glucosamine-chondroitin 500-400 MG tablet Take 1 tablet by mouth 2 (two) times daily.        . Multiple Vitamin (MULTIVITAMIN) tablet Take 1 tablet by mouth daily.        . nitroGLYCERIN (NITROSTAT) 0.4 MG SL tablet Place 1 tablet (0.4 mg total) under the tongue every 5 (five) minutes as needed.  90 tablet  6  . simvastatin (ZOCOR) 20 MG tablet TAKE ONE TABLET BY MOUTH ONE TIME DAILY  90 tablet  5  . sotalol (BETAPACE) 80 MG tablet Take 1 tablet (80 mg total) by mouth 2 (two) times daily.  120 tablet  5  . terazosin (HYTRIN) 10 MG capsule TAKE ONE CAPSULE BY MOUTH ONE TIME DAILY  90 capsule  3  . warfarin (COUMADIN) 5 MG tablet Take 1 tablet (5 mg total) by mouth daily.  90 tablet  1     Past Medical History  Diagnosis Date  . Paroxysmal atrial fibrillation     s/p Guidant Insignia pacemaker; started sotalol 06/2009  . Tachy-brady syndrome   . Coronary artery disease     one vessel CAD with normal EF; Cath 12/2006. Ef normal. LM nl, LAD 40%, D2 occulded with collaterals, LCX 40%, RCA 30%; Echo 02/2009 EF 50-55% grade I diastolic  dysfunction.  Marland Kitchen History of gallstones     status post cholecystectomy  . Hypertension   . Hyperlipidemia   . History of peptic ulcer disease   . Osteoarthritis   . Benign prostatic hypertrophy   . Obesity   . History of nephrolithiasis   . Diverticulosis of colon   . Testosterone deficiency   . ED (erectile dysfunction)     ROS:   All systems reviewed and negative except as noted in the HPI.   Past Surgical History  Procedure Date  . Cholecystectomy   . Insert / replace / remove pacemaker   . Cardiac catheterization   . Colonoscopy 1999  . Upper gastroduodenoscopy 1999  . Doppler evaluation      Family History  Problem Relation Age of Onset  . Hypertension Mother   . Heart disease Mother   . Diabetes Mother   . Heart attack Father   . Diabetes Brother   . Hypertension Brother   . Heart disease Brother   . Diabetes Brother   . Hypertension Brother   . Heart disease Brother      History   Social History  .  Marital Status: Married    Spouse Name: N/A    Number of Children: N/A  . Years of Education: N/A   Occupational History  . Not on file.   Social History Main Topics  . Smoking status: Never Smoker   . Smokeless tobacco: Never Used  . Alcohol Use: No  . Drug Use: No  . Sexually Active: Not on file   Other Topics Concern  . Not on file   Social History Narrative  . No narrative on file     BP 112/79  Pulse 95  Ht 5\' 10"  (1.778 m)  Wt 218 lb 6.4 oz (99.066 kg)  BMI 31.34 kg/m2  Physical Exam:  Well appearing obese, older man, NAD HEENT: Unremarkable Neck:  No JVD, no thyromegally Lymphatics:  No adenopathy Back:  No CVA tenderness Lungs:  Clear no wheezes, rales, or rhonchi. Well-healed ICD incision. HEART:  Regular rate rhythm, no murmurs, no rubs, no clicks Abd:  soft, positive bowel sounds, no organomegally, no rebound, no guarding Ext:  2 plus pulses, no edema, no cyanosis, no clubbing Skin:  No rashes no nodules Neuro:  CN  II through XII intact, motor grossly intact  DEVICE  Normal device function.  See PaceArt for details.   Assess/Plan:

## 2011-01-18 NOTE — Assessment & Plan Note (Signed)
His device is working normally. Will recheck in several months. 

## 2011-01-23 LAB — DIFFERENTIAL
Basophils Absolute: 0
Basophils Relative: 0
Eosinophils Absolute: 0.1
Eosinophils Relative: 3
Monocytes Absolute: 0.7
Neutro Abs: 3.7

## 2011-01-23 LAB — CBC
HCT: 38.5 — ABNORMAL LOW
Hemoglobin: 12.9 — ABNORMAL LOW
MCHC: 33.6
RBC: 4 — ABNORMAL LOW
RDW: 13.9

## 2011-01-23 LAB — COMPREHENSIVE METABOLIC PANEL
ALT: 18
AST: 27
Albumin: 3.6
Alkaline Phosphatase: 54
BUN: 12
Chloride: 105
Potassium: 3.9
Total Bilirubin: 0.7

## 2011-01-23 LAB — APTT: aPTT: 43 — ABNORMAL HIGH

## 2011-01-23 LAB — PROTIME-INR
INR: 1.7 — ABNORMAL HIGH
INR: 1.9 — ABNORMAL HIGH
Prothrombin Time: 20.5 — ABNORMAL HIGH
Prothrombin Time: 22.7 — ABNORMAL HIGH

## 2011-01-25 LAB — CK TOTAL AND CKMB (NOT AT ARMC): Relative Index: INVALID

## 2011-01-25 LAB — PROTIME-INR
INR: 1.1
INR: 1.1
Prothrombin Time: 14.3
Prothrombin Time: 17.2 — ABNORMAL HIGH

## 2011-01-25 LAB — CBC
HCT: 34.6 — ABNORMAL LOW
Hemoglobin: 11.8 — ABNORMAL LOW
MCHC: 34
MCHC: 34.2
MCV: 93.9
MCV: 94.7
MCV: 94.8
MCV: 95.5
Platelets: 301
Platelets: 313
RBC: 3.48 — ABNORMAL LOW
RDW: 14.1 — ABNORMAL HIGH
RDW: 14.2 — ABNORMAL HIGH
WBC: 10.2
WBC: 7.4

## 2011-01-25 LAB — COMPREHENSIVE METABOLIC PANEL
ALT: 71 — ABNORMAL HIGH
AST: 108 — ABNORMAL HIGH
AST: 55 — ABNORMAL HIGH
Albumin: 2.4 — ABNORMAL LOW
Albumin: 2.8 — ABNORMAL LOW
Alkaline Phosphatase: 199 — ABNORMAL HIGH
BUN: 13
BUN: 4 — ABNORMAL LOW
Calcium: 8.9
Calcium: 8.9
Chloride: 110
Creatinine, Ser: 1.16
Creatinine, Ser: 1.18
Creatinine, Ser: 1.34
GFR calc Af Amer: >60
GFR calc Af Amer: >60
GFR calc non Af Amer: 52 — ABNORMAL LOW
GFR calc non Af Amer: >60
GFR calc non Af Amer: >60
Glucose, Bld: 100 — ABNORMAL HIGH
Glucose, Bld: 121 — ABNORMAL HIGH
Potassium: 3.6
Sodium: 141
Total Bilirubin: 0.7
Total Protein: 6.8

## 2011-01-25 LAB — HEPATIC FUNCTION PANEL
ALT: 84 — ABNORMAL HIGH
AST: 75 — ABNORMAL HIGH
Albumin: 2.5 — ABNORMAL LOW
Alkaline Phosphatase: 279 — ABNORMAL HIGH
Total Bilirubin: 1.2
Total Protein: 6.1

## 2011-01-25 LAB — BASIC METABOLIC PANEL
CO2: 25
CO2: 27
Calcium: 8.5
Chloride: 107
Chloride: 110
Creatinine, Ser: 1.18
Creatinine, Ser: 1.26
GFR calc Af Amer: >60
GFR calc Af Amer: >60
Glucose, Bld: 118 — ABNORMAL HIGH
Potassium: 3.8
Sodium: 137

## 2011-01-25 LAB — CEA: CEA: 1.9

## 2011-01-25 LAB — CARDIAC PANEL(CRET KIN+CKTOT+MB+TROPI)
CK, MB: 2.5
Relative Index: INVALID
Relative Index: INVALID
Total CK: 62
Troponin I: 0.02
Troponin I: 0.02

## 2011-01-25 LAB — DIFFERENTIAL
Eosinophils Absolute: 0.2
Lymphocytes Relative: 10 — ABNORMAL LOW
Lymphs Abs: 1
Monocytes Relative: 12 — ABNORMAL HIGH
Neutrophils Relative %: 77

## 2011-01-25 LAB — TSH: TSH: 1.626

## 2011-01-25 LAB — TROPONIN I: Troponin I: 0.03

## 2011-02-15 ENCOUNTER — Ambulatory Visit (INDEPENDENT_AMBULATORY_CARE_PROVIDER_SITE_OTHER): Payer: Medicare PPO | Admitting: *Deleted

## 2011-02-15 DIAGNOSIS — I4891 Unspecified atrial fibrillation: Secondary | ICD-10-CM

## 2011-02-15 DIAGNOSIS — Z7901 Long term (current) use of anticoagulants: Secondary | ICD-10-CM

## 2011-02-18 ENCOUNTER — Encounter: Payer: Self-pay | Admitting: Internal Medicine

## 2011-02-18 DIAGNOSIS — I495 Sick sinus syndrome: Secondary | ICD-10-CM

## 2011-02-28 ENCOUNTER — Ambulatory Visit (INDEPENDENT_AMBULATORY_CARE_PROVIDER_SITE_OTHER): Payer: Medicare PPO | Admitting: Internal Medicine

## 2011-02-28 ENCOUNTER — Encounter: Payer: Self-pay | Admitting: Internal Medicine

## 2011-02-28 ENCOUNTER — Telehealth: Payer: Self-pay | Admitting: Internal Medicine

## 2011-02-28 DIAGNOSIS — I5032 Chronic diastolic (congestive) heart failure: Secondary | ICD-10-CM

## 2011-02-28 DIAGNOSIS — I4891 Unspecified atrial fibrillation: Secondary | ICD-10-CM

## 2011-02-28 DIAGNOSIS — I2510 Atherosclerotic heart disease of native coronary artery without angina pectoris: Secondary | ICD-10-CM

## 2011-02-28 DIAGNOSIS — I10 Essential (primary) hypertension: Secondary | ICD-10-CM

## 2011-02-28 DIAGNOSIS — R0989 Other specified symptoms and signs involving the circulatory and respiratory systems: Secondary | ICD-10-CM

## 2011-02-28 DIAGNOSIS — I509 Heart failure, unspecified: Secondary | ICD-10-CM

## 2011-02-28 DIAGNOSIS — N400 Enlarged prostate without lower urinary tract symptoms: Secondary | ICD-10-CM

## 2011-02-28 MED ORDER — TAMSULOSIN HCL 0.4 MG PO CAPS: 0.4000 mg | ORAL_CAPSULE | ORAL | Status: DC

## 2011-02-28 NOTE — Telephone Encounter (Signed)
Attempt to call pharmacy = doctor line - LMTCB if questions - continue with rx as prescribed. Aware of allergy

## 2011-02-28 NOTE — Patient Instructions (Signed)
Limit your sodium (Salt) intake    It is important that you exercise regularly, at least 20 minutes 3 to 4 times per week.  If you develop chest pain or shortness of breath seek  medical attention.  Follow up cardiology  Discontinue Hytrin  Return in 3 months for follow-up

## 2011-02-28 NOTE — Telephone Encounter (Signed)
[facility] called re: Tamsulosin HCl (FLOMAX) 0.4 MG CAPS. Pt has sulfa allergy, does doctor still want to prescribe this med?

## 2011-02-28 NOTE — Telephone Encounter (Signed)
Ok to RX?

## 2011-02-28 NOTE — Progress Notes (Signed)
  Subjective:    Patient ID: Francisco Merritt, male    DOB: 1929/12/29, 77 y.o.   MRN: [id number]  HPI  77 year old patient who is seen today for followup. He is followed closely by cardiology with history of coronary artery disease chronic diastolic heart failure. He remains on Coumadin anticoagulation. He has a history of BPH. He complains of gait instability. He has fallen several times. Does not give a clear history of orthostatic symptoms or dizziness. Blood pressure today was in the low-normal range Denies any symptoms of congestive heart failure. Medical regimen includes Hytrin    Review of Systems  Constitutional: Negative for fever, chills, appetite change and fatigue.  HENT: Negative for hearing loss, ear pain, congestion, sore throat, trouble swallowing, neck stiffness, dental problem, voice change and tinnitus.   Eyes: Negative for pain, discharge and visual disturbance.  Respiratory: Negative for cough, chest tightness, wheezing and stridor.   Cardiovascular: Negative for chest pain, palpitations and leg swelling.  Gastrointestinal: Negative for nausea, vomiting, abdominal pain, diarrhea, constipation, blood in stool and abdominal distention.  Genitourinary: Negative for urgency, hematuria, flank pain, discharge, difficulty urinating and genital sores.  Musculoskeletal: Positive for gait problem. Negative for myalgias, back pain, joint swelling and arthralgias.  Skin: Negative for rash.  Neurological: Negative for dizziness, syncope, speech difficulty, weakness, numbness and headaches.  Hematological: Negative for adenopathy. Does not bruise/bleed easily.  Psychiatric/Behavioral: Negative for behavioral problems and dysphoric mood. The patient is not nervous/anxious.        Objective:   Physical Exam  Constitutional: He is oriented to person, place, and time. He appears well-developed.       Blood pressure 110/76  HENT:  Head: Normocephalic.  Right Ear: External ear normal.    Left Ear: External ear normal.  Eyes: Conjunctivae and EOM are normal.  Neck: Normal range of motion.  Cardiovascular: Normal rate, regular rhythm, normal heart sounds and intact distal pulses.   Pulmonary/Chest: Breath sounds normal.  Abdominal: Bowel sounds are normal.  Musculoskeletal: Normal range of motion. He exhibits no edema and no tenderness.  Neurological: He is alert and oriented to person, place, and time.  Psychiatric: He has a normal mood and affect. His behavior is normal.          Assessment & Plan:   Hypertension/BPH. Blood pressure is in a low-normal range patient has what he describes as gait instability although slightly concerned with the orthostasis. We'll discontinue Hytrin and substitute Flomax Coronary artery disease stable Chronic diastolic heart failure stable Paroxysmal atrial fibrillation. Will continue on anticoagulation and rhythm control medications per cardiology

## 2011-03-29 ENCOUNTER — Ambulatory Visit (INDEPENDENT_AMBULATORY_CARE_PROVIDER_SITE_OTHER): Payer: Medicare PPO | Admitting: *Deleted

## 2011-03-29 DIAGNOSIS — I4891 Unspecified atrial fibrillation: Secondary | ICD-10-CM

## 2011-03-29 DIAGNOSIS — Z7901 Long term (current) use of anticoagulants: Secondary | ICD-10-CM

## 2011-05-10 ENCOUNTER — Ambulatory Visit (INDEPENDENT_AMBULATORY_CARE_PROVIDER_SITE_OTHER): Payer: Medicare PPO | Admitting: *Deleted

## 2011-05-10 DIAGNOSIS — I4891 Unspecified atrial fibrillation: Secondary | ICD-10-CM

## 2011-05-10 DIAGNOSIS — Z7901 Long term (current) use of anticoagulants: Secondary | ICD-10-CM

## 2011-05-21 ENCOUNTER — Encounter: Payer: Self-pay | Admitting: Internal Medicine

## 2011-05-21 DIAGNOSIS — I442 Atrioventricular block, complete: Secondary | ICD-10-CM

## 2011-05-31 ENCOUNTER — Encounter: Payer: Self-pay | Admitting: Internal Medicine

## 2011-05-31 ENCOUNTER — Ambulatory Visit (INDEPENDENT_AMBULATORY_CARE_PROVIDER_SITE_OTHER): Payer: Medicare PPO | Admitting: Internal Medicine

## 2011-05-31 DIAGNOSIS — I5032 Chronic diastolic (congestive) heart failure: Secondary | ICD-10-CM

## 2011-05-31 DIAGNOSIS — M1990 Unspecified osteoarthritis, unspecified site: Secondary | ICD-10-CM

## 2011-05-31 DIAGNOSIS — Z950 Presence of cardiac pacemaker: Secondary | ICD-10-CM

## 2011-05-31 DIAGNOSIS — Z7901 Long term (current) use of anticoagulants: Secondary | ICD-10-CM

## 2011-05-31 DIAGNOSIS — I2510 Atherosclerotic heart disease of native coronary artery without angina pectoris: Secondary | ICD-10-CM

## 2011-05-31 DIAGNOSIS — I10 Essential (primary) hypertension: Secondary | ICD-10-CM

## 2011-05-31 DIAGNOSIS — I4891 Unspecified atrial fibrillation: Secondary | ICD-10-CM

## 2011-05-31 DIAGNOSIS — I509 Heart failure, unspecified: Secondary | ICD-10-CM

## 2011-05-31 MED ORDER — SOTALOL HCL 80 MG PO TABS: 80.0000 mg | ORAL_TABLET | Freq: Two times a day (BID) | ORAL | Status: DC

## 2011-05-31 MED ORDER — AMLODIPINE BESYLATE 5 MG PO TABS: 5.0000 mg | ORAL_TABLET | Freq: Every day | ORAL | Status: DC

## 2011-05-31 NOTE — Patient Instructions (Signed)
Limit your sodium (Salt) intake  You need to lose weight.  Consider a lower calorie diet and regular exercise.  Return in 3 months for follow-up    It is important that you exercise regularly, at least 20 minutes 3 to 4 times per week.  If you develop chest pain or shortness of breath seek  medical attention.

## 2011-05-31 NOTE — Progress Notes (Signed)
  Subjective:    Patient ID: Francisco Merritt, male    DOB: [birth year], 78 y.o.   MRN: [id number]  HPI  78 year old patient who is in today for followup. He has a history of coronary artery disease picture for ablation status post pacemaker for tachybradycardia syndrome. He is followed biannually by cardiology and has done quite well. He has a history of chronic diastolic heart failure which has been stable. His only complaint today is some gait instability. Symptoms do not sound orthostatic. Remains on chronic Coumadin anticoagulation for his atrial for ablation denies any exertional chest pain. He remains quite active physically. He has dyslipidemia and a history of treated hypertension. Medical regimen includes amlodipine 10 mg daily as well as low-dose furosemide in the morning. He complains of urinary frequency.    Review of Systems     Objective:   Physical Exam  Constitutional: He is oriented to person, place, and time. He appears well-developed.       Blood pressure 120/68 Sitting  Blood pressure 106/70 standing   HENT:  Head: Normocephalic.  Right Ear: External ear normal.  Left Ear: External ear normal.  Eyes: Conjunctivae and EOM are normal.  Neck: Normal range of motion.  Cardiovascular: Normal rate and normal heart sounds.        Irregular rhythm  Pulmonary/Chest: Breath sounds normal.  Abdominal: Bowel sounds are normal.  Musculoskeletal: Normal range of motion. He exhibits no edema and no tenderness.  Neurological: He is alert and oriented to person, place, and time.  Psychiatric: He has a normal mood and affect. His behavior is normal.          Assessment & Plan:   Hypertension. We'll decrease amlodipine to 5 mg daily Chronic diastolic heart failure. Appears well compensated Chronic atrial fibrillation Chronic Coumadin anticoagulation Coronary artery disease stable  Low salt diet encouraged Recheck 3 months

## 2011-07-03 ENCOUNTER — Ambulatory Visit (INDEPENDENT_AMBULATORY_CARE_PROVIDER_SITE_OTHER): Payer: Medicare PPO | Admitting: Pharmacist

## 2011-07-03 DIAGNOSIS — Z7901 Long term (current) use of anticoagulants: Secondary | ICD-10-CM

## 2011-07-03 DIAGNOSIS — I4891 Unspecified atrial fibrillation: Secondary | ICD-10-CM

## 2011-07-22 ENCOUNTER — Other Ambulatory Visit: Payer: Self-pay | Admitting: Internal Medicine

## 2011-08-13 ENCOUNTER — Ambulatory Visit (INDEPENDENT_AMBULATORY_CARE_PROVIDER_SITE_OTHER): Payer: Medicare PPO | Admitting: Pharmacist

## 2011-08-13 DIAGNOSIS — I4891 Unspecified atrial fibrillation: Secondary | ICD-10-CM

## 2011-08-13 DIAGNOSIS — Z7901 Long term (current) use of anticoagulants: Secondary | ICD-10-CM

## 2011-08-13 LAB — POCT INR: INR: 2.5

## 2011-08-20 ENCOUNTER — Encounter: Payer: Self-pay | Admitting: Internal Medicine

## 2011-08-20 DIAGNOSIS — I459 Conduction disorder, unspecified: Secondary | ICD-10-CM

## 2011-08-28 ENCOUNTER — Ambulatory Visit (INDEPENDENT_AMBULATORY_CARE_PROVIDER_SITE_OTHER): Payer: Medicare PPO | Admitting: Internal Medicine

## 2011-08-28 ENCOUNTER — Encounter: Payer: Self-pay | Admitting: Internal Medicine

## 2011-08-28 VITALS — BP 120/80 | Temp 97.9°F | Wt 222.0 lb

## 2011-08-28 DIAGNOSIS — I509 Heart failure, unspecified: Secondary | ICD-10-CM

## 2011-08-28 DIAGNOSIS — I4891 Unspecified atrial fibrillation: Secondary | ICD-10-CM

## 2011-08-28 DIAGNOSIS — I2510 Atherosclerotic heart disease of native coronary artery without angina pectoris: Secondary | ICD-10-CM

## 2011-08-28 DIAGNOSIS — I10 Essential (primary) hypertension: Secondary | ICD-10-CM

## 2011-08-28 DIAGNOSIS — E785 Hyperlipidemia, unspecified: Secondary | ICD-10-CM

## 2011-08-28 DIAGNOSIS — I5032 Chronic diastolic (congestive) heart failure: Secondary | ICD-10-CM

## 2011-08-28 MED ORDER — FUROSEMIDE 20 MG PO TABS: ORAL_TABLET | ORAL | Status: DC

## 2011-08-28 MED ORDER — TRAMADOL HCL 50 MG PO TABS: 50.0000 mg | ORAL_TABLET | Freq: Three times a day (TID) | ORAL | Status: AC | PRN

## 2011-08-28 NOTE — Patient Instructions (Signed)
Limit your sodium (Salt) intake    It is important that you exercise regularly, at least 20 minutes 3 to 4 times per week.  If you develop chest pain or shortness of breath seek  medical attention.  Please check your blood pressure on a regular basis.  If it is consistently greater than 150/90, please make an office appointment.  

## 2011-08-28 NOTE — Progress Notes (Signed)
  Subjective:    Patient ID: Francisco Merritt, male    DOB: [birth year], 78 y.o.   MRN: [id number]  HPI  78 year old patient who is seen today for followup he has a history of dyslipidemia coronary artery disease and atrial for relation. He is on chronic Coumadin anticoagulation as well as rhythm control medication. He is doing quite well today his only complaint is frequent urination after using furosemide. He does have a history of chronic diastolic heart failure. He has osteoarthritis but in general does quite well. He remains quite active with gardening    Review of Systems  Constitutional: Negative for fever, chills, appetite change and fatigue.  HENT: Negative for hearing loss, ear pain, congestion, sore throat, trouble swallowing, neck stiffness, dental problem, voice change and tinnitus.   Eyes: Negative for pain, discharge and visual disturbance.  Respiratory: Negative for cough, chest tightness, wheezing and stridor.   Cardiovascular: Negative for chest pain, palpitations and leg swelling.  Gastrointestinal: Negative for nausea, vomiting, abdominal pain, diarrhea, constipation, blood in stool and abdominal distention.  Genitourinary: Positive for frequency. Negative for urgency, hematuria, flank pain, discharge, difficulty urinating and genital sores.  Musculoskeletal: Positive for back pain and arthralgias. Negative for myalgias, joint swelling and gait problem.  Skin: Negative for rash.  Neurological: Negative for dizziness, syncope, speech difficulty, weakness, numbness and headaches.  Hematological: Negative for adenopathy. Does not bruise/bleed easily.  Psychiatric/Behavioral: Negative for behavioral problems and dysphoric mood. The patient is not nervous/anxious.        Objective:   Physical Exam  Constitutional: He is oriented to person, place, and time. He appears well-developed.  HENT:  Head: Normocephalic.  Right Ear: External ear normal.  Left Ear: External ear normal.    Eyes: Conjunctivae and EOM are normal.  Neck: Normal range of motion.  Cardiovascular: Normal rate, regular rhythm and normal heart sounds.   Pulmonary/Chest: Breath sounds normal.  Abdominal: Bowel sounds are normal.  Musculoskeletal: Normal range of motion. He exhibits no edema and no tenderness.  Neurological: He is alert and oriented to person, place, and time.  Psychiatric: He has a normal mood and affect. His behavior is normal.          Assessment & Plan:   Hypertension stable Coronary artery disease stable Paroxysmal nature fibrillation. Remains in normal sinus rhythm Chronic Coumadin anticoagulation Osteoarthritis. Acetaminophen has not been effective we'll give him a trial of tramadol  Follow up cardiology Recheck in 3 months

## 2011-09-24 ENCOUNTER — Ambulatory Visit (INDEPENDENT_AMBULATORY_CARE_PROVIDER_SITE_OTHER): Payer: Medicare PPO

## 2011-09-24 DIAGNOSIS — I4891 Unspecified atrial fibrillation: Secondary | ICD-10-CM

## 2011-09-24 DIAGNOSIS — Z7901 Long term (current) use of anticoagulants: Secondary | ICD-10-CM

## 2011-09-24 LAB — POCT INR: INR: 2.4

## 2011-10-14 ENCOUNTER — Telehealth: Payer: Self-pay | Admitting: Internal Medicine

## 2011-10-14 NOTE — Telephone Encounter (Signed)
New problem:  Per [place] calling, please advise on laser procedure . Due to H/O pacer maker.  Pending 7/9.

## 2011-10-14 NOTE — Telephone Encounter (Signed)
Spoke with [place] at [facility].  Dr. Cain Saupe would like to do laser procedure cataract surgery on pt on October 22, 2011. Office is requesting surgical clearance. No medications will need to be held for procedure.  Will forward to Dr. Ladona Ridgel for review

## 2011-10-21 ENCOUNTER — Encounter: Payer: Self-pay | Admitting: *Deleted

## 2011-10-21 NOTE — Telephone Encounter (Signed)
I spoke with pt wife this morning about upcoming laser cataract & astigmatism surgery. She is aware pt does not have to stop coumadin for this procedure per Dr. Ladona Ridgel and our Coumadin clinic. Clearance letter sent Mylo Red RN

## 2011-11-05 ENCOUNTER — Ambulatory Visit (INDEPENDENT_AMBULATORY_CARE_PROVIDER_SITE_OTHER): Payer: Medicare PPO | Admitting: *Deleted

## 2011-11-05 DIAGNOSIS — Z7901 Long term (current) use of anticoagulants: Secondary | ICD-10-CM

## 2011-11-05 DIAGNOSIS — I4891 Unspecified atrial fibrillation: Secondary | ICD-10-CM

## 2011-11-05 LAB — POCT INR: INR: 3.5

## 2011-11-19 ENCOUNTER — Encounter: Payer: Self-pay | Admitting: Internal Medicine

## 2011-11-19 DIAGNOSIS — I495 Sick sinus syndrome: Secondary | ICD-10-CM

## 2011-11-20 ENCOUNTER — Ambulatory Visit (INDEPENDENT_AMBULATORY_CARE_PROVIDER_SITE_OTHER): Payer: Medicare PPO | Admitting: Internal Medicine

## 2011-11-20 ENCOUNTER — Encounter: Payer: Self-pay | Admitting: Internal Medicine

## 2011-11-20 VITALS — BP 132/86 | Temp 97.7°F | Wt 220.0 lb

## 2011-11-20 DIAGNOSIS — R0989 Other specified symptoms and signs involving the circulatory and respiratory systems: Secondary | ICD-10-CM

## 2011-11-20 DIAGNOSIS — I5032 Chronic diastolic (congestive) heart failure: Secondary | ICD-10-CM

## 2011-11-20 DIAGNOSIS — I4891 Unspecified atrial fibrillation: Secondary | ICD-10-CM

## 2011-11-20 DIAGNOSIS — I10 Essential (primary) hypertension: Secondary | ICD-10-CM

## 2011-11-20 DIAGNOSIS — I2510 Atherosclerotic heart disease of native coronary artery without angina pectoris: Secondary | ICD-10-CM

## 2011-11-20 MED ORDER — WARFARIN SODIUM 5 MG PO TABS: 5.0000 mg | ORAL_TABLET | Freq: Every day | ORAL | Status: DC

## 2011-11-20 MED ORDER — AMLODIPINE BESYLATE 5 MG PO TABS: 5.0000 mg | ORAL_TABLET | Freq: Every day | ORAL | Status: DC

## 2011-11-20 MED ORDER — FUROSEMIDE 20 MG PO TABS: ORAL_TABLET | ORAL | Status: DC

## 2011-11-20 MED ORDER — SIMVASTATIN 20 MG PO TABS: 20.0000 mg | ORAL_TABLET | Freq: Every day | ORAL | Status: DC

## 2011-11-20 MED ORDER — SOTALOL HCL 80 MG PO TABS: 80.0000 mg | ORAL_TABLET | Freq: Two times a day (BID) | ORAL | Status: DC

## 2011-11-20 MED ORDER — TAMSULOSIN HCL 0.4 MG PO CAPS: 0.4000 mg | ORAL_CAPSULE | ORAL | Status: DC

## 2011-11-20 NOTE — Progress Notes (Signed)
Subjective:    Patient ID: Francisco Merritt, male    DOB: 1929-10-10, 78 y.o.   MRN: [id number]  HPI  78 year old patient who is seen today for followup. He has H. or for ablation he remains on chronic Coumadin anticoagulation as well as Betapace. He is doing reasonably well does complain of some diminished hearing and balance issues but minor. He has had no falls. He has coronary artery disease and history of chronic diastolic heart failure. Denies any chest pain. He has treated hypertension which has been stable. He has osteoarthritis. He remains on simvastatin for dyslipidemia   Past Medical History  Diagnosis Date  . Paroxysmal atrial fibrillation     s/p Guidant Insignia pacemaker; started sotalol 06/2009  . Tachy-brady syndrome   . Coronary artery disease     one vessel CAD with normal EF; Cath 12/2006. Ef normal. LM nl, LAD 40%, D2 occulded with collaterals, LCX 40%, RCA 30%; Echo 02/2009 EF 50-55% grade I diastolic dysfunction.  Marland Kitchen History of gallstones     status post cholecystectomy  . Hypertension   . Hyperlipidemia   . History of peptic ulcer disease   . Osteoarthritis   . Benign prostatic hypertrophy   . Obesity   . History of nephrolithiasis   . Diverticulosis of colon   . Testosterone deficiency   . ED (erectile dysfunction)     History   Social History  . Marital Status: Married    Spouse Name: N/A    Number of Children: N/A  . Years of Education: N/A   Occupational History  . Not on file.   Social History Main Topics  . Smoking status: Never Smoker   . Smokeless tobacco: Never Used  . Alcohol Use: No  . Drug Use: No  . Sexually Active: Not on file   Other Topics Concern  . Not on file   Social History Narrative  . No narrative on file    Past Surgical History  Procedure Date  . Cholecystectomy   . Insert / replace / remove pacemaker   . Cardiac catheterization   . Colonoscopy 1999  . Upper gastroduodenoscopy 1999  . Doppler evaluation      Family History  Problem Relation Age of Onset  . Hypertension Mother   . Heart disease Mother   . Diabetes Mother   . Heart attack Father   . Diabetes Brother   . Hypertension Brother   . Heart disease Brother   . Diabetes Brother   . Hypertension Brother   . Heart disease Brother     Allergies  Allergen Reactions  . Sulfonamide Derivatives     REACTION: n/v    Current Outpatient Prescriptions on File Prior to Visit  Medication Sig Dispense Refill  . acetaminophen (TYLENOL ARTHRITIS PAIN) 650 MG CR tablet Take 650 mg by mouth as needed.       Marland Kitchen amLODipine (NORVASC) 5 MG tablet Take 1 tablet (5 mg total) by mouth daily.  90 tablet  3  . Ascorbic Acid (VITAMIN C) 500 MG tablet Take 500 mg by mouth daily.        Marland Kitchen aspirin 81 MG tablet Take 81 mg by mouth daily.        . furosemide (LASIX) 20 MG tablet If needed for swelling  90 tablet  6  . glucosamine-chondroitin 500-400 MG tablet Take 1 tablet by mouth 2 (two) times daily.        . Multiple Vitamin (MULTIVITAMIN) tablet Take 1 tablet  by mouth daily.        . nitroGLYCERIN (NITROSTAT) 0.4 MG SL tablet Place 1 tablet (0.4 mg total) under the tongue every 5 (five) minutes as needed.  90 tablet  6  . simvastatin (ZOCOR) 20 MG tablet TAKE ONE TABLET BY MOUTH ONE TIME DAILY  90 tablet  5  . sotalol (BETAPACE) 80 MG tablet Take 1 tablet (80 mg total) by mouth 2 (two) times daily.  120 tablet  5  . Tamsulosin HCl (FLOMAX) 0.4 MG CAPS Take 1 capsule (0.4 mg total) by mouth daily after supper.  90 capsule  6  . warfarin (COUMADIN) 5 MG tablet TAKE 1 TO 1& 1/2 TABLETS DAILY AS DIRECTED  180 tablet  2    BP 132/86  Temp 97.7 F (36.5 C) (Oral)  Wt 220 lb (99.791 kg)        Review of Systems  Constitutional: Negative for fever, chills, appetite change and fatigue.  HENT: Positive for hearing loss. Negative for ear pain, congestion, sore throat, trouble swallowing, neck stiffness, dental problem, voice change and tinnitus.    Eyes: Negative for pain, discharge and visual disturbance.  Respiratory: Negative for cough, chest tightness, wheezing and stridor.   Cardiovascular: Negative for chest pain, palpitations and leg swelling.  Gastrointestinal: Negative for nausea, vomiting, abdominal pain, diarrhea, constipation, blood in stool and abdominal distention.  Genitourinary: Negative for urgency, hematuria, flank pain, discharge, difficulty urinating and genital sores.  Musculoskeletal: Negative for myalgias, back pain, joint swelling, arthralgias and gait problem.  Skin: Negative for rash.  Neurological: Positive for dizziness and light-headedness. Negative for syncope, speech difficulty, weakness, numbness and headaches.  Hematological: Negative for adenopathy. Does not bruise/bleed easily.  Psychiatric/Behavioral: Negative for behavioral problems and dysphoric mood. The patient is not nervous/anxious.        Objective:   Physical Exam  Constitutional: He is oriented to person, place, and time. He appears well-developed.       Blood pressure 130/80  HENT:  Head: Normocephalic.  Right Ear: External ear normal.  Left Ear: External ear normal.  Eyes: Conjunctivae and EOM are normal.  Neck: Normal range of motion.       Faint bruit  Cardiovascular: Normal rate, regular rhythm and normal heart sounds.   Pulmonary/Chest: Breath sounds normal.  Abdominal: Bowel sounds are normal.  Musculoskeletal: Normal range of motion. He exhibits no edema and no tenderness.  Neurological: He is alert and oriented to person, place, and time.  Psychiatric: He has a normal mood and affect. His behavior is normal.          Assessment & Plan:   Atrial  fibrillation. Stable Coronary artery disease asymptomatic Hypertension controlled Dyslipidemia. Continue simvastatin 20  All medications refilled Recheck 3 months

## 2011-11-20 NOTE — Patient Instructions (Signed)
Limit your sodium (Salt) intake    It is important that you exercise regularly, at least 20 minutes 3 to 4 times per week.  If you develop chest pain or shortness of breath seek  medical attention.  Return in 3 months for follow-up  

## 2011-11-25 ENCOUNTER — Ambulatory Visit (INDEPENDENT_AMBULATORY_CARE_PROVIDER_SITE_OTHER): Payer: Medicare PPO

## 2011-11-25 DIAGNOSIS — I4891 Unspecified atrial fibrillation: Secondary | ICD-10-CM

## 2011-11-25 DIAGNOSIS — Z7901 Long term (current) use of anticoagulants: Secondary | ICD-10-CM

## 2011-11-25 LAB — POCT INR: INR: 2.1

## 2011-11-28 ENCOUNTER — Ambulatory Visit: Payer: Medicare PPO | Admitting: Internal Medicine

## 2011-12-23 ENCOUNTER — Ambulatory Visit (INDEPENDENT_AMBULATORY_CARE_PROVIDER_SITE_OTHER): Payer: Medicare PPO

## 2011-12-23 DIAGNOSIS — Z7901 Long term (current) use of anticoagulants: Secondary | ICD-10-CM

## 2011-12-23 DIAGNOSIS — I4891 Unspecified atrial fibrillation: Secondary | ICD-10-CM

## 2011-12-23 LAB — POCT INR: INR: 3.3

## 2012-01-20 ENCOUNTER — Ambulatory Visit (INDEPENDENT_AMBULATORY_CARE_PROVIDER_SITE_OTHER): Payer: Medicare PPO | Admitting: *Deleted

## 2012-01-20 DIAGNOSIS — Z7901 Long term (current) use of anticoagulants: Secondary | ICD-10-CM

## 2012-01-20 DIAGNOSIS — I4891 Unspecified atrial fibrillation: Secondary | ICD-10-CM

## 2012-01-20 LAB — POCT INR: INR: 2.6

## 2012-01-21 ENCOUNTER — Encounter: Payer: Self-pay | Admitting: *Deleted

## 2012-01-28 ENCOUNTER — Encounter: Payer: Self-pay | Admitting: Internal Medicine

## 2012-01-28 ENCOUNTER — Ambulatory Visit (INDEPENDENT_AMBULATORY_CARE_PROVIDER_SITE_OTHER): Payer: Medicare PPO | Admitting: Internal Medicine

## 2012-01-28 VITALS — BP 124/76 | HR 68 | Ht 71.0 in | Wt 222.2 lb

## 2012-01-28 DIAGNOSIS — I4891 Unspecified atrial fibrillation: Secondary | ICD-10-CM

## 2012-01-28 DIAGNOSIS — I10 Essential (primary) hypertension: Secondary | ICD-10-CM

## 2012-01-28 DIAGNOSIS — Z7901 Long term (current) use of anticoagulants: Secondary | ICD-10-CM

## 2012-01-28 LAB — PACEMAKER DEVICE OBSERVATION
BMOD-0002RV: 6
BMOD-0003RV: 30
BMOD-0004RV: 2
BRDY-0002RV: 60 {beats}/min
BRDY-0003RV: 120 {beats}/min
BRDY-0004RV: 130 {beats}/min
DEVICE MODEL PM: 314486
RV LEAD AMPLITUDE: 12 mv
RV LEAD IMPEDENCE PM: 610 Ohm
RV LEAD THRESHOLD: 0.7 v
VENTRICULAR PACING PM: 33

## 2012-01-28 MED ORDER — METOPROLOL TARTRATE 25 MG PO TABS: 25.0000 mg | ORAL_TABLET | Freq: Two times a day (BID) | ORAL | Status: DC

## 2012-01-28 NOTE — Patient Instructions (Addendum)
Your physician has recommended you make the following change in your medication:  Stop Betapace Start Metoprolol 25 mg twice daily.   Your physician recommends that you schedule a follow-up appointment in: with device clinic in 6 months and 12 months with Dr. Ladona Ridgel.

## 2012-01-28 NOTE — Progress Notes (Signed)
HPI Francisco Merritt returns today for followup. He is a very pleasant 78 year old man with a history of chronic atrial fibrillation, hypertension, symptomatic bradycardia, status post permanent pacemaker insertion. The patient has done well in the interim. He denies chest pain or shortness of breath. He has minimal peripheral edema. No syncope. Allergies  Allergen Reactions  . Sulfonamide Derivatives     REACTION: n/v     Current Outpatient Prescriptions  Medication Sig Dispense Refill  . acetaminophen (TYLENOL ARTHRITIS PAIN) 650 MG CR tablet Take 650 mg by mouth as needed.       Marland Kitchen amLODipine (NORVASC) 5 MG tablet Take 1 tablet (5 mg total) by mouth daily.  90 tablet  3  . Ascorbic Acid (VITAMIN C) 500 MG tablet Take 500 mg by mouth daily.        Marland Kitchen aspirin 81 MG tablet Take 81 mg by mouth daily.        Marland Kitchen glucosamine-chondroitin 500-400 MG tablet Take 1 tablet by mouth 2 (two) times daily.        . metoprolol tartrate (LOPRESSOR) 25 MG tablet Take 1 tablet (25 mg total) by mouth 2 (two) times daily.  60 tablet  6  . Multiple Vitamin (MULTIVITAMIN) tablet Take 1 tablet by mouth daily.        . nitroGLYCERIN (NITROSTAT) 0.4 MG SL tablet Place 1 tablet (0.4 mg total) under the tongue every 5 (five) minutes as needed.  90 tablet  6  . simvastatin (ZOCOR) 20 MG tablet Take 1 tablet (20 mg total) by mouth at bedtime.  90 tablet  5  . Tamsulosin HCl (FLOMAX) 0.4 MG CAPS Take 1 capsule (0.4 mg total) by mouth daily after supper.  90 capsule  6  . warfarin (COUMADIN) 5 MG tablet Take 1 tablet (5 mg total) by mouth daily.  180 tablet  2  . DISCONTD: metoprolol tartrate (LOPRESSOR) 25 MG tablet Take 25 mg by mouth 2 (two) times daily.         Past Medical History  Diagnosis Date  . Paroxysmal atrial fibrillation     s/p Guidant Insignia pacemaker; started sotalol 06/2009  . Tachy-brady syndrome   . Coronary artery disease     one vessel CAD with normal EF; Cath 12/2006. Ef normal. LM nl, LAD 40%, D2  occulded with collaterals, LCX 40%, RCA 30%; Echo 02/2009 EF 50-55% grade I diastolic dysfunction.  Marland Kitchen History of gallstones     status post cholecystectomy  . Hypertension   . Hyperlipidemia   . History of peptic ulcer disease   . Osteoarthritis   . Benign prostatic hypertrophy   . Obesity   . History of nephrolithiasis   . Diverticulosis of colon   . Testosterone deficiency   . ED (erectile dysfunction)     ROS:   All systems reviewed and negative except as noted in the HPI.   Past Surgical History  Procedure Date  . Cholecystectomy   . Insert / replace / remove pacemaker   . Cardiac catheterization   . Colonoscopy 1999  . Upper gastroduodenoscopy 1999  . Doppler evaluation      Family History  Problem Relation Age of Onset  . Hypertension Mother   . Heart disease Mother   . Diabetes Mother   . Heart attack Father   . Diabetes Brother   . Hypertension Brother   . Heart disease Brother   . Diabetes Brother   . Hypertension Brother   . Heart disease Brother  History   Social History  . Marital Status: Married    Spouse Name: N/A    Number of Children: N/A  . Years of Education: N/A   Occupational History  . Not on file.   Social History Main Topics  . Smoking status: Never Smoker   . Smokeless tobacco: Never Used  . Alcohol Use: No  . Drug Use: No  . Sexually Active: Not on file   Other Topics Concern  . Not on file   Social History Narrative  . No narrative on file     BP 124/76  Pulse 68  Ht 5\' 11"  (1.803 m)  Wt 222 lb 3.2 oz (100.789 kg)  BMI 30.99 kg/m2  SpO2 97%  Physical Exam:  Well appearing elderly man, NAD HEENT: Unremarkable Neck:  No JVD, no thyromegally Lungs:  Clear with no wheezes, rales, or rhonchi. HEART:  Regular rate rhythm, no murmurs, no rubs, no clicks Abd:  soft, positive bowel sounds, no organomegally, no rebound, no guarding Ext:  2 plus pulses, no edema, no cyanosis, no clubbing Skin:  No rashes no  nodules Neuro:  CN II through XII intact, motor grossly intact  DEVICE  Normal device function.  See PaceArt for details.   Assess/Plan:

## 2012-01-28 NOTE — Assessment & Plan Note (Signed)
He is currently tolerating Coumadin. No plans to change anticoagulation at this time.

## 2012-01-28 NOTE — Assessment & Plan Note (Signed)
He is now chronically in atrial fibrillation despite sotalol. I've recommended that the patient stop sotalol and switch to metoprolol for rate control.

## 2012-01-28 NOTE — Assessment & Plan Note (Signed)
His blood pressure is well controlled. We'll plan to stop sotalol today and switch him to metoprolol.

## 2012-02-11 ENCOUNTER — Encounter: Payer: Self-pay | Admitting: Internal Medicine

## 2012-02-17 ENCOUNTER — Ambulatory Visit (INDEPENDENT_AMBULATORY_CARE_PROVIDER_SITE_OTHER): Payer: Medicare PPO | Admitting: *Deleted

## 2012-02-17 DIAGNOSIS — Z7901 Long term (current) use of anticoagulants: Secondary | ICD-10-CM

## 2012-02-17 DIAGNOSIS — I4891 Unspecified atrial fibrillation: Secondary | ICD-10-CM

## 2012-02-17 LAB — POCT INR: INR: 2.4

## 2012-02-18 DIAGNOSIS — I495 Sick sinus syndrome: Secondary | ICD-10-CM

## 2012-02-24 ENCOUNTER — Ambulatory Visit (INDEPENDENT_AMBULATORY_CARE_PROVIDER_SITE_OTHER): Payer: Medicare PPO | Admitting: Internal Medicine

## 2012-02-24 ENCOUNTER — Encounter: Payer: Self-pay | Admitting: Internal Medicine

## 2012-02-24 VITALS — BP 132/82 | HR 77 | Temp 97.5°F | Resp 16 | Wt 222.0 lb

## 2012-02-24 DIAGNOSIS — Z7901 Long term (current) use of anticoagulants: Secondary | ICD-10-CM

## 2012-02-24 DIAGNOSIS — Z23 Encounter for immunization: Secondary | ICD-10-CM

## 2012-02-24 DIAGNOSIS — I10 Essential (primary) hypertension: Secondary | ICD-10-CM

## 2012-02-24 DIAGNOSIS — M1990 Unspecified osteoarthritis, unspecified site: Secondary | ICD-10-CM

## 2012-02-24 DIAGNOSIS — I4891 Unspecified atrial fibrillation: Secondary | ICD-10-CM

## 2012-02-24 LAB — COMPREHENSIVE METABOLIC PANEL
AST: 26 U/L (ref 0–37)
BUN: 17 mg/dL (ref 6–23)
CO2: 28 mEq/L (ref 19–32)
Calcium: 9.8 mg/dL (ref 8.4–10.5)
Chloride: 105 mEq/L (ref 96–112)
Creatinine, Ser: 1.2 mg/dL (ref 0.4–1.5)
GFR: 60.18 mL/min (ref >60.00)
Total Bilirubin: 0.6 mg/dL (ref 0.3–1.2)

## 2012-02-24 LAB — CBC WITH DIFFERENTIAL/PLATELET
Basophils Relative: 0.4 % (ref 0.0–3.0)
HCT: 42.3 % (ref 39.0–52.0)
Hemoglobin: 14 g/dL (ref 13.0–17.0)
Lymphocytes Relative: 18.6 % (ref 12.0–46.0)
Lymphs Abs: 1.7 10*3/uL (ref 0.7–4.0)
Monocytes Relative: 13.2 % — ABNORMAL HIGH (ref 3.0–12.0)
Neutro Abs: 6 10*3/uL (ref 1.4–7.7)
RBC: 4.25 Mil/uL (ref 4.22–5.81)

## 2012-02-24 LAB — TSH: TSH: 0.99 u[IU]/mL (ref 0.35–5.50)

## 2012-02-24 NOTE — Progress Notes (Signed)
Subjective:    Patient ID: Francisco Merritt, male    DOB: 02-26-1930, 78 y.o.   MRN: [id number]  HPI  78 year old patient who is followed by cardiology due 2 coronary artery disease atrial fibrillation and chronic diastolic heart failure. He is doing quite well. He has treated hypertension dyslipidemia osteoarthritis. In quite well today without cardiopulmonary complaints. He remains on chronic Coumadin anticoagulation  Past Medical History  Diagnosis Date  . Paroxysmal atrial fibrillation     s/p Guidant Insignia pacemaker; started sotalol 06/2009  . Tachy-brady syndrome   . Coronary artery disease     one vessel CAD with normal EF; Cath 12/2006. Ef normal. LM nl, LAD 40%, D2 occulded with collaterals, LCX 40%, RCA 30%; Echo 02/2009 EF 50-55% grade I diastolic dysfunction.  Marland Kitchen History of gallstones     status post cholecystectomy  . Hypertension   . Hyperlipidemia   . History of peptic ulcer disease   . Osteoarthritis   . Benign prostatic hypertrophy   . Obesity   . History of nephrolithiasis   . Diverticulosis of colon   . Testosterone deficiency   . ED (erectile dysfunction)     History   Social History  . Marital Status: Married    Spouse Name: N/A    Number of Children: N/A  . Years of Education: N/A   Occupational History  . Not on file.   Social History Main Topics  . Smoking status: Never Smoker   . Smokeless tobacco: Never Used  . Alcohol Use: No  . Drug Use: No  . Sexually Active: Not on file   Other Topics Concern  . Not on file   Social History Narrative  . No narrative on file    Past Surgical History  Procedure Date  . Cholecystectomy   . Insert / replace / remove pacemaker   . Cardiac catheterization   . Colonoscopy 1999  . Upper gastroduodenoscopy 1999  . Doppler evaluation     Family History  Problem Relation Age of Onset  . Hypertension Mother   . Heart disease Mother   . Diabetes Mother   . Heart attack Father   . Diabetes Brother     . Hypertension Brother   . Heart disease Brother   . Diabetes Brother   . Hypertension Brother   . Heart disease Brother     Allergies  Allergen Reactions  . Sulfonamide Derivatives     REACTION: n/v    Current Outpatient Prescriptions on File Prior to Visit  Medication Sig Dispense Refill  . acetaminophen (TYLENOL ARTHRITIS PAIN) 650 MG CR tablet Take 650 mg by mouth as needed.       Marland Kitchen amLODipine (NORVASC) 5 MG tablet Take 1 tablet (5 mg total) by mouth daily.  90 tablet  3  . Ascorbic Acid (VITAMIN C) 500 MG tablet Take 500 mg by mouth daily.        Marland Kitchen aspirin 81 MG tablet Take 81 mg by mouth daily.        Marland Kitchen glucosamine-chondroitin 500-400 MG tablet Take 1 tablet by mouth 2 (two) times daily.        . metoprolol tartrate (LOPRESSOR) 25 MG tablet Take 1 tablet (25 mg total) by mouth 2 (two) times daily.  60 tablet  6  . Multiple Vitamin (MULTIVITAMIN) tablet Take 1 tablet by mouth daily.        . nitroGLYCERIN (NITROSTAT) 0.4 MG SL tablet Place 1 tablet (0.4 mg total) under the tongue  every 5 (five) minutes as needed.  90 tablet  6  . simvastatin (ZOCOR) 20 MG tablet Take 1 tablet (20 mg total) by mouth at bedtime.  90 tablet  5  . Tamsulosin HCl (FLOMAX) 0.4 MG CAPS Take 1 capsule (0.4 mg total) by mouth daily after supper.  90 capsule  6  . warfarin (COUMADIN) 5 MG tablet Take 1 tablet (5 mg total) by mouth daily.  180 tablet  2    BP 132/82  Pulse 77  Temp 97.5 F (36.4 C) (Oral)  Resp 16  Wt 222 lb (100.699 kg)      Review of Systems  Constitutional: Negative for fever, chills, appetite change and fatigue.  HENT: Negative for hearing loss, ear pain, congestion, sore throat, trouble swallowing, neck stiffness, dental problem, voice change and tinnitus.   Eyes: Negative for pain, discharge and visual disturbance.  Respiratory: Negative for cough, chest tightness, wheezing and stridor.   Cardiovascular: Negative for chest pain, palpitations and leg swelling.   Gastrointestinal: Negative for nausea, vomiting, abdominal pain, diarrhea, constipation, blood in stool and abdominal distention.  Genitourinary: Negative for urgency, hematuria, flank pain, discharge, difficulty urinating and genital sores.  Musculoskeletal: Positive for back pain and arthralgias. Negative for myalgias, joint swelling and gait problem.  Skin: Negative for rash.  Neurological: Negative for dizziness, syncope, speech difficulty, weakness, numbness and headaches.  Hematological: Negative for adenopathy. Does not bruise/bleed easily.  Psychiatric/Behavioral: Negative for behavioral problems and dysphoric mood. The patient is not nervous/anxious.        Objective:   Physical Exam  Constitutional: He is oriented to person, place, and time. He appears well-developed.  HENT:  Head: Normocephalic.  Right Ear: External ear normal.  Left Ear: External ear normal.       Some wax in the right canal but no foreign body  Eyes: Conjunctivae normal and EOM are normal.  Neck: Normal range of motion.  Cardiovascular: Normal rate and normal heart sounds.   Pulmonary/Chest: Breath sounds normal.  Abdominal: Bowel sounds are normal.  Musculoskeletal: Normal range of motion. He exhibits no edema and no tenderness.  Neurological: He is alert and oriented to person, place, and time.  Psychiatric: He has a normal mood and affect. His behavior is normal.          Assessment & Plan:   Chronic atrial fibrillation Hypertension stable Dyslipidemia  Medical regimen unchanged Recheck 6 months or as needed

## 2012-02-24 NOTE — Patient Instructions (Signed)
Limit your sodium (Salt) intake    It is important that you exercise regularly, at least 20 minutes 3 to 4 times per week.  If you develop chest pain or shortness of breath seek  medical attention.  Return in 6 months for follow-up  

## 2012-03-16 ENCOUNTER — Ambulatory Visit (INDEPENDENT_AMBULATORY_CARE_PROVIDER_SITE_OTHER): Payer: Medicare PPO | Admitting: *Deleted

## 2012-03-16 DIAGNOSIS — I4891 Unspecified atrial fibrillation: Secondary | ICD-10-CM

## 2012-03-16 DIAGNOSIS — Z7901 Long term (current) use of anticoagulants: Secondary | ICD-10-CM

## 2012-04-27 ENCOUNTER — Ambulatory Visit (INDEPENDENT_AMBULATORY_CARE_PROVIDER_SITE_OTHER): Payer: Medicare Other

## 2012-04-27 DIAGNOSIS — Z7901 Long term (current) use of anticoagulants: Secondary | ICD-10-CM

## 2012-04-27 DIAGNOSIS — I4891 Unspecified atrial fibrillation: Secondary | ICD-10-CM

## 2012-04-27 LAB — POCT INR: INR: 2.8

## 2012-05-19 DIAGNOSIS — I495 Sick sinus syndrome: Secondary | ICD-10-CM

## 2012-06-08 ENCOUNTER — Ambulatory Visit (INDEPENDENT_AMBULATORY_CARE_PROVIDER_SITE_OTHER): Payer: Medicare Other

## 2012-06-08 DIAGNOSIS — Z7901 Long term (current) use of anticoagulants: Secondary | ICD-10-CM

## 2012-06-08 DIAGNOSIS — I4891 Unspecified atrial fibrillation: Secondary | ICD-10-CM

## 2012-07-21 ENCOUNTER — Ambulatory Visit (INDEPENDENT_AMBULATORY_CARE_PROVIDER_SITE_OTHER): Payer: Medicare Other | Admitting: *Deleted

## 2012-07-21 DIAGNOSIS — I4891 Unspecified atrial fibrillation: Secondary | ICD-10-CM

## 2012-07-21 DIAGNOSIS — Z7901 Long term (current) use of anticoagulants: Secondary | ICD-10-CM

## 2012-08-18 ENCOUNTER — Ambulatory Visit (INDEPENDENT_AMBULATORY_CARE_PROVIDER_SITE_OTHER): Payer: Medicare Other | Admitting: *Deleted

## 2012-08-18 DIAGNOSIS — I495 Sick sinus syndrome: Secondary | ICD-10-CM

## 2012-08-18 DIAGNOSIS — Z7901 Long term (current) use of anticoagulants: Secondary | ICD-10-CM

## 2012-08-18 DIAGNOSIS — I4891 Unspecified atrial fibrillation: Secondary | ICD-10-CM

## 2012-08-18 LAB — POCT INR: INR: 3.5

## 2012-08-24 ENCOUNTER — Ambulatory Visit (INDEPENDENT_AMBULATORY_CARE_PROVIDER_SITE_OTHER): Payer: Medicare Other | Admitting: Internal Medicine

## 2012-08-24 ENCOUNTER — Encounter: Payer: Self-pay | Admitting: Internal Medicine

## 2012-08-24 VITALS — BP 142/80 | HR 74 | Temp 98.0°F | Resp 20 | Wt 218.0 lb

## 2012-08-24 DIAGNOSIS — I5032 Chronic diastolic (congestive) heart failure: Secondary | ICD-10-CM

## 2012-08-24 DIAGNOSIS — I2510 Atherosclerotic heart disease of native coronary artery without angina pectoris: Secondary | ICD-10-CM

## 2012-08-24 DIAGNOSIS — Z950 Presence of cardiac pacemaker: Secondary | ICD-10-CM

## 2012-08-24 DIAGNOSIS — E785 Hyperlipidemia, unspecified: Secondary | ICD-10-CM

## 2012-08-24 DIAGNOSIS — I4891 Unspecified atrial fibrillation: Secondary | ICD-10-CM

## 2012-08-24 DIAGNOSIS — I10 Essential (primary) hypertension: Secondary | ICD-10-CM

## 2012-08-24 MED ORDER — TRAMADOL HCL 50 MG PO TABS: 50.0000 mg | ORAL_TABLET | Freq: Four times a day (QID) | ORAL | Status: DC | PRN

## 2012-08-24 NOTE — Patient Instructions (Signed)
Limit your sodium (Salt) intake    It is important that you exercise regularly, at least 20 minutes 3 to 4 times per week.  If you develop chest pain or shortness of breath seek  medical attention.  You need to lose weight.  Consider a lower calorie diet and regular exercise.    It is important that you exercise regularly, at least 20 minutes 3 to 4 times per week.  If you develop chest pain or shortness of breath seek  medical attention.  Return in 6 months for follow-up

## 2012-08-24 NOTE — Progress Notes (Signed)
Subjective:    Patient ID: Francisco Merritt, male    DOB: April 21, 1929, 79 y.o.   MRN: [id number]  HPI  79 year old patient who is seen today for his biannual followup. He has history of coronary artery disease status post permanent pacemaker insertion. He has dyslipidemia treated hypertension. He has atrial fibrillation and remains on chronic Coumadin anticoagulation. He has been fairly stable but continues to have the fatigue. He does remain very active with gardening. He does have a history of testosterone deficiency but did not receive any symptomatic improvement with replacement therapy. In general remains fairly stable.  Past Medical History  Diagnosis Date  . Paroxysmal atrial fibrillation     s/p Guidant Insignia pacemaker; started sotalol 06/2009  . Tachy-brady syndrome   . Coronary artery disease     one vessel CAD with normal EF; Cath 12/2006. Ef normal. LM nl, LAD 40%, D2 occulded with collaterals, LCX 40%, RCA 30%; Echo 02/2009 EF 50-55% grade I diastolic dysfunction.  Marland Kitchen History of gallstones     status post cholecystectomy  . Hypertension   . Hyperlipidemia   . History of peptic ulcer disease   . Osteoarthritis   . Benign prostatic hypertrophy   . Obesity   . History of nephrolithiasis   . Diverticulosis of colon   . Testosterone deficiency   . ED (erectile dysfunction)     History   Social History  . Marital Status: Married    Spouse Name: N/A    Number of Children: N/A  . Years of Education: N/A   Occupational History  . Not on file.   Social History Main Topics  . Smoking status: Never Smoker   . Smokeless tobacco: Never Used  . Alcohol Use: No  . Drug Use: No  . Sexually Active: Not on file   Other Topics Concern  . Not on file   Social History Narrative  . No narrative on file    Past Surgical History  Procedure Laterality Date  . Cholecystectomy    . Insert / replace / remove pacemaker    . Cardiac catheterization    . Colonoscopy  1999  .  Upper gastroduodenoscopy  1999  . Doppler evaluation      Family History  Problem Relation Age of Onset  . Hypertension Mother   . Heart disease Mother   . Diabetes Mother   . Heart attack Father   . Diabetes Brother   . Hypertension Brother   . Heart disease Brother   . Diabetes Brother   . Hypertension Brother   . Heart disease Brother     Allergies  Allergen Reactions  . Sulfonamide Derivatives     REACTION: n/v    Current Outpatient Prescriptions on File Prior to Visit  Medication Sig Dispense Refill  . acetaminophen (TYLENOL ARTHRITIS PAIN) 650 MG CR tablet Take 650 mg by mouth as needed.       Marland Kitchen amLODipine (NORVASC) 5 MG tablet Take 1 tablet (5 mg total) by mouth daily.  90 tablet  3  . Ascorbic Acid (VITAMIN C) 500 MG tablet Take 500 mg by mouth daily.        Marland Kitchen aspirin 81 MG tablet Take 81 mg by mouth daily.        Marland Kitchen glucosamine-chondroitin 500-400 MG tablet Take 1 tablet by mouth 2 (two) times daily.        . metoprolol tartrate (LOPRESSOR) 25 MG tablet Take 1 tablet (25 mg total) by mouth 2 (two) times  daily.  60 tablet  6  . Multiple Vitamin (MULTIVITAMIN) tablet Take 1 tablet by mouth daily.        . nitroGLYCERIN (NITROSTAT) 0.4 MG SL tablet Place 1 tablet (0.4 mg total) under the tongue every 5 (five) minutes as needed.  90 tablet  6  . simvastatin (ZOCOR) 20 MG tablet Take 1 tablet (20 mg total) by mouth at bedtime.  90 tablet  5  . Tamsulosin HCl (FLOMAX) 0.4 MG CAPS Take 1 capsule (0.4 mg total) by mouth daily after supper.  90 capsule  6  . warfarin (COUMADIN) 5 MG tablet Take 1 tablet (5 mg total) by mouth daily.  180 tablet  2   No current facility-administered medications on file prior to visit.    BP 142/80  Pulse 74  Temp(Src) 98 F (36.7 C) (Oral)  Resp 20  Wt 218 lb (98.884 kg)  BMI 30.42 kg/m2  SpO2 96%       Review of Systems  Constitutional: Positive for fatigue. Negative for fever, chills and appetite change.  HENT: Negative for  hearing loss, ear pain, congestion, sore throat, trouble swallowing, neck stiffness, dental problem, voice change and tinnitus.   Eyes: Negative for pain, discharge and visual disturbance.  Respiratory: Negative for cough, chest tightness, wheezing and stridor.   Cardiovascular: Negative for chest pain, palpitations and leg swelling.  Gastrointestinal: Negative for nausea, vomiting, abdominal pain, diarrhea, constipation, blood in stool and abdominal distention.  Genitourinary: Positive for frequency. Negative for urgency, hematuria, flank pain, discharge, difficulty urinating and genital sores.  Musculoskeletal: Negative for myalgias, back pain, joint swelling, arthralgias and gait problem.  Skin: Negative for rash.  Neurological: Positive for weakness. Negative for dizziness, syncope, speech difficulty, numbness and headaches.  Hematological: Negative for adenopathy. Does not bruise/bleed easily.  Psychiatric/Behavioral: Negative for behavioral problems and dysphoric mood. The patient is not nervous/anxious.        Objective:   Physical Exam  Constitutional: He is oriented to person, place, and time. He appears well-developed.  HENT:  Head: Normocephalic.  Right Ear: External ear normal.  Left Ear: External ear normal.  Eyes: Conjunctivae and EOM are normal.  Neck: Normal range of motion.  Cardiovascular: Normal rate and normal heart sounds.   Rhythm is very regular-  may be in sinus rhythm  Pulmonary/Chest: He has rales.  Rare basilar crackles  Abdominal: Bowel sounds are normal.  Musculoskeletal: Normal range of motion. He exhibits no edema and no tenderness.  Neurological: He is alert and oriented to person, place, and time.  Psychiatric: He has a normal mood and affect. His behavior is normal.          Assessment & Plan:    AF HTN HLD  Meds RF  No Change Low salt diet Exercise Weight loss  CPX 6 months

## 2012-09-08 ENCOUNTER — Ambulatory Visit (INDEPENDENT_AMBULATORY_CARE_PROVIDER_SITE_OTHER): Payer: Medicare Other | Admitting: Pharmacist

## 2012-09-08 DIAGNOSIS — I4891 Unspecified atrial fibrillation: Secondary | ICD-10-CM

## 2012-09-08 DIAGNOSIS — Z7901 Long term (current) use of anticoagulants: Secondary | ICD-10-CM

## 2012-10-06 ENCOUNTER — Ambulatory Visit (INDEPENDENT_AMBULATORY_CARE_PROVIDER_SITE_OTHER): Payer: Medicare Other | Admitting: *Deleted

## 2012-10-06 DIAGNOSIS — I4891 Unspecified atrial fibrillation: Secondary | ICD-10-CM

## 2012-10-06 DIAGNOSIS — Z7901 Long term (current) use of anticoagulants: Secondary | ICD-10-CM

## 2012-10-11 ENCOUNTER — Other Ambulatory Visit: Payer: Self-pay | Admitting: Internal Medicine

## 2012-11-03 ENCOUNTER — Ambulatory Visit (INDEPENDENT_AMBULATORY_CARE_PROVIDER_SITE_OTHER): Payer: Medicare Other | Admitting: Pharmacist

## 2012-11-03 DIAGNOSIS — Z7901 Long term (current) use of anticoagulants: Secondary | ICD-10-CM

## 2012-11-03 DIAGNOSIS — I4891 Unspecified atrial fibrillation: Secondary | ICD-10-CM

## 2012-11-03 LAB — POCT INR: INR: 2.3

## 2012-11-11 ENCOUNTER — Other Ambulatory Visit: Payer: Self-pay | Admitting: Internal Medicine

## 2012-11-17 ENCOUNTER — Encounter: Payer: Self-pay | Admitting: Internal Medicine

## 2012-11-17 DIAGNOSIS — I495 Sick sinus syndrome: Secondary | ICD-10-CM

## 2012-12-02 ENCOUNTER — Other Ambulatory Visit: Payer: Self-pay | Admitting: Internal Medicine

## 2012-12-17 ENCOUNTER — Ambulatory Visit (INDEPENDENT_AMBULATORY_CARE_PROVIDER_SITE_OTHER): Payer: Medicare Other | Admitting: General Practice

## 2012-12-17 DIAGNOSIS — Z7901 Long term (current) use of anticoagulants: Secondary | ICD-10-CM

## 2012-12-17 DIAGNOSIS — I4891 Unspecified atrial fibrillation: Secondary | ICD-10-CM

## 2012-12-17 LAB — POCT INR: INR: 2.3

## 2012-12-30 ENCOUNTER — Other Ambulatory Visit: Payer: Self-pay | Admitting: Internal Medicine

## 2013-01-28 ENCOUNTER — Ambulatory Visit (INDEPENDENT_AMBULATORY_CARE_PROVIDER_SITE_OTHER): Payer: Medicare Other | Admitting: General Practice

## 2013-01-28 ENCOUNTER — Encounter: Payer: Self-pay | Admitting: Internal Medicine

## 2013-01-28 ENCOUNTER — Ambulatory Visit (INDEPENDENT_AMBULATORY_CARE_PROVIDER_SITE_OTHER): Payer: Medicare Other | Admitting: Internal Medicine

## 2013-01-28 VITALS — BP 152/87 | HR 61 | Ht 71.0 in | Wt 212.8 lb

## 2013-01-28 DIAGNOSIS — Z7901 Long term (current) use of anticoagulants: Secondary | ICD-10-CM

## 2013-01-28 DIAGNOSIS — Z950 Presence of cardiac pacemaker: Secondary | ICD-10-CM

## 2013-01-28 DIAGNOSIS — I498 Other specified cardiac arrhythmias: Secondary | ICD-10-CM

## 2013-01-28 DIAGNOSIS — I10 Essential (primary) hypertension: Secondary | ICD-10-CM

## 2013-01-28 DIAGNOSIS — I4891 Unspecified atrial fibrillation: Secondary | ICD-10-CM

## 2013-01-28 LAB — PACEMAKER DEVICE OBSERVATION: BRDY-0002RV: 50 {beats}/min

## 2013-01-28 LAB — POCT INR: INR: 1.8

## 2013-01-28 MED ORDER — METOPROLOL TARTRATE 25 MG PO TABS: 25.0000 mg | ORAL_TABLET | Freq: Two times a day (BID) | ORAL | Status: DC

## 2013-01-28 NOTE — Patient Instructions (Addendum)
Your physician recommends that you continue on your current medications as directed. Please refer to the Current Medication list given to you today.ADa  Your physician wants you to follow-up in: one year with Dr. Ladona Ridgel.  You will receive a reminder letter in the mail two months in advance. If you don't receive a letter, please call our office to schedule the follow-up appointment.

## 2013-01-31 ENCOUNTER — Encounter: Payer: Self-pay | Admitting: Internal Medicine

## 2013-01-31 NOTE — Assessment & Plan Note (Signed)
The patient has some intermittent atrial over sensing. This appears to be noise. No indication for lead revision at this time. He will undergo watchful waiting.

## 2013-01-31 NOTE — Progress Notes (Signed)
HPI [name] returns today for followup. He is a very pleasant 79 year old man with a history of persistent atrial fibrillation, hypertension, symptomatic bradycardia, status post permanent pacemaker insertion. The patient has done well in the interim. He denies chest pain or shortness of breath. He has minimal peripheral edema. No syncope. Allergies  Allergen Reactions  . Sulfonamide Derivatives     REACTION: n/v     Current Outpatient Prescriptions  Medication Sig Dispense Refill  . acetaminophen (TYLENOL ARTHRITIS PAIN) 650 MG CR tablet Take 650 mg by mouth as needed.       Marland Kitchen amLODipine (NORVASC) 5 MG tablet TAKE 1 TABLET BY MOUTH EVERY DAY  90 tablet  1  . Ascorbic Acid (VITAMIN C) 500 MG tablet Take 500 mg by mouth daily.        Marland Kitchen aspirin 81 MG tablet Take 81 mg by mouth daily.        Marland Kitchen glucosamine-chondroitin 500-400 MG tablet Take 1 tablet by mouth 2 (two) times daily.        . metoprolol tartrate (LOPRESSOR) 25 MG tablet Take 1 tablet (25 mg total) by mouth 2 (two) times daily.  180 tablet  3  . Multiple Vitamin (MULTIVITAMIN) tablet Take 1 tablet by mouth daily.        . nitroGLYCERIN (NITROSTAT) 0.4 MG SL tablet Place 1 tablet (0.4 mg total) under the tongue every 5 (five) minutes as needed.  90 tablet  6  . simvastatin (ZOCOR) 20 MG tablet Take 1 tablet (20 mg total) by mouth at bedtime.  90 tablet  5  . tamsulosin (FLOMAX) 0.4 MG CAPS capsule TAKE ONE CAPSULE BY MOUTH DAILY AFTER SUPPER  90 capsule  1  . traMADol (ULTRAM) 50 MG tablet Take 1 tablet (50 mg total) by mouth every 6 (six) hours as needed for pain.  90 tablet  1  . warfarin (COUMADIN) 5 MG tablet TAKE 1 TABLET BY MOUTH EVERY DAY  90 tablet  1   No current facility-administered medications for this visit.     Past Medical History  Diagnosis Date  . Paroxysmal atrial fibrillation     s/p Guidant Insignia pacemaker; started sotalol 06/2009  . Tachy-brady syndrome   . Coronary artery disease     one vessel  CAD with normal EF; Cath 12/2006. Ef normal. LM nl, LAD 40%, D2 occulded with collaterals, LCX 40%, RCA 30%; Echo 02/2009 EF 50-55% grade I diastolic dysfunction.  Marland Kitchen History of gallstones     status post cholecystectomy  . Hypertension   . Hyperlipidemia   . History of peptic ulcer disease   . Osteoarthritis   . Benign prostatic hypertrophy   . Obesity   . History of nephrolithiasis   . Diverticulosis of colon   . Testosterone deficiency   . ED (erectile dysfunction)     ROS:   All systems reviewed and negative except as noted in the HPI.   Past Surgical History  Procedure Laterality Date  . Cholecystectomy    . Insert / replace / remove pacemaker    . Cardiac catheterization    . Colonoscopy  1999  . Upper gastroduodenoscopy  1999  . Doppler evaluation       Family History  Problem Relation Age of Onset  . Hypertension Mother   . Heart disease Mother   . Diabetes Mother   . Heart attack Father   . Diabetes Brother   . Hypertension Brother   . Heart disease Brother   .  Diabetes Brother   . Hypertension Brother   . Heart disease Brother      History   Social History  . Marital Status: Married    Spouse Name: N/A    Number of Children: N/A  . Years of Education: N/A   Occupational History  . Not on file.   Social History Main Topics  . Smoking status: Never Smoker   . Smokeless tobacco: Never Used  . Alcohol Use: No  . Drug Use: No  . Sexual Activity: Not on file   Other Topics Concern  . Not on file   Social History Narrative  . No narrative on file     BP 152/87  Pulse 61  Ht 5\' 11"  (1.803 m)  Wt 212 lb 12.8 oz (96.525 kg)  BMI 29.69 kg/m2  Physical Exam:  Well appearing elderly man, NAD HEENT: Unremarkable Neck: 6 cm JVD, no thyromegally Lungs:  Clear with no wheezes, rales, or rhonchi. HEART:  Regular rate rhythm, no murmurs, no rubs, no clicks Abd:  soft, positive bowel sounds, no organomegally, no rebound, no guarding Ext:  2  plus pulses, no edema, no cyanosis, no clubbing Skin:  No rashes no nodules Neuro:  CN II through XII intact, motor grossly intact  ECG - normal sinus rhythm with ventricular pacing and sensing  DEVICE  Normal device function except atrial noise is recorded.  See PaceArt for details.   Assess/Plan:

## 2013-01-31 NOTE — Assessment & Plan Note (Signed)
The patient appears to be maintaining sinus rhythm on beta blocker therapy alone. He will continue his chronic anticoagulation. No other medical changes.

## 2013-01-31 NOTE — Assessment & Plan Note (Signed)
His systolic blood pressure is elevated today. I've asked the patient to reduce his salt intake. He may require up titration of his medical therapy.

## 2013-02-07 ENCOUNTER — Other Ambulatory Visit: Payer: Self-pay | Admitting: Internal Medicine

## 2013-03-04 ENCOUNTER — Encounter: Payer: Self-pay | Admitting: *Deleted

## 2013-03-05 ENCOUNTER — Ambulatory Visit (INDEPENDENT_AMBULATORY_CARE_PROVIDER_SITE_OTHER): Payer: Medicare Other | Admitting: Internal Medicine

## 2013-03-05 ENCOUNTER — Encounter: Payer: Self-pay | Admitting: Internal Medicine

## 2013-03-05 VITALS — BP 110/70 | HR 87 | Temp 98.6°F | Resp 20 | Ht 68.5 in | Wt 215.0 lb

## 2013-03-05 DIAGNOSIS — I4891 Unspecified atrial fibrillation: Secondary | ICD-10-CM

## 2013-03-05 DIAGNOSIS — M1990 Unspecified osteoarthritis, unspecified site: Secondary | ICD-10-CM

## 2013-03-05 DIAGNOSIS — Z950 Presence of cardiac pacemaker: Secondary | ICD-10-CM

## 2013-03-05 DIAGNOSIS — E785 Hyperlipidemia, unspecified: Secondary | ICD-10-CM

## 2013-03-05 DIAGNOSIS — I5032 Chronic diastolic (congestive) heart failure: Secondary | ICD-10-CM

## 2013-03-05 DIAGNOSIS — I2510 Atherosclerotic heart disease of native coronary artery without angina pectoris: Secondary | ICD-10-CM

## 2013-03-05 DIAGNOSIS — I10 Essential (primary) hypertension: Secondary | ICD-10-CM

## 2013-03-05 DIAGNOSIS — Z23 Encounter for immunization: Secondary | ICD-10-CM

## 2013-03-05 DIAGNOSIS — Z0000 Encounter for general adult medical examination without abnormal findings: Secondary | ICD-10-CM

## 2013-03-05 LAB — COMPREHENSIVE METABOLIC PANEL
Albumin: 4.2 g/dL (ref 3.5–5.2)
BUN: 21 mg/dL (ref 6–23)
CO2: 23 mEq/L (ref 19–32)
Calcium: 9.8 mg/dL (ref 8.4–10.5)
Chloride: 104 mEq/L (ref 96–112)
GFR: 48.48 mL/min — ABNORMAL LOW (ref >60.00)
Glucose, Bld: 100 mg/dL — ABNORMAL HIGH (ref 70–99)
Potassium: 4.4 mEq/L (ref 3.5–5.1)
Sodium: 137 mEq/L (ref 135–145)
Total Protein: 7.8 g/dL (ref 6.0–8.3)

## 2013-03-05 LAB — CBC WITH DIFFERENTIAL/PLATELET
Eosinophils Absolute: 0.2 10*3/uL (ref 0.0–0.7)
Eosinophils Relative: 1.9 % (ref 0.0–5.0)
Lymphocytes Relative: 17.7 % (ref 12.0–46.0)
MCV: 98.6 fl (ref 78.0–100.0)
Monocytes Absolute: 1.2 10*3/uL — ABNORMAL HIGH (ref 0.1–1.0)
Monocytes Relative: 10.8 % (ref 3.0–12.0)
Neutrophils Relative %: 69.2 % (ref 43.0–77.0)
Platelets: 277 10*3/uL (ref 150.0–400.0)
RDW: 13 % (ref 11.5–14.6)
WBC: 10.9 10*3/uL — ABNORMAL HIGH (ref 4.5–10.5)

## 2013-03-05 LAB — LIPID PANEL
Cholesterol: 148 mg/dL (ref 0–200)
LDL Cholesterol: 73 mg/dL (ref 0–99)
Triglycerides: 118 mg/dL (ref 0.0–149.0)

## 2013-03-05 LAB — TSH: TSH: 0.49 u[IU]/mL (ref 0.35–5.50)

## 2013-03-05 NOTE — Progress Notes (Signed)
Subjective:    Patient ID: Francisco Merritt, male    DOB: 15-Sep-1929, 79 y.o.   MRN: [id number]  HPI Pre-visit discussion using our clinic review tool. No additional management support is needed unless otherwise documented below in the visit note.   79 year-old patient who is seen today for an annual health assessment. He is followed closely by cardiology and has had a recent urological evaluation as well. He is doing quite well today and denies any cardiopulmonary complaints.  History well and denies any cardiopulmonary complaints. He remains on anticoagulation for atrial for relation.  1. Risk factors, based on past  M,S,F history- patient has known coronary artery disease cardiovascular risk factors include dyslipidemia hypertension  2.  Physical activities: Remains quite active does complain of chronic fatigue but remains very active with gardening. Does use home exercise equipment periodically  3.  Depression/mood: No history depression or mood disorder  4.  Hearing: Mild to moderate impairment  5.  ADL's: Independent in all aspects of daily living  6.  Fall risk: Moderate due to mild gait instability  7.  Home safety: No problems identified  8.  Height weight, and visual acuity; height and weight stable states that his distance vision has fallen off a bit and is scheduled to see his eye doctor soon  9.  Counseling: Heart healthy diet more regular exercise and modest weight loss all encouraged  10. Lab orders based on risk factors: Laboratory profile from 2 months ago reviewed  45. Referral: Followup cardiology next week  12. Care plan: Heart healthy salt restricted diet encouraged regular exercise and modest weight loss recommended 13. Cognitive assessment: Alert and oriented with normal affect. No cognitive dysfunction     Alcohol-Tobacco  Smoking Status: never   Allergies:  1) ! Sulfa   Past History:  Past Medical History:  1. Paroxysmal atrial fibrillation with  tachybrady syndrome.  --s/p Guidant Insignia pacemaker  --stqarted sotalol 3/11  2. One vessel coronary artery disease with normal ejection fraction.  --Cath 9/08. EF normal. LM nl, LAD 40%, D2 occulded with collaterals, LCX 40%, RCA 30%  --Echo 11/10 EF 50-55% grade I diastolic dysfunction.  3. History of gallstones status post cholecystectomy.  4. Hypertension.  5. Hyperlipidemia.  6. History of peptic ulcer disease.  7. Osteoarthritis  8. Benign prostatic hypertrophy  9. Obesity  10.Nephrolithiasis, hx of  11. Diverticulosis, colon  12. Testosterone deficiency / ED  Past Surgical History:   Cholecystectomy  cardiac catheterization  colonoscopy 1999  upper gastroduodenoscopy, 1999  status post pacemaker  carotid artery. Doppler evaluation   Family History:     father died at 34 of an MI. Mother died at 59 of hypertension, coronary disease, and palpitations of diabetes two brothers, positive for diabetes, hypertension, coronary artery disease ; one brother with colon cancer one brother deceased due to complications of CAD and stroke  Social History:   [place] 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  No tobacco or alcohol use.     Review of Systems  Constitutional: Positive for fatigue. Negative for fever, chills, activity change and appetite change.  HENT: Negative for congestion, dental problem, ear pain, hearing loss, mouth sores, rhinorrhea, sinus pressure, sneezing, tinnitus, trouble swallowing and voice change.   Eyes: Negative for photophobia, pain, redness and visual disturbance.  Respiratory: Negative for apnea, cough, choking, chest tightness, shortness of breath and wheezing.  Cardiovascular: Negative for chest pain, palpitations and leg swelling.  Gastrointestinal: Negative for nausea, vomiting, abdominal pain, diarrhea, constipation, blood in stool, abdominal distention, anal  bleeding and rectal pain.  Genitourinary: Negative for dysuria, urgency, frequency, hematuria, flank pain, decreased urine volume, discharge, penile swelling, scrotal swelling, difficulty urinating, genital sores and testicular pain.  Musculoskeletal: Negative for arthralgias, back pain, gait problem, joint swelling, myalgias, neck pain and neck stiffness.  Skin: Negative for color change, rash and wound.  Neurological: Negative for dizziness, tremors, seizures, syncope, facial asymmetry, speech difficulty, weakness, light-headedness, numbness and headaches.  Hematological: Negative for adenopathy. Does not bruise/bleed easily.  Psychiatric/Behavioral: Negative for suicidal ideas, hallucinations, behavioral problems, confusion, sleep disturbance, self-injury, dysphoric mood, decreased concentration and agitation. The patient is not nervous/anxious.        Objective:   Physical Exam  Constitutional: He appears well-developed and well-nourished.  HENT:  Head: Normocephalic and atraumatic.  Right Ear: External ear normal.  Left Ear: External ear normal.  Nose: Nose normal.  Mouth/Throat: Oropharynx is clear and moist.  Eyes: Conjunctivae and EOM are normal. Pupils are equal, round, and reactive to light. No scleral icterus.  Neck: Normal range of motion. Neck supple. No JVD present. No thyromegaly present.  Cardiovascular: Regular rhythm, normal heart sounds and intact distal pulses.  Exam reveals no gallop and no friction rub.   No murmur heard. Pedal pulses intact  Pulmonary/Chest: Effort normal and breath sounds normal. He exhibits no tenderness.  Pacemaker noted left upper anterior chest wall  Abdominal: Soft. Bowel sounds are normal. He exhibits no distension and no mass. There is no tenderness.  Genitourinary: Penis normal.  Musculoskeletal: Normal range of motion. He exhibits edema. He exhibits no tenderness.  Trace pedal edema  Lymphadenopathy:    He has no cervical adenopathy.   Neurological: He is alert. He has normal reflexes. No cranial nerve deficit. Coordination normal.  Skin: Skin is warm and dry. No rash noted.  Psychiatric: He has a normal mood and affect. His behavior is normal.          Assessment & Plan:   Coronary artery disease. Clinically stable Paroxysmal atrial fibrillation. Followup cardiology. Will continue on Coumadin anticoagulation Hypertension well controlled Dyslipidemia stable on simvastatin 20 mg daily

## 2013-03-05 NOTE — Progress Notes (Signed)
Pre-visit discussion using our clinic review tool. No additional management support is needed unless otherwise documented below in the visit note.  

## 2013-03-05 NOTE — Patient Instructions (Signed)
Limit your sodium (Salt) intake    It is important that you exercise regularly, at least 20 minutes 3 to 4 times per week.  If you develop chest pain or shortness of breath seek  medical attention.  Return in 6 months for follow-up  

## 2013-03-18 ENCOUNTER — Ambulatory Visit (INDEPENDENT_AMBULATORY_CARE_PROVIDER_SITE_OTHER): Payer: Medicare Other | Admitting: General Practice

## 2013-03-18 DIAGNOSIS — I4891 Unspecified atrial fibrillation: Secondary | ICD-10-CM

## 2013-03-18 DIAGNOSIS — Z7901 Long term (current) use of anticoagulants: Secondary | ICD-10-CM

## 2013-03-18 LAB — POCT INR: INR: 2.2

## 2013-03-18 NOTE — Progress Notes (Signed)
Pre-visit discussion using our clinic review tool. No additional management support is needed unless otherwise documented below in the visit note.  

## 2013-03-22 ENCOUNTER — Encounter: Payer: Self-pay | Admitting: Internal Medicine

## 2013-03-22 DIAGNOSIS — I495 Sick sinus syndrome: Secondary | ICD-10-CM

## 2013-03-31 ENCOUNTER — Encounter: Payer: Self-pay | Admitting: Internal Medicine

## 2013-04-29 ENCOUNTER — Ambulatory Visit (INDEPENDENT_AMBULATORY_CARE_PROVIDER_SITE_OTHER): Payer: Medicare HMO | Admitting: General Practice

## 2013-04-29 DIAGNOSIS — Z7901 Long term (current) use of anticoagulants: Secondary | ICD-10-CM

## 2013-04-29 DIAGNOSIS — I4891 Unspecified atrial fibrillation: Secondary | ICD-10-CM

## 2013-04-29 LAB — POCT INR: INR: 1.7

## 2013-04-29 NOTE — Progress Notes (Signed)
Pre-visit discussion using our clinic review tool. No additional management support is needed unless otherwise documented below in the visit note.  

## 2013-05-17 ENCOUNTER — Other Ambulatory Visit: Payer: Self-pay | Admitting: Internal Medicine

## 2013-05-26 ENCOUNTER — Other Ambulatory Visit: Payer: Self-pay | Admitting: Internal Medicine

## 2013-06-10 ENCOUNTER — Ambulatory Visit: Payer: Medicare HMO

## 2013-06-14 ENCOUNTER — Ambulatory Visit (INDEPENDENT_AMBULATORY_CARE_PROVIDER_SITE_OTHER): Payer: Medicare HMO | Admitting: Family

## 2013-06-14 DIAGNOSIS — Z7901 Long term (current) use of anticoagulants: Secondary | ICD-10-CM

## 2013-06-14 DIAGNOSIS — I4891 Unspecified atrial fibrillation: Secondary | ICD-10-CM

## 2013-06-14 LAB — POCT INR: INR: 2.4

## 2013-06-14 NOTE — Progress Notes (Signed)
Pre visit review using our clinic review tool, if applicable. No additional management support is needed unless otherwise documented below in the visit note. 

## 2013-06-14 NOTE — Patient Instructions (Signed)
continue taking 1/2 tablet daily except on Sundays take 1 tablet. Recheck in 6 week  Anticoagulation Dose Instructions as of 06/14/2013     Dorene Grebe Tue Wed Thu Fri Sat   New Dose 5 mg 2.5 mg 2.5 mg 2.5 mg 2.5 mg 2.5 mg 2.5 mg    Description        continue taking 1/2 tablet daily except on Sundays take 1 tablet. Recheck in 6 weeks.

## 2013-06-21 DIAGNOSIS — I495 Sick sinus syndrome: Secondary | ICD-10-CM

## 2013-06-25 ENCOUNTER — Other Ambulatory Visit: Payer: Self-pay | Admitting: Internal Medicine

## 2013-06-25 ENCOUNTER — Other Ambulatory Visit: Payer: Self-pay | Admitting: General Practice

## 2013-06-25 MED ORDER — WARFARIN SODIUM 5 MG PO TABS: ORAL_TABLET | ORAL | Status: DC

## 2013-07-26 ENCOUNTER — Ambulatory Visit (INDEPENDENT_AMBULATORY_CARE_PROVIDER_SITE_OTHER): Payer: Medicare HMO | Admitting: General Practice

## 2013-07-26 DIAGNOSIS — Z5181 Encounter for therapeutic drug level monitoring: Secondary | ICD-10-CM | POA: Insufficient documentation

## 2013-07-26 DIAGNOSIS — I4891 Unspecified atrial fibrillation: Secondary | ICD-10-CM

## 2013-07-26 LAB — POCT INR: INR: 2.3

## 2013-07-26 NOTE — Progress Notes (Signed)
Pre visit review using our clinic review tool, if applicable. No additional management support is needed unless otherwise documented below in the visit note. 

## 2013-08-31 ENCOUNTER — Encounter: Payer: Self-pay | Admitting: Internal Medicine

## 2013-08-31 ENCOUNTER — Ambulatory Visit (INDEPENDENT_AMBULATORY_CARE_PROVIDER_SITE_OTHER): Payer: Medicare HMO | Admitting: Internal Medicine

## 2013-08-31 VITALS — BP 140/70 | HR 81 | Temp 97.6°F | Resp 20 | Ht 68.5 in | Wt 220.0 lb

## 2013-08-31 DIAGNOSIS — I4891 Unspecified atrial fibrillation: Secondary | ICD-10-CM

## 2013-08-31 DIAGNOSIS — I5032 Chronic diastolic (congestive) heart failure: Secondary | ICD-10-CM

## 2013-08-31 DIAGNOSIS — E785 Hyperlipidemia, unspecified: Secondary | ICD-10-CM

## 2013-08-31 DIAGNOSIS — I2510 Atherosclerotic heart disease of native coronary artery without angina pectoris: Secondary | ICD-10-CM

## 2013-08-31 DIAGNOSIS — I10 Essential (primary) hypertension: Secondary | ICD-10-CM

## 2013-08-31 MED ORDER — TRAMADOL HCL 50 MG PO TABS: 50.0000 mg | ORAL_TABLET | Freq: Four times a day (QID) | ORAL | Status: DC | PRN

## 2013-08-31 NOTE — Progress Notes (Signed)
Pre-visit discussion using our clinic review tool. No additional management support is needed unless otherwise documented below in the visit note.  

## 2013-08-31 NOTE — Patient Instructions (Signed)
Limit your sodium (Salt) intake  Return in 6 months for follow-up  

## 2013-08-31 NOTE — Progress Notes (Signed)
Subjective:    Patient ID: Francisco Merritt, male    DOB: 27-Jul-1929, 80 y.o.   MRN: [id number]  HPI  80 year old patient who is seen today for his six-month followup.  He is followed by cardiology.  He has coronary artery disease, and history of tachybradycardia syndrome status post pacemaker insertion.  He has chronic compensated diastolic heart failure, treated, hypertension.  He is doing quite well except for some mild fatigue.  He does work on a farm hours at a time.  He wonders if perhaps his medications are the cause of his fatigue.  His cardiac status appears stable.  He remains on chronic Coumadin anticoagulation.  No change in his medications, which includes a modest dose of metoprolol  Family history.  Brother with recent diagnosis of colon cancer  Past Medical History  Diagnosis Date  . Paroxysmal atrial fibrillation     s/p Guidant Insignia pacemaker; started sotalol 06/2009  . Tachy-brady syndrome   . Coronary artery disease     one vessel CAD with normal EF; Cath 12/2006. Ef normal. LM nl, LAD 40%, D2 occulded with collaterals, LCX 40%, RCA 30%; Echo 02/2009 EF 36-64% grade I diastolic dysfunction.  Marland Kitchen History of gallstones     status post cholecystectomy  . Hypertension   . Hyperlipidemia   . History of peptic ulcer disease   . Osteoarthritis   . Benign prostatic hypertrophy   . Obesity   . History of nephrolithiasis   . Diverticulosis of colon   . Testosterone deficiency   . ED (erectile dysfunction)     History   Social History  . Marital Status: Married    Spouse Name: N/A    Number of Children: N/A  . Years of Education: N/A   Occupational History  . Not on file.   Social History Main Topics  . Smoking status: Never Smoker   . Smokeless tobacco: Never Used  . Alcohol Use: No  . Drug Use: No  . Sexual Activity: Not on file   Other Topics Concern  . Not on file   Social History Narrative  . No narrative on file    Past Surgical History  Procedure  Laterality Date  . Cholecystectomy    . Insert / replace / remove pacemaker    . Cardiac catheterization    . Colonoscopy  1999  . Upper gastroduodenoscopy  1999  . Doppler evaluation      Family History  Problem Relation Age of Onset  . Hypertension Mother   . Heart disease Mother   . Diabetes Mother   . Heart attack Father   . Diabetes Brother   . Hypertension Brother   . Heart disease Brother   . Diabetes Brother   . Hypertension Brother   . Heart disease Brother     Allergies  Allergen Reactions  . Sulfonamide Derivatives     REACTION: n/v    Current Outpatient Prescriptions on File Prior to Visit  Medication Sig Dispense Refill  . acetaminophen (TYLENOL ARTHRITIS PAIN) 650 MG CR tablet Take 650 mg by mouth as needed.       Marland Kitchen amLODipine (NORVASC) 5 MG tablet TAKE 1 TABLET BY MOUTH EVERY DAY  90 tablet  2  . Ascorbic Acid (VITAMIN C) 500 MG tablet Take 500 mg by mouth daily.        Marland Kitchen aspirin 81 MG tablet Take 81 mg by mouth daily.        Marland Kitchen glucosamine-chondroitin 500-400 MG tablet  Take 1 tablet by mouth 2 (two) times daily.        . metoprolol tartrate (LOPRESSOR) 25 MG tablet Take 1 tablet (25 mg total) by mouth 2 (two) times daily.  180 tablet  3  . Multiple Vitamin (MULTIVITAMIN) tablet Take 1 tablet by mouth daily.        . nitroGLYCERIN (NITROSTAT) 0.4 MG SL tablet Place 1 tablet (0.4 mg total) under the tongue every 5 (five) minutes as needed.  90 tablet  6  . simvastatin (ZOCOR) 20 MG tablet TAKE 1 TABLET BY MOUTH AT BEDTIME  90 tablet  3  . tamsulosin (FLOMAX) 0.4 MG CAPS capsule TAKE ONE CAPSULE BY MOUTH DAILY AFTER SUPPER  90 capsule  1  . warfarin (COUMADIN) 5 MG tablet Take as directed by anticoagulation clinic  90 tablet  1   No current facility-administered medications on file prior to visit.    BP 140/70  Pulse 81  Temp(Src) 97.6 F (36.4 C) (Oral)  Resp 20  Ht 5' 8.5" (1.74 m)  Wt 220 lb (99.791 kg)  BMI 32.96 kg/m2  SpO2  98%       Review of Systems  Constitutional: Positive for fatigue. Negative for fever, chills and appetite change.  HENT: Negative for congestion, dental problem, ear pain, hearing loss, sore throat, tinnitus, trouble swallowing and voice change.   Eyes: Negative for pain, discharge and visual disturbance.  Respiratory: Negative for cough, chest tightness, wheezing and stridor.   Cardiovascular: Negative for chest pain, palpitations and leg swelling.  Gastrointestinal: Negative for nausea, vomiting, abdominal pain, diarrhea, constipation, blood in stool and abdominal distention.  Genitourinary: Negative for urgency, hematuria, flank pain, discharge, difficulty urinating and genital sores.  Musculoskeletal: Negative for arthralgias, back pain, gait problem, joint swelling, myalgias and neck stiffness.  Skin: Negative for rash.  Neurological: Negative for dizziness, syncope, speech difficulty, weakness, numbness and headaches.  Hematological: Negative for adenopathy. Does not bruise/bleed easily.  Psychiatric/Behavioral: Negative for behavioral problems and dysphoric mood. The patient is not nervous/anxious.        Objective:   Physical Exam  Constitutional: He is oriented to person, place, and time. He appears well-developed.  HENT:  Head: Normocephalic.  Right Ear: External ear normal.  Left Ear: External ear normal.  Eyes: Conjunctivae and EOM are normal.  Neck: Normal range of motion.  Cardiovascular: Normal rate and normal heart sounds.   Irregular rhythm with controlled ventricular response  Pulmonary/Chest: He has rales.  Bibasilar crackles, right greater than the left  Abdominal: Bowel sounds are normal.  Musculoskeletal: Normal range of motion. He exhibits no edema and no tenderness.  Neurological: He is alert and oriented to person, place, and time.  Psychiatric: He has a normal mood and affect. His behavior is normal.          Assessment & Plan:   Hypertension  well controlled Diastolic heart failure, compensated Chronic atrial fibrillation.  Continue Coumadin anticoagulation and rate control Coronary artery disease.  Followup cardiology  No change in medical regimen CPX 6 months

## 2013-09-06 ENCOUNTER — Ambulatory Visit: Payer: Medicare HMO

## 2013-09-09 ENCOUNTER — Ambulatory Visit (INDEPENDENT_AMBULATORY_CARE_PROVIDER_SITE_OTHER): Payer: Medicare HMO | Admitting: Family

## 2013-09-09 DIAGNOSIS — I4891 Unspecified atrial fibrillation: Secondary | ICD-10-CM

## 2013-09-09 DIAGNOSIS — Z7901 Long term (current) use of anticoagulants: Secondary | ICD-10-CM

## 2013-09-09 DIAGNOSIS — Z5181 Encounter for therapeutic drug level monitoring: Secondary | ICD-10-CM

## 2013-09-09 LAB — POCT INR: INR: 2.3

## 2013-09-09 NOTE — Patient Instructions (Signed)
Continue taking 1/2 tablet daily except on Sundays take 1 tablet. Recheck in 6 weeks.   Anticoagulation Dose Instructions as of 09/09/2013     Dorene Grebe Tue Wed Thu Fri Sat   New Dose 5 mg 2.5 mg 2.5 mg 2.5 mg 2.5 mg 2.5 mg 2.5 mg    Description       Continue taking 1/2 tablet daily except on Sundays take 1 tablet. Recheck in 6 weeks.

## 2013-09-20 ENCOUNTER — Encounter: Payer: Self-pay | Admitting: Internal Medicine

## 2013-09-20 DIAGNOSIS — I495 Sick sinus syndrome: Secondary | ICD-10-CM

## 2013-10-21 ENCOUNTER — Ambulatory Visit (INDEPENDENT_AMBULATORY_CARE_PROVIDER_SITE_OTHER): Payer: Commercial Managed Care - HMO | Admitting: General Practice

## 2013-10-21 DIAGNOSIS — Z5181 Encounter for therapeutic drug level monitoring: Secondary | ICD-10-CM

## 2013-10-21 DIAGNOSIS — I4891 Unspecified atrial fibrillation: Secondary | ICD-10-CM

## 2013-10-21 LAB — POCT INR: INR: 2.4

## 2013-10-21 NOTE — Progress Notes (Signed)
Pre visit review using our clinic review tool, if applicable. No additional management support is needed unless otherwise documented below in the visit note. 

## 2013-12-02 ENCOUNTER — Ambulatory Visit (INDEPENDENT_AMBULATORY_CARE_PROVIDER_SITE_OTHER): Payer: Commercial Managed Care - HMO | Admitting: Family

## 2013-12-02 ENCOUNTER — Ambulatory Visit: Payer: Commercial Managed Care - HMO

## 2013-12-02 DIAGNOSIS — I4891 Unspecified atrial fibrillation: Secondary | ICD-10-CM

## 2013-12-02 DIAGNOSIS — Z7901 Long term (current) use of anticoagulants: Secondary | ICD-10-CM

## 2013-12-02 DIAGNOSIS — Z5181 Encounter for therapeutic drug level monitoring: Secondary | ICD-10-CM

## 2013-12-02 LAB — POCT INR: INR: 2.6

## 2013-12-02 NOTE — Patient Instructions (Signed)
Continue taking 1/2 tablet daily except on Sundays take 1 tablet. Recheck in 6 weeks.   Anticoagulation Dose Instructions as of 12/02/2013     Dorene Grebe Tue Wed Thu Fri Sat   New Dose 5 mg 2.5 mg 2.5 mg 2.5 mg 2.5 mg 2.5 mg 2.5 mg    Description       Continue taking 1/2 tablet daily except on Sundays take 1 tablet. Recheck in 6 weeks.

## 2013-12-06 ENCOUNTER — Other Ambulatory Visit: Payer: Self-pay | Admitting: Internal Medicine

## 2013-12-21 DIAGNOSIS — I495 Sick sinus syndrome: Secondary | ICD-10-CM

## 2013-12-22 ENCOUNTER — Telehealth: Payer: Self-pay | Admitting: Internal Medicine

## 2013-12-22 DIAGNOSIS — D229 Melanocytic nevi, unspecified: Secondary | ICD-10-CM

## 2013-12-22 NOTE — Telephone Encounter (Signed)
Pt needs referral to [facility] [phone]. Pt has [employer] ins and needs a referral. Pt has lesion below eye .

## 2013-12-22 NOTE — Telephone Encounter (Signed)
Spoke to pt, asked him what type of referral does he need? 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 Told pt okay will send order for referral and someone will contact him. Pt verbalized understanding.

## 2013-12-28 ENCOUNTER — Telehealth: Payer: Self-pay | Admitting: Internal Medicine

## 2013-12-28 NOTE — Telephone Encounter (Signed)
Dr. Arlis Porta, MD  Dermatologist  Address: [address] [place], [place] [address]  [phone] THEIR OFFICE WILL not scheduled pt until they receive a fax copy of authorization  Pt has been approved but they insiting a fax copy request the fax copy to be sent to their office awaiting a copy pt informed

## 2014-01-06 ENCOUNTER — Inpatient Hospital Stay (HOSPITAL_COMMUNITY)
Admission: EM | Admit: 2014-01-06 | Discharge: 2014-01-09 | DRG: 308 | Disposition: A | Discharge status: Home Health | Payer: Medicare HMO | Attending: Internal Medicine | Admitting: Internal Medicine

## 2014-01-06 ENCOUNTER — Encounter (HOSPITAL_COMMUNITY): Payer: Self-pay | Admitting: Emergency Medicine

## 2014-01-06 ENCOUNTER — Emergency Department (HOSPITAL_COMMUNITY): Payer: Medicare HMO

## 2014-01-06 DIAGNOSIS — Z950 Presence of cardiac pacemaker: Secondary | ICD-10-CM | POA: Diagnosis not present

## 2014-01-06 DIAGNOSIS — I2510 Atherosclerotic heart disease of native coronary artery without angina pectoris: Secondary | ICD-10-CM | POA: Diagnosis present

## 2014-01-06 DIAGNOSIS — N183 Chronic kidney disease, stage 3 unspecified: Secondary | ICD-10-CM | POA: Diagnosis present

## 2014-01-06 DIAGNOSIS — I5032 Chronic diastolic (congestive) heart failure: Secondary | ICD-10-CM | POA: Diagnosis present

## 2014-01-06 DIAGNOSIS — Z6829 Body mass index (BMI) 29.0-29.9, adult: Secondary | ICD-10-CM

## 2014-01-06 DIAGNOSIS — N400 Enlarged prostate without lower urinary tract symptoms: Secondary | ICD-10-CM | POA: Diagnosis present

## 2014-01-06 DIAGNOSIS — Z79899 Other long term (current) drug therapy: Secondary | ICD-10-CM | POA: Diagnosis not present

## 2014-01-06 DIAGNOSIS — I129 Hypertensive chronic kidney disease with stage 1 through stage 4 chronic kidney disease, or unspecified chronic kidney disease: Secondary | ICD-10-CM | POA: Diagnosis present

## 2014-01-06 DIAGNOSIS — R112 Nausea with vomiting, unspecified: Secondary | ICD-10-CM

## 2014-01-06 DIAGNOSIS — I5033 Acute on chronic diastolic (congestive) heart failure: Secondary | ICD-10-CM | POA: Diagnosis present

## 2014-01-06 DIAGNOSIS — I482 Chronic atrial fibrillation, unspecified: Secondary | ICD-10-CM

## 2014-01-06 DIAGNOSIS — R55 Syncope and collapse: Secondary | ICD-10-CM | POA: Diagnosis present

## 2014-01-06 DIAGNOSIS — E785 Hyperlipidemia, unspecified: Secondary | ICD-10-CM | POA: Diagnosis present

## 2014-01-06 DIAGNOSIS — M1990 Unspecified osteoarthritis, unspecified site: Secondary | ICD-10-CM | POA: Diagnosis present

## 2014-01-06 DIAGNOSIS — I4891 Unspecified atrial fibrillation: Principal | ICD-10-CM | POA: Diagnosis present

## 2014-01-06 DIAGNOSIS — I10 Essential (primary) hypertension: Secondary | ICD-10-CM

## 2014-01-06 DIAGNOSIS — I509 Heart failure, unspecified: Secondary | ICD-10-CM | POA: Diagnosis present

## 2014-01-06 DIAGNOSIS — I5042 Chronic combined systolic (congestive) and diastolic (congestive) heart failure: Secondary | ICD-10-CM

## 2014-01-06 DIAGNOSIS — E669 Obesity, unspecified: Secondary | ICD-10-CM | POA: Diagnosis present

## 2014-01-06 DIAGNOSIS — Z7982 Long term (current) use of aspirin: Secondary | ICD-10-CM

## 2014-01-06 DIAGNOSIS — R42 Dizziness and giddiness: Secondary | ICD-10-CM | POA: Diagnosis present

## 2014-01-06 DIAGNOSIS — I429 Cardiomyopathy, unspecified: Secondary | ICD-10-CM | POA: Diagnosis present

## 2014-01-06 DIAGNOSIS — I498 Other specified cardiac arrhythmias: Secondary | ICD-10-CM

## 2014-01-06 DIAGNOSIS — K219 Gastro-esophageal reflux disease without esophagitis: Secondary | ICD-10-CM

## 2014-01-06 DIAGNOSIS — Z23 Encounter for immunization: Secondary | ICD-10-CM | POA: Diagnosis not present

## 2014-01-06 DIAGNOSIS — Z7901 Long term (current) use of anticoagulants: Secondary | ICD-10-CM | POA: Diagnosis not present

## 2014-01-06 DIAGNOSIS — I4819 Other persistent atrial fibrillation: Secondary | ICD-10-CM | POA: Diagnosis present

## 2014-01-06 LAB — URINALYSIS, ROUTINE W REFLEX MICROSCOPIC
Bilirubin Urine: NEGATIVE
GLUCOSE, UA: NEGATIVE mg/dL
Hgb urine dipstick: NEGATIVE
Ketones, ur: 15 mg/dL — AB
LEUKOCYTES UA: NEGATIVE
Nitrite: NEGATIVE
Protein, ur: NEGATIVE mg/dL
Specific Gravity, Urine: 1.023 (ref 1.005–1.030)
UROBILINOGEN UA: 0.2 mg/dL (ref 0.0–1.0)
pH: 6 (ref 5.0–8.0)

## 2014-01-06 LAB — CBC WITH DIFFERENTIAL/PLATELET
BASOS PCT: 0 % (ref 0–1)
Basophils Absolute: 0 10*3/uL (ref 0.0–0.1)
EOS ABS: 0.1 10*3/uL (ref 0.0–0.7)
Eosinophils Relative: 1 % (ref 0–5)
HCT: 43.8 % (ref 39.0–52.0)
Hemoglobin: 14.6 g/dL (ref 13.0–17.0)
Lymphocytes Relative: 14 % (ref 12–46)
Lymphs Abs: 1.4 10*3/uL (ref 0.7–4.0)
MCH: 32.4 pg (ref 26.0–34.0)
MCHC: 33.3 g/dL (ref 30.0–36.0)
MCV: 97.1 fL (ref 78.0–100.0)
Monocytes Absolute: 1.1 10*3/uL — ABNORMAL HIGH (ref 0.1–1.0)
Monocytes Relative: 10 % (ref 3–12)
NEUTROS PCT: 75 % (ref 43–77)
Neutro Abs: 7.5 10*3/uL (ref 1.7–7.7)
PLATELETS: 229 10*3/uL (ref 150–400)
RBC: 4.51 MIL/uL (ref 4.22–5.81)
RDW: 13.3 % (ref 11.5–15.5)
WBC: 10.1 10*3/uL (ref 4.0–10.5)

## 2014-01-06 LAB — COMPREHENSIVE METABOLIC PANEL
ALBUMIN: 3.9 g/dL (ref 3.5–5.2)
ALT: 14 U/L (ref 0–53)
ANION GAP: 18 — AB (ref 5–15)
AST: 24 U/L (ref 0–37)
Alkaline Phosphatase: 57 U/L (ref 39–117)
BUN: 25 mg/dL — ABNORMAL HIGH (ref 6–23)
CO2: 21 mEq/L (ref 19–32)
Calcium: 9.7 mg/dL (ref 8.4–10.5)
Chloride: 104 mEq/L (ref 96–112)
Creatinine, Ser: 1.38 mg/dL — ABNORMAL HIGH (ref 0.50–1.35)
GFR calc Af Amer: 54 mL/min — ABNORMAL LOW (ref >90)
GFR calc non Af Amer: 46 mL/min — ABNORMAL LOW (ref >90)
Glucose, Bld: 162 mg/dL — ABNORMAL HIGH (ref 70–99)
Potassium: 4.3 mEq/L (ref 3.7–5.3)
SODIUM: 143 meq/L (ref 137–147)
TOTAL PROTEIN: 7.7 g/dL (ref 6.0–8.3)
Total Bilirubin: 0.6 mg/dL (ref 0.3–1.2)

## 2014-01-06 LAB — PROTIME-INR
INR: 2.67 — ABNORMAL HIGH (ref 0.00–1.49)
Prothrombin Time: 28.4 seconds — ABNORMAL HIGH (ref 11.6–15.2)

## 2014-01-06 LAB — LIPASE, BLOOD: Lipase: 31 U/L (ref 11–59)

## 2014-01-06 LAB — PRO B NATRIURETIC PEPTIDE: Pro B Natriuretic peptide (BNP): 2171 pg/mL — ABNORMAL HIGH (ref 0–450)

## 2014-01-06 LAB — TROPONIN I: Troponin I: <0.3 ng/mL (ref <0.30)

## 2014-01-06 MED ORDER — ACETAMINOPHEN ER 650 MG PO TBCR: 650.0000 mg | EXTENDED_RELEASE_TABLET | Freq: Four times a day (QID) | ORAL | Status: DC | PRN

## 2014-01-06 MED ORDER — ONDANSETRON HCL 4 MG/2ML IJ SOLN
4.0000 mg | Freq: Once | INTRAMUSCULAR | Status: AC
  Administered 2014-01-06: 4 mg via INTRAVENOUS
  Filled 2014-01-06: qty 2

## 2014-01-06 MED ORDER — TRAMADOL HCL 50 MG PO TABS: 50.0000 mg | ORAL_TABLET | Freq: Four times a day (QID) | ORAL | Status: DC | PRN

## 2014-01-06 MED ORDER — AMLODIPINE BESYLATE 5 MG PO TABS
5.0000 mg | ORAL_TABLET | Freq: Every day | ORAL | Status: DC
  Administered 2014-01-06 – 2014-01-07 (×2): 5 mg via ORAL
  Filled 2014-01-06 (×2): qty 1

## 2014-01-06 MED ORDER — ONDANSETRON HCL 4 MG/2ML IJ SOLN: 4.0000 mg | Freq: Four times a day (QID) | INTRAMUSCULAR | Status: DC | PRN

## 2014-01-06 MED ORDER — PANTOPRAZOLE SODIUM 40 MG IV SOLR
40.0000 mg | INTRAVENOUS | Status: DC
  Administered 2014-01-06: 40 mg via INTRAVENOUS
  Filled 2014-01-06 (×2): qty 40

## 2014-01-06 MED ORDER — ASPIRIN 81 MG PO CHEW
81.0000 mg | CHEWABLE_TABLET | Freq: Every day | ORAL | Status: DC
  Administered 2014-01-06 – 2014-01-08 (×3): 81 mg via ORAL
  Filled 2014-01-06 (×3): qty 1

## 2014-01-06 MED ORDER — SIMVASTATIN 20 MG PO TABS
20.0000 mg | ORAL_TABLET | Freq: Every day | ORAL | Status: DC
Start: 2014-01-06 — End: 2014-01-09
  Administered 2014-01-06 – 2014-01-08 (×3): 20 mg via ORAL
  Filled 2014-01-06 (×4): qty 1

## 2014-01-06 MED ORDER — ACETAMINOPHEN 650 MG RE SUPP
650.0000 mg | Freq: Four times a day (QID) | RECTAL | Status: DC | PRN
Start: 2014-01-06 — End: 2014-01-09

## 2014-01-06 MED ORDER — WARFARIN SODIUM 2.5 MG PO TABS
2.5000 mg | ORAL_TABLET | ORAL | Status: AC
  Filled 2014-01-06: qty 1

## 2014-01-06 MED ORDER — ACETAMINOPHEN 325 MG PO TABS: 650.0000 mg | ORAL_TABLET | Freq: Four times a day (QID) | ORAL | Status: DC | PRN

## 2014-01-06 MED ORDER — WARFARIN SODIUM 5 MG PO TABS: 5.0000 mg | ORAL_TABLET | ORAL | Status: DC

## 2014-01-06 MED ORDER — WARFARIN - PHARMACIST DOSING INPATIENT: Freq: Every day | Status: DC

## 2014-01-06 MED ORDER — WARFARIN SODIUM 2.5 MG PO TABS
2.5000 mg | ORAL_TABLET | ORAL | Status: DC
  Administered 2014-01-07: 2.5 mg via ORAL
  Filled 2014-01-06 (×2): qty 1

## 2014-01-06 MED ORDER — METOPROLOL TARTRATE 25 MG PO TABS
25.0000 mg | ORAL_TABLET | Freq: Two times a day (BID) | ORAL | Status: DC
  Administered 2014-01-06 – 2014-01-07 (×2): 25 mg via ORAL
  Filled 2014-01-06 (×3): qty 1

## 2014-01-06 MED ORDER — ASPIRIN 81 MG PO TABS: 81.0000 mg | ORAL_TABLET | Freq: Every day | ORAL | Status: DC

## 2014-01-06 NOTE — ED Notes (Signed)
Per EMS: was at work no strenuous activity.  Got weak, collapsed to floor, no total loc, perfuse diaphoresis, sudden onset n/v, denies any kind of pain.  A&Ox4, ECG afib, lot of demand pacing.  4 years ago had similar event per EMS, took gallbladder out and put pacemaker in.  8 zofran given en route, still vomiting.  Nothing out of the ordinary about color of vomit.

## 2014-01-06 NOTE — ED Provider Notes (Signed)
6:38 PM Pt fell from bed. Sitting on edge of bed and as went to stand to use urinal, feet slid out from under him. Fell onto butt and then hit head on floor. No LOC. Denies ay HA. No neck pain. No midline spinal tenderness. No external signs of head trauma. Pt is on coumadin, but with no acute complaints I do not feel needs head CT.   Virgel Manifold, MD 01/06/14 [phone]

## 2014-01-06 NOTE — Consult Note (Signed)
CARDIOLOGY CONSULT NOTE     Patient ID: Francisco Merritt MRN: [id number] DOB/AGE: 1929-05-06 80 y.o.  Admit date: 01/06/2014 Referring Physician ED physician Primary Physician Nyoka Cowden, MD Primary Cardiologist Cristopher Peru MD Reason for Consultation near syncope  HPI: Francisco Merritt is seen at the request of EDP for evaluation of near syncope.He is a very pleasant 80 year old man with a history of persistent atrial fibrillation, hypertension, symptomatic bradycardia, status post permanent pacemaker insertion. 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 He was lightheaded and diaphoretic and then developed protracted Nausea and vomiting. No abdominal pain. No chest pain. He notes some SOB for the past 2 months but no swelling and weight has been stable. He had a sausage and egg biscuit from [employer] this am. No other family members are sick. No fever, chills. Still feels quite nauseated. No history of CHF.   Past Medical History  Diagnosis Date  . Paroxysmal atrial fibrillation     s/p Guidant Insignia pacemaker; started sotalol 06/2009  . Tachy-brady syndrome   . Coronary artery disease     one vessel CAD with normal EF; Cath 12/2006. Ef normal. LM nl, LAD 40%, D2 occulded with collaterals, LCX 40%, RCA 30%; Echo 02/2009 EF 68-34% grade I diastolic dysfunction.  Marland Kitchen History of gallstones     status post cholecystectomy  . Hypertension   . Hyperlipidemia   . History of peptic ulcer disease   . Osteoarthritis   . Benign prostatic hypertrophy   . Obesity   . History of nephrolithiasis   . Diverticulosis of colon   . Testosterone deficiency   . ED (erectile dysfunction)     Family History  Problem Relation Age of Onset  . Hypertension Mother   . Heart disease Mother   . Diabetes Mother   . Heart attack Father   . Diabetes Brother   . Hypertension  Brother   . Heart disease Brother   . Diabetes Brother   . Hypertension Brother   . Heart disease Brother     History   Social History  . Marital Status: Married    Spouse Name: N/A    Number of Children: N/A  . Years of Education: N/A   Occupational History  . Not on file.   Social History Main Topics  . Smoking status: Never Smoker   . Smokeless tobacco: Never Used  . Alcohol Use: No  . Drug Use: No  . Sexual Activity: Not on file   Other Topics Concern  . Not on file   Social History Narrative  . No narrative on file    Past Surgical History  Procedure Laterality Date  . Cholecystectomy    . Insert / replace / remove pacemaker    . Cardiac catheterization    . Colonoscopy  1999  . Upper gastroduodenoscopy  1999  . Doppler evaluation        (Not in a hospital admission)    Medication List    ASK your doctor about these medications       amLODipine 5 MG tablet  Commonly known as:  NORVASC  Take 5 mg by mouth daily.     aspirin 81 MG tablet  Take 81 mg by mouth daily.     glucosamine-chondroitin 500-400 MG tablet  Take 1 tablet by mouth 2 (two) times daily.  metoprolol tartrate 25 MG tablet  Commonly known as:  LOPRESSOR  Take 1 tablet (25 mg total) by mouth 2 (two) times daily.     multivitamin tablet  Take 1 tablet by mouth daily.     nitroGLYCERIN 0.4 MG SL tablet  Commonly known as:  NITROSTAT  Place 1 tablet (0.4 mg total) under the tongue every 5 (five) minutes as needed.     simvastatin 20 MG tablet  Commonly known as:  ZOCOR  Take 20 mg by mouth at bedtime.     tamsulosin 0.4 MG Caps capsule  Commonly known as:  FLOMAX  Take 0.4 mg by mouth daily after supper.     traMADol 50 MG tablet  Commonly known as:  ULTRAM  Take 50 mg by mouth every 6 (six) hours as needed (for pain).     TYLENOL ARTHRITIS PAIN 650 MG CR tablet  Generic drug:  acetaminophen  Take 650 mg by mouth 2 (two) times daily.     warfarin 5 MG tablet    Commonly known as:  COUMADIN  Take 2.5-5 mg by mouth every morning. Take 2.5 mg daily except 5 mg on sundays         ROS: As noted in HPI. All other systems are reviewed and are negative unless otherwise mentioned.   Physical Exam: Blood pressure 133/92, pulse 125, temperature 97.3 F (36.3 C), temperature source Axillary, resp. rate 16, height 5\' 11"  (1.803 m), weight 215 lb (97.523 kg), SpO2 97.00%. Current Weight  01/06/14 215 lb (97.523 kg)  08/31/13 220 lb (99.791 kg)  03/05/13 215 lb (97.523 kg)    GENERAL:  Elderly WM appears nauseated. Brown vomitus on gown.  HEENT:  PERRL, EOMI, sclera are clear. Oropharynx is clear. NECK:  No jugular venous distention, carotid upstroke brisk and symmetric, no bruits, no thyromegaly or adenopathy LUNGS:  Rales in the right base. CHEST:  Unremarkable HEART:  IRRR,  PMI not displaced or sustained,S1 and S2 within normal limits, no S3, no S4: no clicks, no rubs, no murmurs ABD:  Soft, nontender. BS +, no masses or bruits. No hepatomegaly, no splenomegaly EXT:  2 + pulses throughout, no edema, no cyanosis no clubbing SKIN:  Warm and dry.  No rashes NEURO:  Alert and oriented x 3. Cranial nerves II through XII intact. PSYCH:  Cognitively intact  Telemetry: atrial fibrillation with rate 110. Intermittent V pacing.  Labs:   Lab Results  Component Value Date   WBC 10.1 01/06/2014   HGB 14.6 01/06/2014   HCT 43.8 01/06/2014   MCV 97.1 01/06/2014   PLT 229 01/06/2014    Recent Labs Lab 01/06/14 1305  NA 143  K 4.3  CL 104  CO2 21  BUN 25*  CREATININE 1.38*  CALCIUM 9.7  PROT 7.7  BILITOT 0.6  ALKPHOS 57  ALT 14  AST 24  GLUCOSE 162*   Lab Results  Component Value Date   CKTOTAL 137 07/05/2009   CKTOTAL 53 11/30/2006   CKTOTAL 62 11/29/2006   CKMB 5.6* 07/05/2009   CKMB 2.0 11/30/2006   CKMB 2.5 11/29/2006   TROPONINI <0.30 01/06/2014   TROPONINI  Value: 0.02        NO INDICATION OF MYOCARDIAL INJURY. 07/05/2009   TROPONINI   Value: 0.02        NO INDICATION OF MYOCARDIAL INJURY. 11/30/2006    Lab Results  Component Value Date   CHOL 148 03/05/2013   CHOL 137 02/20/2010   CHOL 139  04/05/2009   Lab Results  Component Value Date   HDL 51.60 03/05/2013   HDL 42.20 02/20/2010   HDL 43.10 04/05/2009   Lab Results  Component Value Date   LDLCALC 73 03/05/2013   LDLCALC 66 02/20/2010   LDLCALC 66 04/05/2009   Lab Results  Component Value Date   TRIG 118.0 03/05/2013   TRIG 143.0 02/20/2010   TRIG 148.0 04/05/2009   Lab Results  Component Value Date   CHOLHDL 3 03/05/2013   CHOLHDL 3 02/20/2010   CHOLHDL 3 04/05/2009   No results found for this basename: LDLDIRECT    Lab Results  Component Value Date   PROBNP 2171.0* 01/06/2014   PROBNP 68.0 07/07/2009   Lab Results  Component Value Date   TSH 0.49 03/05/2013   No results found for this basename: HGBA1C    Radiology: Dg Chest Port 1 View  01/06/2014   CLINICAL DATA:  Shortness of breath.  EXAM: PORTABLE CHEST - 1 VIEW  COMPARISON:  PA and lateral chest 02/13/2007. Single view of the chest 07/05/2009.  FINDINGS: Pacing device remains in place. Lungs are clear. Heart size is upper normal. No pneumothorax or pleural effusion  IMPRESSION: No acute disease.   Electronically Signed   By: Inge Rise M.D.   On: 01/06/2014 13:30    EKG: atrial fibrillation with intermittent V pacing. LAFB. Nonspecific TWA.   ASSESSMENT AND PLAN:  1. Near syncope related to acute vagal event with protracted N/V. I personally reviewed pacemaker interrogation. Pacemaker is functioning well. Occ. High rate episodes but not sustained. Over 90% of the time his rate is less than 100. His HR is a little faster now related to his acute stress.   2. Elevated BNP. Very little evidence of CHF. He does have some right basilar rales but no edema and CXR is clear. I would be reluctant to use any diuretics currently with acute N/V given concern for dehydration. Agree with checking  Echo.   3. Acute Nausea and vomiting. Evaluation per medical team.  4. Permanent atrial fibrillation. Continue metoprolol for rate control. On coumadin for anticoagulation.  5. HTN controlled.  6. CAD with history of diagonal occlusion with collaterals. No angina.     Signed: Sylar Voong [place], [place]  01/06/2014, 5:34 PM

## 2014-01-06 NOTE — ED Notes (Signed)
[employer] called for interrogation.

## 2014-01-06 NOTE — Progress Notes (Signed)
ANTICOAGULATION CONSULT NOTE - Initial Consult  Pharmacy Consult for Coumadin Indication: atrial fibrillation  Allergies  Allergen Reactions  . Sulfonamide Derivatives     REACTION: n/v    Patient Measurements: Height: 5\' 11"  (180.3 cm) Weight: 211 lb 1.6 oz (95.754 kg) IBW/kg (Calculated) : 75.3 Heparin Dosing Weight:    Vital Signs: Temp: 97.6 F (36.4 C) (09/24 2005) Temp src: Oral (09/24 2005) BP: 145/86 mmHg (09/24 2005) Pulse Rate: 96 (09/24 2005)  Labs:  Recent Labs  01/06/14 1305 01/06/14 2115  HGB 14.6  --   HCT 43.8  --   PLT 229  --   LABPROT  --  28.4*  INR  --  2.67*  CREATININE 1.38*  --   TROPONINI <0.30 <0.30    Estimated Creatinine Clearance: 49.6 ml/min (by C-G formula based on Cr of 1.38).   Medical History: Past Medical History  Diagnosis Date  . Paroxysmal atrial fibrillation     s/p Guidant Insignia pacemaker; started sotalol 06/2009  . Tachy-brady syndrome   . Coronary artery disease     one vessel CAD with normal EF; Cath 12/2006. Ef normal. LM nl, LAD 40%, D2 occulded with collaterals, LCX 40%, RCA 30%; Echo 02/2009 EF 37-34% grade I diastolic dysfunction.  Marland Kitchen History of gallstones     status post cholecystectomy  . Hypertension   . Hyperlipidemia   . History of peptic ulcer disease   . Osteoarthritis   . Benign prostatic hypertrophy   . Obesity   . History of nephrolithiasis   . Diverticulosis of colon   . Testosterone deficiency   . ED (erectile dysfunction)     Assessment: weakness, nausea, emesis, near syncope  80 y/o M presents with near-syncope. BNP elevated 2171. Plan to check Echo. Patient also has h/o afib and is on chronic Coumadin (2.5mg /day except 5mg  on Sundays). INR 2.67 on admit. CBC WNL. Troponin negative x 1 thus far.   Goal of Therapy:  INR 2-3 Monitor platelets by anticoagulation protocol: Yes  Plan:   Continue home Coumadin 2.5mg  daily except 5mg  on Sunday. Daily INR.  Jaima Janney S. Alford Highland, PharmD,  BCPS Clinical Staff Pharmacist Pager [phone]  Eilene Ghazi Stillinger 01/06/2014,10:53 PM

## 2014-01-06 NOTE — H&P (Signed)
[facility] History and Physical  Kaliel Bolds XBM:[id number] DOB: May 15, 1929 DOA: 01/06/2014  Referring physician: EDP PCP: Nyoka Cowden, MD   Chief Complaint: almost blacked out  HPI: Francisco Merritt is a 80 y.o. male with PMH of Single vessel CAD, P.Afib on coumadin and s/p pacemaker, BPH, h/o grade 1 diastolic dysfunction was in his usual state of health till this am. He was out shopping and around 11:30am in the Store he suddenly felt lightheaded and faint and was about to fall when he was supported by 2 bystanders. He was then seated on a stool nearby and subsequently started feeling queasy and vomited multiple times. His vomitus was non bloody or bilious. Last BM yesterday, no melena He denies any abd pain, no chest pain, or palpitations, no fevers or chills. EMS was called and he was brought to the ER where he was mildly hypoxic, labs unremarkable except BNP 2171, his pacer was interrogated no arrhythmias noted at time of event, occasional Afib with RVR noted. He does report mild dyspnea on exertion for the last week. Cardiology was consulted per EDP, who will consult and recommended admission per [facility]    Review of Systems: positives bolded Constitutional:  No weight loss, night sweats, Fevers, chills, fatigue.  HEENT:  No headaches, Difficulty swallowing,Tooth/dental problems,Sore throat,  No sneezing, itching, ear ache, nasal congestion, post nasal drip,  Cardio-vascular:  No chest pain, Orthopnea, PND, swelling in lower extremities, anasarca, dizziness, palpitations  GI:  No heartburn, indigestion, abdominal pain, nausea, vomiting, diarrhea, change in bowel habits, loss of appetite  Resp:   shortness of breath with exertion or at rest. No excess mucus, no productive cough, No non-productive cough, No coughing up of blood.No change in color of mucus.No wheezing.No chest wall deformity  Skin:  no rash or lesions.  GU:  no dysuria, change in color of  urine, no urgency or frequency. No flank pain.  Musculoskeletal:  No joint pain or swelling. No decreased range of motion. No back pain.  Psych:  No change in mood or affect. No depression or anxiety. No memory loss.   Past Medical History  Diagnosis Date  . Paroxysmal atrial fibrillation     s/p Guidant Insignia pacemaker; started sotalol 06/2009  . Tachy-brady syndrome   . Coronary artery disease     one vessel CAD with normal EF; Cath 12/2006. Ef normal. LM nl, LAD 40%, D2 occulded with collaterals, LCX 40%, RCA 30%; Echo 02/2009 EF 02-72% grade I diastolic dysfunction.  Marland Kitchen History of gallstones     status post cholecystectomy  . Hypertension   . Hyperlipidemia   . History of peptic ulcer disease   . Osteoarthritis   . Benign prostatic hypertrophy   . Obesity   . History of nephrolithiasis   . Diverticulosis of colon   . Testosterone deficiency   . ED (erectile dysfunction)    Past Surgical History  Procedure Laterality Date  . Cholecystectomy    . Insert / replace / remove pacemaker    . Cardiac catheterization    . Colonoscopy  1999  . Upper gastroduodenoscopy  1999  . Doppler evaluation     Social History:  reports that he has never smoked. He has never used smokeless tobacco. He reports that he does not drink alcohol or use illicit drugs.  Allergies  Allergen Reactions  . Sulfonamide Derivatives     REACTION: n/v    Family History  Problem Relation Age of Onset  . Hypertension Mother   .  Heart disease Mother   . Diabetes Mother   . Heart attack Father   . Diabetes Brother   . Hypertension Brother   . Heart disease Brother   . Diabetes Brother   . Hypertension Brother   . Heart disease Brother      Prior to Admission medications   Medication Sig Start Date End Date Taking? Authorizing Provider  acetaminophen (TYLENOL ARTHRITIS PAIN) 650 MG CR tablet Take 650 mg by mouth 2 (two) times daily.    Yes Historical Provider, MD  amLODipine (NORVASC) 5 MG tablet  Take 5 mg by mouth daily.   Yes Historical Provider, MD  aspirin 81 MG tablet Take 81 mg by mouth daily.     Yes Historical Provider, MD  glucosamine-chondroitin 500-400 MG tablet Take 1 tablet by mouth 2 (two) times daily.     Yes Historical Provider, MD  metoprolol tartrate (LOPRESSOR) 25 MG tablet Take 1 tablet (25 mg total) by mouth 2 (two) times daily. 01/28/13  Yes Evans Lance, MD  Multiple Vitamin (MULTIVITAMIN) tablet Take 1 tablet by mouth daily.     Yes Historical Provider, MD  nitroGLYCERIN (NITROSTAT) 0.4 MG SL tablet Place 1 tablet (0.4 mg total) under the tongue every 5 (five) minutes as needed. 08/28/10  Yes Marletta Lor, MD  simvastatin (ZOCOR) 20 MG tablet Take 20 mg by mouth at bedtime.   Yes Historical Provider, MD  tamsulosin (FLOMAX) 0.4 MG CAPS capsule Take 0.4 mg by mouth daily after supper.   Yes Historical Provider, MD  traMADol (ULTRAM) 50 MG tablet Take 50 mg by mouth every 6 (six) hours as needed (for pain).   Yes Historical Provider, MD  warfarin (COUMADIN) 5 MG tablet Take 2.5-5 mg by mouth every morning. Take 2.5 mg daily except 5 mg on sundays   Yes Historical Provider, MD   Physical Exam: Filed Vitals:   01/06/14 1515 01/06/14 1530 01/06/14 1545 01/06/14 1600  BP: 138/72 132/69 135/74 130/74  Pulse: 32 89 98 99  Temp:      TempSrc:      Resp: 33 20 25 20   Height:      Weight:      SpO2: 95% 95% 97% 96%    Wt Readings from Last 3 Encounters:  01/06/14 97.523 kg (215 lb)  08/31/13 99.791 kg (220 lb)  03/05/13 97.523 kg (215 lb)    General:  Appears calm and comfortable, weak appearing Eyes: PERRL, normal lids, irises & conjunctiva ENT: grossly normal  lips & tongue Neck: no LAD, masses or thyromegaly Cardiovascular: Irregular rate and rhythm, no m/r/g. No LE edema. Respiratory: CTA bilaterally, no w/r/r. Normal respiratory effort. Abdomen: soft, ntnd Skin: no rash or induration seen on limited exam Musculoskeletal: grossly normal tone  BUE/BLE, trace edema  Psychiatric: grossly normal mood and affect, speech fluent and appropriate Neurologic: grossly non-focal.          Labs on Admission:  Basic Metabolic Panel:  Recent Labs Lab 01/06/14 1305  NA 143  K 4.3  CL 104  CO2 21  GLUCOSE 162*  BUN 25*  CREATININE 1.38*  CALCIUM 9.7   Liver Function Tests:  Recent Labs Lab 01/06/14 1305  AST 24  ALT 14  ALKPHOS 57  BILITOT 0.6  PROT 7.7  ALBUMIN 3.9    Recent Labs Lab 01/06/14 1305  LIPASE 31   No results found for this basename: AMMONIA,  in the last 168 hours CBC:  Recent Labs Lab 01/06/14 1305  WBC 10.1  NEUTROABS 7.5  HGB 14.6  HCT 43.8  MCV 97.1  PLT 229   Cardiac Enzymes:  Recent Labs Lab 01/06/14 1305  TROPONINI <0.30    BNP (last 3 results)  Recent Labs  01/06/14 1305  PROBNP 2171.0*   CBG: No results found for this basename: GLUCAP,  in the last 168 hours  Radiological Exams on Admission: Dg Chest Port 1 View  01/06/2014   CLINICAL DATA:  Shortness of breath.  EXAM: PORTABLE CHEST - 1 VIEW  COMPARISON:  PA and lateral chest 02/13/2007. Single view of the chest 07/05/2009.  FINDINGS: Pacing device remains in place. Lungs are clear. Heart size is upper normal. No pneumothorax or pleural effusion  IMPRESSION: No acute disease.   Electronically Signed   By: Inge Rise M.D.   On: 01/06/2014 13:30    EKG: Independently reviewed. Paced, Afib, LAFB, QTC 500  Assessment/Plan    Near syncope -etiology unclear, ? Vaso-vagal event -no arrhythmias corresponding to time of event based on Pacer interrogation, but some Afib with RVR noted -check orthostatics, hold Flomax for now -check Troponin/ECHO -monitor on Tele -Cards consulted per EDP    HYPERTENSION -continue amlodipine, metoprolol    Atrial fibrillation -rate controlled, continue Metoprolol, coumadin    Acute on Chronic diastolic heart failure -trace edema with elevated BNP -not overtly in CHF, will  await Cards input and Orthostatic vitals -FU ECHO    BENIGN PROSTATIC HYPERTROPHY -hold flomax, FU Orthostatics    H/o Symptomatic bradycardia, Tachy/brady syndrome s/p pacer  -s/p Pacer interrogation in ER -continue Metoprolol      CKD 3 -stable at baseline  Code Status: Full COde DVT Prophylaxis: on warfarin Family Communication:d/w son and daughter at bedside Disposition Plan: admit to tele  Time spent: 29min  Annica Marinello [facility] Pager [phone]

## 2014-01-06 NOTE — ED Notes (Signed)
Placed pt back on 2L nasal cannula.

## 2014-01-06 NOTE — ED Notes (Signed)
Removed from nasal cannula by Dr Doy Mince to observe RA SpO2.

## 2014-01-06 NOTE — ED Provider Notes (Signed)
CSN: [id number]     Arrival date & time 01/06/14  1233 History   First MD Initiated Contact with Patient 01/06/14 1234     Chief Complaint  Patient presents with  . Weakness  . Nausea  . Emesis     (Consider location/radiation/quality/duration/timing/severity/associated sxs/prior Treatment) HPI Comments: 80 year old male who presents with near-syncope while at a sporting goods store. Bystanders caught him and helped him to a seated position. No injuries sustained. Patient has had persistent nausea and vomiting since the episode.  Patient is a 80 y.o. male presenting with near-syncope.  Near Syncope This is a new problem. The current episode started less than 1 hour ago. Episode frequency: Once. The problem has been resolved. Associated symptoms include shortness of breath (mild for the past few weeks). Pertinent negatives include no chest pain and no abdominal pain. Associated symptoms comments: Nausea and vomiting. No diarrhea. No fevers.. Nothing aggravates the symptoms. The symptoms are relieved by rest.    Past Medical History  Diagnosis Date  . Paroxysmal atrial fibrillation     s/p Guidant Insignia pacemaker; started sotalol 06/2009  . Tachy-brady syndrome   . Coronary artery disease     one vessel CAD with normal EF; Cath 12/2006. Ef normal. LM nl, LAD 40%, D2 occulded with collaterals, LCX 40%, RCA 30%; Echo 02/2009 EF 62-22% grade I diastolic dysfunction.  Marland Kitchen History of gallstones     status post cholecystectomy  . Hypertension   . Hyperlipidemia   . History of peptic ulcer disease   . Osteoarthritis   . Benign prostatic hypertrophy   . Obesity   . History of nephrolithiasis   . Diverticulosis of colon   . Testosterone deficiency   . ED (erectile dysfunction)    Past Surgical History  Procedure Laterality Date  . Cholecystectomy    . Insert / replace / remove pacemaker    . Cardiac catheterization    . Colonoscopy  1999  . Upper gastroduodenoscopy  1999  . Doppler  evaluation     Family History  Problem Relation Age of Onset  . Hypertension Mother   . Heart disease Mother   . Diabetes Mother   . Heart attack Father   . Diabetes Brother   . Hypertension Brother   . Heart disease Brother   . Diabetes Brother   . Hypertension Brother   . Heart disease Brother    History  Substance Use Topics  . Smoking status: Never Smoker   . Smokeless tobacco: Never Used  . Alcohol Use: No    Review of Systems  Respiratory: Positive for shortness of breath (mild for the past few weeks).   Cardiovascular: Positive for near-syncope. Negative for chest pain.  Gastrointestinal: Negative for abdominal pain.  All other systems reviewed and are negative.     Allergies  Sulfonamide derivatives  Home Medications   Prior to Admission medications   Medication Sig Start Date End Date Taking? Authorizing Provider  acetaminophen (TYLENOL ARTHRITIS PAIN) 650 MG CR tablet Take 650 mg by mouth as needed.     Historical Provider, MD  amLODipine (NORVASC) 5 MG tablet TAKE 1 TABLET BY MOUTH EVERY DAY 05/17/13   Marletta Lor, MD  Ascorbic Acid (VITAMIN C) 500 MG tablet Take 500 mg by mouth daily.      Historical Provider, MD  aspirin 81 MG tablet Take 81 mg by mouth daily.      Historical Provider, MD  glucosamine-chondroitin 500-400 MG tablet Take 1 tablet by  mouth 2 (two) times daily.      Historical Provider, MD  metoprolol tartrate (LOPRESSOR) 25 MG tablet Take 1 tablet (25 mg total) by mouth 2 (two) times daily. 01/28/13   Evans Lance, MD  Multiple Vitamin (MULTIVITAMIN) tablet Take 1 tablet by mouth daily.      Historical Provider, MD  nitroGLYCERIN (NITROSTAT) 0.4 MG SL tablet Place 1 tablet (0.4 mg total) under the tongue every 5 (five) minutes as needed. 08/28/10   Marletta Lor, MD  simvastatin (ZOCOR) 20 MG tablet TAKE 1 TABLET BY MOUTH AT BEDTIME 02/07/13   Marletta Lor, MD  tamsulosin [facility]) 0.4 MG CAPS capsule TAKE ONE CAPSULE BY  MOUTH DAILY AFTER SUPPER 12/06/13   Marletta Lor, MD  traMADol (ULTRAM) 50 MG tablet Take 1 tablet (50 mg total) by mouth every 6 (six) hours as needed. 08/31/13   Marletta Lor, MD  warfarin (COUMADIN) 5 MG tablet Take as directed by anticoagulation clinic 06/25/13   Marletta Lor, MD   BP 112/75  Pulse 97  Temp(Src) 97.3 F (36.3 C) (Axillary)  Resp 22  Ht 5\' 11"  (1.803 m)  Wt 215 lb (97.523 kg)  BMI 30.00 kg/m2  SpO2 97% Physical Exam  Nursing note and vitals reviewed. Constitutional: He is oriented to person, place, and time. He appears well-developed and well-nourished. He appears ill. No distress.  HENT:  Head: Normocephalic and atraumatic.  Mouth/Throat: Oropharynx is clear and moist.  Eyes: Conjunctivae are normal. Pupils are equal, round, and reactive to light. No scleral icterus.  Neck: Neck supple.  Cardiovascular: Normal rate, normal heart sounds and intact distal pulses.  An irregularly irregular rhythm present.  No murmur heard. Pulmonary/Chest: Effort normal. No stridor. No respiratory distress. He has no wheezes. He has rales (bibasilar right greater than left).  Abdominal: Soft. He exhibits no distension. There is no tenderness.  Musculoskeletal: Normal range of motion. He exhibits no edema.  Neurological: He is alert and oriented to person, place, and time.  Skin: Skin is warm and dry. No rash noted.  Psychiatric: He has a normal mood and affect. His behavior is normal.    ED Course  Procedures (including critical care time) Labs Review Labs Reviewed - No data to display  Imaging Review No results found.   EKG Interpretation   Date/Time:  Thursday January 06 2014 12:43:20 EDT Ventricular Rate:  107 PR Interval:    QRS Duration: 93 QT Interval:  375 QTC Calculation: 500 R Axis:   -41 Text Interpretation:  Atrial fibrillation Left anterior fascicular block  Abnormal R-wave progression, late transition Borderline prolonged QT   interval Baseline wander in lead(s) V1 compared to prior, now with a fib  Confirmed by [facility]  MD, TREY ([id number]) on 01/06/2014 2:56:19 PM      MDM   Final diagnoses:  Chronic atrial fibrillation  Cardiac pacemaker in situ  Atherosclerosis of native coronary artery of native heart without angina pectoris  Near syncope  Nausea and vomiting, vomiting of unspecified type    80 year old male with a history of A. fib and coronary disease who presents after a near syncopal episode. He has had persistent nausea and vomiting since then. No abdominal pain and abdomen is soft and nontender.  Cardiology consulted and internal medicine have admitted.   Houston Siren III, MD 01/07/14 [phone]

## 2014-01-06 NOTE — ED Notes (Signed)
Pt was symptomatic of orthostatsis from sitting to standing.  I assisted with support of standing position.

## 2014-01-06 NOTE — ED Notes (Signed)
[employer] at bedside to interrogate.

## 2014-01-06 NOTE — Progress Notes (Signed)
UR completed Alicia Seib K. Duff Pozzi, RN, BSN, [place], CCM  01/06/2014 7:33 PM

## 2014-01-06 NOTE — Care Management Note (Addendum)
  Page 2 of 2   01/07/2014     4:51:34 PM CARE MANAGEMENT NOTE 01/07/2014  Patient:  [facility]   Account Number:  [id number]  Date Initiated:  01/06/2014  Documentation initiated by:  Britni Driscoll  Subjective/Objective Assessment:   almost blacked out, mild hypoxia in ED, N&V     Action/Plan:   CM to follow for dispostion needs   Anticipated DC Date:  01/09/2014   Anticipated DC Plan:  [place]  CM consult      [place]   Choice offered to / List presented to:  C-1 Patient   DME arranged  [place]      DME agency  [place] arranged  HH-1 RN  [place] PT      [place].   Status of service:  Completed, signed off Medicare Important Message given?  YES (If response is "NO", the following Medicare IM given date fields will be blank) Date Medicare IM given:  01/07/2014 Medicare IM given by:  Keir Foland Date Additional Medicare IM given:   Additional Medicare IM given by:    Discharge Disposition:  [place]  Per UR Regulation:  Reviewed for med. necessity/level of care/duration of stay  If discussed at [place] of Stay Meetings, dates discussed:    Comments:  Benjerman Molinelli RN, BSN, MSHL, CCM  Nurse - Case Manager,  (Unit [place])  [phone]  01/07/2014 TC call from [facility] out of network with [facility] DME RW with seat requested by patient  and be delivered to patients home. new order sent to [facility] [address], [place], [place] [address] [phone] phone [phone] fax Contact: Pam  CM confirmed [facility] is in network with [employer] for HHS but not DME   Kameelah Minish RN, BSN, MSHL, CCM  Nurse - Case Manager,  (Unit [place])  [phone]  01/07/2014 Social:  From home with DTR Hx/o fall since admission PT  Recs:  PT DME:  RECS:  Rolling walker with 5" wheels (Pt states daughter has walker, pt needs his own) ([facility]/[place] notified) Dispostion Plan:  HHS:  RN, PT - Fall Prevention  ([place] / Butch Penny Notified)

## 2014-01-07 DIAGNOSIS — I4891 Unspecified atrial fibrillation: Principal | ICD-10-CM

## 2014-01-07 DIAGNOSIS — I10 Essential (primary) hypertension: Secondary | ICD-10-CM

## 2014-01-07 DIAGNOSIS — I5032 Chronic diastolic (congestive) heart failure: Secondary | ICD-10-CM

## 2014-01-07 DIAGNOSIS — R112 Nausea with vomiting, unspecified: Secondary | ICD-10-CM

## 2014-01-07 DIAGNOSIS — R55 Syncope and collapse: Secondary | ICD-10-CM

## 2014-01-07 DIAGNOSIS — Z950 Presence of cardiac pacemaker: Secondary | ICD-10-CM

## 2014-01-07 DIAGNOSIS — I359 Nonrheumatic aortic valve disorder, unspecified: Secondary | ICD-10-CM

## 2014-01-07 LAB — COMPREHENSIVE METABOLIC PANEL
ALK PHOS: 53 U/L (ref 39–117)
ALT: 13 U/L (ref 0–53)
AST: 24 U/L (ref 0–37)
Albumin: 3.6 g/dL (ref 3.5–5.2)
Anion gap: 14 (ref 5–15)
BUN: 21 mg/dL (ref 6–23)
CO2: 22 meq/L (ref 19–32)
Calcium: 9.2 mg/dL (ref 8.4–10.5)
Chloride: 105 mEq/L (ref 96–112)
Creatinine, Ser: 1.27 mg/dL (ref 0.50–1.35)
GFR calc Af Amer: 59 mL/min — ABNORMAL LOW (ref >90)
GFR, EST NON AFRICAN AMERICAN: 51 mL/min — AB (ref >90)
GLUCOSE: 117 mg/dL — AB (ref 70–99)
POTASSIUM: 4.8 meq/L (ref 3.7–5.3)
SODIUM: 141 meq/L (ref 137–147)
Total Bilirubin: 0.5 mg/dL (ref 0.3–1.2)
Total Protein: 7.2 g/dL (ref 6.0–8.3)

## 2014-01-07 LAB — CBC
HCT: 40.2 % (ref 39.0–52.0)
HEMOGLOBIN: 13.5 g/dL (ref 13.0–17.0)
MCH: 32.5 pg (ref 26.0–34.0)
MCHC: 33.6 g/dL (ref 30.0–36.0)
MCV: 96.6 fL (ref 78.0–100.0)
Platelets: 226 10*3/uL (ref 150–400)
RBC: 4.16 MIL/uL — ABNORMAL LOW (ref 4.22–5.81)
RDW: 13.3 % (ref 11.5–15.5)
WBC: 9.8 10*3/uL (ref 4.0–10.5)

## 2014-01-07 LAB — TROPONIN I

## 2014-01-07 LAB — PROTIME-INR
INR: 2.85 — ABNORMAL HIGH (ref 0.00–1.49)
Prothrombin Time: 29.9 seconds — ABNORMAL HIGH (ref 11.6–15.2)

## 2014-01-07 MED ORDER — METOPROLOL TARTRATE 25 MG PO TABS
25.0000 mg | ORAL_TABLET | Freq: Three times a day (TID) | ORAL | Status: DC
  Administered 2014-01-07 – 2014-01-08 (×5): 25 mg via ORAL
  Filled 2014-01-07 (×8): qty 1

## 2014-01-07 MED ORDER — PANTOPRAZOLE SODIUM 40 MG PO TBEC
40.0000 mg | DELAYED_RELEASE_TABLET | Freq: Every day | ORAL | Status: DC
  Administered 2014-01-07 – 2014-01-08 (×2): 40 mg via ORAL
  Filled 2014-01-07 (×2): qty 1

## 2014-01-07 MED ORDER — SODIUM CHLORIDE 0.9 % IV SOLN
INTRAVENOUS | Status: AC
  Administered 2014-01-07 – 2014-01-08 (×2): via INTRAVENOUS

## 2014-01-07 NOTE — Progress Notes (Signed)
ANTICOAGULATION CONSULT NOTE - follow up Pharmacy Consult for Coumadin Indication: atrial fibrillation  Allergies  Allergen Reactions  . Sulfonamide Derivatives     REACTION: n/v    Patient Measurements: Height: 5\' 11"  (180.3 cm) Weight: 208 lb 3.2 oz (94.439 kg) IBW/kg (Calculated) : 75.3   Vital Signs: Temp: 97.3 F (36.3 C) (09/25 1013) Temp src: Oral (09/25 1013) BP: 117/83 mmHg (09/25 1013) Pulse Rate: 102 (09/25 1013)  Labs:  Recent Labs  01/06/14 1305 01/06/14 2115 01/07/14 0155 01/07/14 0156 01/07/14 1020  HGB 14.6  --  13.5  --   --   HCT 43.8  --  40.2  --   --   PLT 229  --  226  --   --   LABPROT  --  28.4* 29.9*  --   --   INR  --  2.67* 2.85*  --   --   CREATININE 1.38*  --  1.27  --   --   TROPONINI <0.30 <0.30  --  <0.30 <0.30    Estimated Creatinine Clearance: 53.5 ml/min (by C-G formula based on Cr of 1.27).    Assessment:   80 y/o M presents with near-syncope.  Patient also has h/o afib and is on chronic Coumadin (2.5mg /day except 5mg  on Sundays). INR 2.67 on admit (took dose at home 9/24) and remains therapeutic today at 2.85. CBC WNL. No bleeding reported.   Goal of Therapy:  INR 2-3 Monitor platelets by anticoagulation protocol: Yes  Plan:  Continue home Coumadin 2.5mg  daily except 5mg  on Sunday. Daily INR. Eudelia Bunch, Pharm.D. [phone] 01/07/2014 2:22 PM

## 2014-01-07 NOTE — Progress Notes (Signed)
While working with PT pt. Had another Syncopy episode and heartrate was in the 150's as reported to me by PT. Pt. B/P was 130's /105. Provider/MD on call was on unit, I advised MD of pt. Condition and of the syncopy episode. MD understood and went in to reassess pt. Upon entering room pt. Was stable and heartrate was back to baseline. Will carryout new orders once put in by MD.

## 2014-01-07 NOTE — Progress Notes (Signed)
Patient evaluated for community based chronic disease management services with [facility] as a benefit of patient's [employer]. Spoke with patient and wife at bedside to reintroduce [place] [facility] services.  Services have again been accepted with verbal consent from patient.  His wife will continue to be his authorized contact.  Discharge focus is anticipated to be on continued medication monitoring for his dizziness and syncope.  Patient will receive a post discharge transition of care call and will be evaluated for monthly home visits for assessments and disease process education.  Left contact information and [facility] literature at bedside. Made Inpatient Case Manager aware that [place] [facility] following. Of note, [facility] Care Management services does not replace or interfere with any services that are arranged by inpatient case management or social work.  For additional questions or referrals please contact Corliss Blacker BSN RN [facility] Liaison at [phone].

## 2014-01-07 NOTE — Progress Notes (Signed)
[facility] PROGRESS NOTE  Francisco Merritt WFU:[id number] DOB: 12/09/1929 DOA: 01/06/2014 PCP: Nyoka Cowden, MD  Assessment/Plan: Near syncope  -etiology unclear, ? Vaso-vagal event vs mild dehydration/volume contraction with N/V prior to admission. -patient also with some intermittent Afib with RVR noted; metoprolol increased to TID -continue telemetry eval -gentle IVF's for 24 hours -follow 2-D echo results amlodipine and flomax discontinued -check orthostatics again in am  HYPERTENSION  -continue metoprolol TID -due to orthostatic changes will d/c norvasc  Atrial fibrillation  -continue Metoprolol (at higher dose as recommended by cardiology), coumadin   Chronic diastolic heart failure  -trace edema with elevated BNP; but no JVD, no findings of vascular congestion on CXR -Follow ECHO  -no diuretics at this moment  BENIGN PROSTATIC HYPERTROPHY  -continue holding hold flomax -patient still orthostatic -no complaints of urinary retention   H/o Symptomatic bradycardia, Tachy/brady syndrome s/p pacer  -s/p Pacer interrogation in ER and appears to be ok -cardiology on board; will follow rec's -continue Metoprolol; now increased to TID  CKD 3  -stable at baseline   Code Status: Full Family Communication: wife and sister at bedside Disposition Plan: to be determine   Consultants:  Cardiology   Procedures:  2-D echo: pending  Antibiotics:  None   HPI/Subjective: No further nausea or vomiting; denies CP and orthopnea. Still dizzy/lightheaded when standing.  Objective: Filed Vitals:   01/07/14 1013  BP: 117/83  Pulse: 102  Temp: 97.3 F (36.3 C)  Resp:     Intake/Output Summary (Last 24 hours) at 01/07/14 1235 Last data filed at 01/07/14 1003  Gross per 24 hour  Intake    480 ml  Output    525 ml  Net    -45 ml   Filed Weights   01/06/14 1245 01/06/14 2005 01/07/14 0535  Weight: 97.523 kg (215 lb) 95.754 kg (211 lb 1.6 oz) 94.439  kg (208 lb 3.2 oz)    Exam:   General:  AAOX3; denies further nausea or vomiting; still dizzy and with near syncope while working with PT  Cardiovascular: irregular, no rubs or gallops; no JVD appreciated  Respiratory: no wheezing, no frank crackles; good air movement  Abdomen: soft, NT, ND, positive BS  Musculoskeletal: no cyanosis; trace edema bilaterally  Data Reviewed: Basic Metabolic Panel:  Recent Labs Lab 01/06/14 1305 01/07/14 0155  NA 143 141  K 4.3 4.8  CL 104 105  CO2 21 22  GLUCOSE 162* 117*  BUN 25* 21  CREATININE 1.38* 1.27  CALCIUM 9.7 9.2   Liver Function Tests:  Recent Labs Lab 01/06/14 1305 01/07/14 0155  AST 24 24  ALT 14 13  ALKPHOS 57 53  BILITOT 0.6 0.5  PROT 7.7 7.2  ALBUMIN 3.9 3.6    Recent Labs Lab 01/06/14 1305  LIPASE 31   CBC:  Recent Labs Lab 01/06/14 1305 01/07/14 0155  WBC 10.1 9.8  NEUTROABS 7.5  --   HGB 14.6 13.5  HCT 43.8 40.2  MCV 97.1 96.6  PLT 229 226   Cardiac Enzymes:  Recent Labs Lab 01/06/14 1305 01/06/14 2115 01/07/14 0156 01/07/14 1020  TROPONINI <0.30 <0.30 <0.30 <0.30   BNP (last 3 results)  Recent Labs  01/06/14 1305  PROBNP 2171.0*    Studies: Dg Chest Port 1 View  01/06/2014   CLINICAL DATA:  Shortness of breath.  EXAM: PORTABLE CHEST - 1 VIEW  COMPARISON:  PA and lateral chest 02/13/2007. Single view of the chest 07/05/2009.  FINDINGS: Pacing device remains in  place. Lungs are clear. Heart size is upper normal. No pneumothorax or pleural effusion  IMPRESSION: No acute disease.   Electronically Signed   By: Inge Rise M.D.   On: 01/06/2014 13:30    Scheduled Meds: . aspirin  81 mg Oral Daily  . metoprolol tartrate  25 mg Oral TID  . pantoprazole  40 mg Oral QHS  . simvastatin  20 mg Oral QHS  . warfarin  2.5 mg Oral STAT  . warfarin  2.5 mg Oral Once per day on Mon Tue Wed Thu Fri Sat  . [START ON 01/09/2014] warfarin  5 mg Oral Q Sun-1800  . Warfarin - Pharmacist  Dosing Inpatient   Does not apply q1800   Continuous Infusions: . sodium chloride Stopped (01/07/14 1200)    Principal Problem:   Near syncope Active Problems:   HYPERTENSION   CORONARY ARTERY DISEASE   Atrial fibrillation   Chronic diastolic heart failure   BENIGN PROSTATIC HYPERTROPHY   PPM-[employer]   Acute exacerbation of CHF (congestive heart failure)   Syncope, near    Time spent: >30 minutes    Barton Dubois  [facility] Pager [phone]. If 7PM-7AM, please contact night-coverage at [email or web address], password [facility] 01/07/2014, 12:35 PM  LOS: 1 day

## 2014-01-07 NOTE — Progress Notes (Signed)
1600-1900 shift. Pt.is A/Ox4 is one to two person assist. He had no c/o pain and no signs of distress. Bed alarm is on for safety patient has history of multiple falls.

## 2014-01-07 NOTE — Evaluation (Signed)
Occupational Therapy Evaluation Patient Details Name: Stefano Trulson MRN: [id number] DOB: 1929/10/12 Today's Date: 01/07/2014    History of Present Illness Pt is an 80 y.o. male with PMH of Single vessel CAD, P.Afib on coumadin and s/p pacemaker, BPH, h/o grade 1 diastolic dysfunction was in his usual state of health till morning of admission. He was out shopping and around 11:30am in the store he suddenly felt lightheaded and faint and was about to fall when he was supported by 2 bystanders. He was then seated on a stool nearby and subsequently started feeling queasy and vomited multiple times.   Clinical Impression   Limited eval to EOB due to pt with syncopal episode earlier with PT.  Pt's home is well equipped as he has a daughter who requires a w/c and he has good family support.  Expect pt will progress steadily as medical issues resolve.  Will follow acutely.     Follow Up Recommendations  No OT follow up;Supervision/Assistance - 24 hour    Equipment Recommendations  None recommended by OT    Recommendations for Other Services       Precautions / Restrictions Precautions Precautions: Fall Precaution Comments: Pt has already had one fall in the ED, near fall with PT during eval.  Restrictions Weight Bearing Restrictions: No      Mobility Bed Mobility Overal bed mobility: Needs Assistance Bed Mobility: Supine to Sit;Sit to Supine     Supine to sit: Supervision Sit to supine: Supervision   General bed mobility comments: advised to slow pace  Transfers          General transfer comment: Not assessed due to syncopal episode with PT earlier.    Balance Overall balance assessment: History of Falls;Needs assistance Sitting-balance support: Feet supported;No upper extremity supported Sitting balance-Leahy Scale: Fair                                   ADL Overall ADL's : Needs assistance/impaired Eating/Feeding: Independent;Sitting   Grooming:  Wash/dry hands;Wash/dry face;Oral care;Sitting;Supervision/safety   Upper Body Bathing: Sitting;Supervision/ safety   Lower Body Bathing: Supervison/ safety;Sitting/lateral leans   Upper Body Dressing : Supervision/safety;Sitting   Lower Body Dressing: Supervision/safety;Sitting/lateral leans Lower Body Dressing Details (indicate cue type and reason): able to cross his foot over opposite knee for donning socks               General ADL Comments: Limited evaluation due to earlier syncopal episode, performed at EOB. Pt typically gets down in tub to bathe.       Vision                     Perception     Praxis      Pertinent Vitals/Pain Pain Assessment: No/denies pain     Hand Dominance Right   Extremity/Trunk Assessment Upper Extremity Assessment Upper Extremity Assessment: Overall WFL for tasks assessed   Lower Extremity Assessment Lower Extremity Assessment: Defer to PT evaluation   Cervical / Trunk Assessment Cervical / Trunk Assessment: Normal   Communication Communication Communication: No difficulties   Cognition Arousal/Alertness: Awake/alert Behavior During Therapy: WFL for tasks assessed/performed Overall Cognitive Status: Within Functional Limits for tasks assessed                     General Comments       Exercises       Shoulder Instructions  Home Living Family/patient expects to be discharged to:: Private residence Living Arrangements: Spouse/significant other;Children Available Help at Discharge: Family;Available 24 hours/day Type of Home: House Home Access: Ramped entrance     Home Layout: Two level;Able to live on main level with bedroom/bathroom     Bathroom Shower/Tub: Walk-in shower;Tub/shower unit   Bathroom Toilet: Handicapped height Bathroom Accessibility: Yes How Accessible: Accessible via wheelchair;Accessible via walker Home Equipment: [place] - 2 wheels;Cane - single point;Electric scooter;Shower  seat - built in;Grab bars - toilet;Grab bars - tub/shower;Hand held shower head;Bedside commode   Additional Comments: Pt states his worry is that his wife will not be able to care for their handicapped daughter on her own. Pt is doing a lot of care for the daughter.       Prior Functioning/Environment Level of Independence: Independent        Comments: Still driving, goes with wife for grocery shopping, reports increasing fatigue with ambulation, was considering rollator    OT Diagnosis: Generalized weakness   OT Problem List: Decreased activity tolerance;Impaired balance (sitting and/or standing);Decreased knowledge of use of DME or AE   OT Treatment/Interventions: Self-care/ADL training;DME and/or AE instruction;Patient/family education;Therapeutic activities    OT Goals(Current goals can be found in the care plan section) Acute Rehab OT Goals Patient Stated Goal: To be able to continue caring for his handicapped daughter at home.  OT Goal Formulation: With patient Time For Goal Achievement: 01/21/14 Potential to Achieve Goals: Good ADL Goals Pt Will Perform Grooming: with modified independence;standing Pt Will Perform Lower Body Bathing: with modified independence;sit to/from stand Pt Will Perform Lower Body Dressing: with modified independence;sit to/from stand Pt Will Transfer to Toilet: with modified independence;ambulating;grab bars Pt Will Perform Toileting - Clothing Manipulation and hygiene: with modified independence;sit to/from stand Pt Will Perform Tub/Shower Transfer: Tub transfer;with modified independence;ambulating;grab bars  OT Frequency: Min 2X/week   Barriers to D/C:            Co-evaluation              End of Session    Activity Tolerance: Patient tolerated treatment well Patient left: in bed;with call bell/phone within reach;with family/visitor present;with nursing/sitter in room   Time: 1140-1203 OT Time Calculation (min): 23 min Charges:   OT General Charges $OT Visit: 1 Procedure OT Evaluation $Initial OT Evaluation Tier I: 1 Procedure OT Treatments $Self Care/Home Management : 8-22 mins G-Codes:    Malka So 01/07/2014, 12:05 PM [phone]

## 2014-01-07 NOTE — Progress Notes (Signed)
TELEMETRY: Reviewed telemetry pt in atrial fibrillation with intermittent V pacing. HR increases to 150s with standing.: Filed Vitals:   01/06/14 1800 01/06/14 2005 01/07/14 0535 01/07/14 1013  BP: 135/86 145/86 141/83 117/83  Pulse: 43 96 90 102  Temp:  97.6 F (36.4 C) 98.1 F (36.7 C) 97.3 F (36.3 C)  TempSrc:  Oral Oral Oral  Resp: 19 20 18    Height:  5\' 11"  (1.803 m)    Weight:  211 lb 1.6 oz (95.754 kg) 208 lb 3.2 oz (94.439 kg)   SpO2: 95% 94% 94% 95%    Intake/Output Summary (Last 24 hours) at 01/07/14 1052 Last data filed at 01/07/14 1003  Gross per 24 hour  Intake    480 ml  Output    525 ml  Net    -45 ml   Filed Weights   01/06/14 1245 01/06/14 2005 01/07/14 0535  Weight: 215 lb (97.523 kg) 211 lb 1.6 oz (95.754 kg) 208 lb 3.2 oz (94.439 kg)    Subjective Still feels nauseated. No abdominal pain. With standing feels like he is going to pass out and legs get very weak.  . aspirin  81 mg Oral Daily  . metoprolol tartrate  25 mg Oral TID  . pantoprazole  40 mg Oral QHS  . simvastatin  20 mg Oral QHS  . warfarin  2.5 mg Oral STAT  . warfarin  2.5 mg Oral Once per day on Mon Tue Wed Thu Fri Sat  . [START ON 01/09/2014] warfarin  5 mg Oral Q Sun-1800  . Warfarin - Pharmacist Dosing Inpatient   Does not apply q1800      LABS: Basic Metabolic Panel:  Recent Labs  01/06/14 1305 01/07/14 0155  NA 143 141  K 4.3 4.8  CL 104 105  CO2 21 22  GLUCOSE 162* 117*  BUN 25* 21  CREATININE 1.38* 1.27  CALCIUM 9.7 9.2   Liver Function Tests:  Recent Labs  01/06/14 1305 01/07/14 0155  AST 24 24  ALT 14 13  ALKPHOS 57 53  BILITOT 0.6 0.5  PROT 7.7 7.2  ALBUMIN 3.9 3.6    Recent Labs  01/06/14 1305  LIPASE 31   CBC:  Recent Labs  01/06/14 1305 01/07/14 0155  WBC 10.1 9.8  NEUTROABS 7.5  --   HGB 14.6 13.5  HCT 43.8 40.2  MCV 97.1 96.6  PLT 229 226   Cardiac Enzymes:  Recent Labs  01/06/14 1305 01/06/14 2115 01/07/14 0156    TROPONINI <0.30 <0.30 <0.30   BNP:  Recent Labs  01/06/14 1305  PROBNP 2171.0*   D-Dimer: No results found for this basename: DDIMER,  in the last 72 hours Hemoglobin A1C: No results found for this basename: HGBA1C,  in the last 72 hours Fasting Lipid Panel: No results found for this basename: CHOL, HDL, LDLCALC, TRIG, CHOLHDL, LDLDIRECT,  in the last 72 hours Thyroid Function Tests: No results found for this basename: TSH, T4TOTAL, FREET3, T3FREE, THYROIDAB,  in the last 72 hours   Radiology/Studies:  Dg Chest Port 1 View  01/06/2014   CLINICAL DATA:  Shortness of breath.  EXAM: PORTABLE CHEST - 1 VIEW  COMPARISON:  PA and lateral chest 02/13/2007. Single view of the chest 07/05/2009.  FINDINGS: Pacing device remains in place. Lungs are clear. Heart size is upper normal. No pneumothorax or pleural effusion  IMPRESSION: No acute disease.   Electronically Signed   By: Inge Rise M.D.   On: 01/06/2014  13:30    PHYSICAL EXAM General: Well developed, obese, in no acute distress. Head: Normocephalic, atraumatic, sclera non-icteric, oropharynx is clear Neck: Negative for carotid bruits. JVD not elevated. No adenopathy Lungs: faint right basilar rales.  Breathing is unlabored. Heart: IRRR S1 S2 without murmurs, rubs, or gallops.  Abdomen: Soft, non-tender, non-distended with normoactive bowel sounds. No hepatomegaly. No rebound/guarding. No obvious abdominal masses. Extremities: trace edema.  Distal pedal pulses are 2+ and equal bilaterally. Neuro: Alert and oriented X 3. Moves all extremities spontaneously. Psych:  Responds to questions appropriately with a normal affect.  ASSESSMENT AND PLAN: 1. Near syncope. Patient is mildly orthostatic. May be mildly volume contracted from N/V yesterday. Will DC amlodipine. Unfortunately he has already received today. Will increase metoprolol to TID for better rate control. Support hose will help. Echo pending today.   2. Elevated BNP  without significant evidence of CHF. Will check Echo. No diuretics due to orthostasis.  3. Atrial fibrillation. By pacer interrogation HR has generally been well controlled. Increased HR with standing suggests volume depletion. I would gently hydrate and follow. Metoprolol increased to tid.   4. Acute nausea and vomiting. Vomiting resolved. Etiology unclear.  Present on Admission:  . Acute exacerbation of CHF (congestive heart failure) . Near syncope . Atrial fibrillation . Chronic diastolic heart failure . CORONARY ARTERY DISEASE . PPM-[employer] . HYPERTENSION . BENIGN PROSTATIC HYPERTROPHY . Syncope, near  Signed, Walker Sitar [place], [place] 01/07/2014 10:52 AM

## 2014-01-07 NOTE — Evaluation (Signed)
Physical Therapy Evaluation Patient Details Name: Francisco Merritt MRN: [id number] DOB: 07-08-1929 Today's Date: 01/07/2014   History of Present Illness  Pt is an 80 y.o. male with PMH of Single vessel CAD, P.Afib on coumadin and s/p pacemaker, BPH, h/o grade 1 diastolic dysfunction was in his usual state of health till morning of admission. He was out shopping and around 11:30am in the store he suddenly felt lightheaded and faint and was about to fall when he was supported by 2 bystanders. He was then seated on a stool nearby and subsequently started feeling queasy and vomited multiple times.  Clinical Impression  Pt admitted with the above. Pt currently with functional limitations due to the deficits listed below (see PT Problem List). At the time of PT eval pt was limited by decreased balance and presyncopal event. Pt will benefit from skilled PT to increase their independence and safety with mobility to allow discharge to the venue listed below.  While standing EOB pt stopped responding verbally and lost balance to the left. Therapist with total assist to redirect pt back to the bed to prevent a fall. Pt then states he "started blacking out". RN notified and vitals taken. On RA, sats 87% and 2L/min supplemental O2 was provided ~2 minutes and sats improved to 95%. Pulse tachycardic while seated in high 150's/low 160's, and quickly decreased to 106bpm. BP in supine was 137/105.     Follow Up Recommendations Home health PT;Supervision for mobility/OOB    Equipment Recommendations  Rolling walker with 5" wheels (Pt states daughter has walker, pt needs his own)    Recommendations for Other Services       Precautions / Restrictions Precautions Precautions: Fall Precaution Comments: Pt has already had one fall in the ED, near fall with PT during eval.  Restrictions Weight Bearing Restrictions: No      Mobility  Bed Mobility Overal bed mobility: Needs Assistance Bed Mobility: Supine to  Sit;Sit to Supine     Supine to sit: Supervision Sit to supine: Supervision   General bed mobility comments: VC's for sequencing and technique. Advised to move slowly due to history of orthostatics noted in the ED.   Transfers Overall transfer level: Needs assistance Equipment used: Rolling walker (2 wheeled) Transfers: Sit to/from Stand Sit to Stand: Min assist         General transfer comment: VC's for hand placement on seated surface for safety. Pt did not make corrective changes and pulled up to standing with the RW and therapist to anchor walker. Pt with difficulty maintaining static standing, and never achieved full standing position. Pt then stopped responding verbally and lost balance to the left. Therapist with total assist to redirect pt back to the bed to prevent a fall. Pt then states he "started blacking out". RN notified and vitals taken.   Ambulation/Gait             General Gait Details: Deferred for safety.   Stairs            Wheelchair Mobility    Modified Rankin (Stroke Patients Only)       Balance Overall balance assessment: History of Falls;Needs assistance Sitting-balance support: Feet supported;No upper extremity supported Sitting balance-Leahy Scale: Fair     Standing balance support: Bilateral upper extremity supported Standing balance-Leahy Scale: Poor                               Pertinent Vitals/Pain  Pain Assessment: No/denies pain    Home Living Family/patient expects to be discharged to:: Private residence Living Arrangements: Spouse/significant other;Children Available Help at Discharge: Family;Available 24 hours/day Type of Home: House Home Access: Ramped entrance     Home Layout: Two level;Able to live on main level with bedroom/bathroom Home Equipment: [place] - 2 wheels;Cane - single point;Electric scooter;Shower seat - built in (2 walk-in showers, 1 tub - bathrooms w/c accessible) Additional Comments:  Pt states his worry is that his wife will not be able to care for their handicapped daughter on her own. Pt is doing a lot of care for the daughter.     Prior Function Level of Independence: Independent         Comments: Still driving, goes with wife for grocery shopping     Hand Dominance   Dominant Hand: Right    Extremity/Trunk Assessment   Upper Extremity Assessment: Defer to OT evaluation           Lower Extremity Assessment: Generalized weakness      Cervical / Trunk Assessment: Normal  Communication   Communication: No difficulties  Cognition Arousal/Alertness: Awake/alert Behavior During Therapy: WFL for tasks assessed/performed Overall Cognitive Status: Within Functional Limits for tasks assessed                      General Comments      Exercises        Assessment/Plan    PT Assessment Patient needs continued PT services  PT Diagnosis Difficulty walking;Generalized weakness   PT Problem List Decreased strength;Decreased range of motion;Decreased activity tolerance;Decreased balance;Decreased mobility;Decreased knowledge of use of DME;Decreased safety awareness;Decreased knowledge of precautions;Cardiopulmonary status limiting activity  PT Treatment Interventions DME instruction;Gait training;Stair training;Therapeutic activities;Functional mobility training;Therapeutic exercise;Neuromuscular re-education;Patient/family education   PT Goals (Current goals can be found in the Care Plan section) Acute Rehab PT Goals Patient Stated Goal: To be able to continue caring for his handicapped daughter at home.  PT Goal Formulation: With patient Time For Goal Achievement: 01/21/14 Potential to Achieve Goals: Good    Frequency Min 3X/week   Barriers to discharge        Co-evaluation               End of Session Equipment Utilized During Treatment: Gait belt;Oxygen Activity Tolerance: Treatment limited secondary to medical complications  (Comment) Patient left: in bed;with bed alarm set;with call bell/phone within reach Nurse Communication: Mobility status;Other (comment) (Vitals after near fall)         Time: 0922-0950 PT Time Calculation (min): 28 min   Charges:   PT Evaluation $Initial PT Evaluation Tier I: 1 Procedure PT Treatments $Therapeutic Activity: 23-37 mins   PT G Codes:          Jolyn Lent 01/07/2014, 10:11 AM  Jolyn Lent, PT, DPT Acute Rehabilitation Services Pager: [phone]

## 2014-01-07 NOTE — Progress Notes (Signed)
  Echocardiogram 2D Echocardiogram has been performed.  Diamond Nickel 01/07/2014, 11:54 AM

## 2014-01-08 DIAGNOSIS — I429 Cardiomyopathy, unspecified: Secondary | ICD-10-CM

## 2014-01-08 DIAGNOSIS — I5042 Chronic combined systolic (congestive) and diastolic (congestive) heart failure: Secondary | ICD-10-CM

## 2014-01-08 LAB — PROTIME-INR
INR: 3.12 — ABNORMAL HIGH (ref 0.00–1.49)
PROTHROMBIN TIME: 32.1 s — AB (ref 11.6–15.2)

## 2014-01-08 MED ORDER — WARFARIN SODIUM 2 MG PO TABS
2.0000 mg | ORAL_TABLET | Freq: Once | ORAL | Status: AC
  Administered 2014-01-08: 2 mg via ORAL
  Filled 2014-01-08: qty 1

## 2014-01-08 MED ORDER — LISINOPRIL 2.5 MG PO TABS
2.5000 mg | ORAL_TABLET | Freq: Every day | ORAL | Status: DC
  Administered 2014-01-08 – 2014-01-09 (×2): 2.5 mg via ORAL
  Filled 2014-01-08 (×2): qty 1

## 2014-01-08 MED ORDER — INFLUENZA VAC SPLIT QUAD 0.5 ML IM SUSY
0.5000 mL | PREFILLED_SYRINGE | INTRAMUSCULAR | Status: AC
  Administered 2014-01-09: 0.5 mL via INTRAMUSCULAR
  Filled 2014-01-08: qty 0.5

## 2014-01-08 NOTE — Progress Notes (Signed)
[facility] PROGRESS NOTE  Francisco Merritt [id number]:[id number] DOB: 1929-11-16 DOA: 01/06/2014 PCP: Nyoka Cowden, MD  Assessment/Plan: Near syncope  -etiology unclear, ? Vaso-vagal event vs mild dehydration/volume contraction with N/V prior to admission. -patient also with some intermittent Afib with RVR noted; metoprolol increased to TID; HR improved. -continue telemetry eval -gentle IVF's to be continued for another 7 hours -2-D echo results with impaired EF and diffuse hypokinesis amlodipine and flomax discontinued -low dose of lisinopril added; will repeat orthostatics again in am  HYPERTENSION  -continue metoprolol TID -due to orthostatic changes will d/c norvasc -will add low dose lisnopril  Atrial fibrillation  -continue Metoprolol (at higher dose as recommended by cardiology), coumadin per pharmacy  Chronic combined systolic and diastolic heart failure  -trace edema with elevated BNP; but no JVD, no findings of vascular congestion on CXR -ECHO demonstrating EF 30-35% and diffuse hypokinesis (new low EF) -no diuretics at this moment -overall compensated -will add low dose of lisinopril -will need further evaluation as an outpatient  BENIGN PROSTATIC HYPERTROPHY  -continue holding hold flomax -patient still mildly orthostatic -no complaints of urinary retention   H/o Symptomatic bradycardia, Tachy/brady syndrome s/p pacer  -s/p Pacer interrogation in ER and appears to be ok -cardiology on board; will follow rec's -continue Metoprolol; now increased to TID -norvasc discontinued  CKD 3  -stable to improved with IVF's given   Code Status: Full Family Communication: wife and sister at bedside Disposition Plan: to be determine   Consultants:  Cardiology   Procedures:  2-D echo:  - Left ventricle: The cavity size was normal. Wall thickness was increased in a pattern of moderate LVH. Systolic function was moderately to severely reduced. The  estimated ejection fraction was in the range of 30% to 35%. Diffuse hypokinesis. - Aortic valve: There was mild regurgitation. - Mitral valve: Calcified annulus. - Left atrium: The atrium was mildly dilated. - Right atrium: The atrium was mildly dilated. - Pulmonary arteries: Systolic pressure was mildly to moderately increased. PA peak pressure: 52 mm Hg (S).  Impressions: - Moderate to severe LV dysfunction;; moderate LVH, mild AI, mild biatrial enlargement; mildly to moderately elevated pulmonary pressures.   Antibiotics:  None   HPI/Subjective: No further nausea or vomiting; denies CP and orthopnea. Still with mild orthostatic changes; denies dizziness  Objective: Filed Vitals:   01/08/14 0533  BP: 137/99  Pulse: 81  Temp: 97.6 F (36.4 C)  Resp: 20    Intake/Output Summary (Last 24 hours) at 01/08/14 1541 Last data filed at 01/08/14 0353  Gross per 24 hour  Intake    480 ml  Output    400 ml  Net     80 ml   Filed Weights   01/06/14 2005 01/07/14 0535 01/08/14 0533  Weight: 95.754 kg (211 lb 1.6 oz) 94.439 kg (208 lb 3.2 oz) 95.5 kg (210 lb 8.6 oz)    Exam:   General:  AAOX3; denies further nausea or vomiting; reports no dizziness today. Mild orthostatic changes  Cardiovascular: irregular, no rubs or gallops; no JVD appreciated  Respiratory: no wheezing, no frank crackles; good air movement  Abdomen: soft, NT, ND, positive BS  Musculoskeletal: no cyanosis; trace edema bilaterally  Data Reviewed: Basic Metabolic Panel:  Recent Labs Lab 01/06/14 1305 01/07/14 0155  NA 143 141  K 4.3 4.8  CL 104 105  CO2 21 22  GLUCOSE 162* 117*  BUN 25* 21  CREATININE 1.38* 1.27  CALCIUM 9.7 9.2   Liver Function Tests:  Recent Labs Lab 01/06/14 1305 01/07/14 0155  AST 24 24  ALT 14 13  ALKPHOS 57 53  BILITOT 0.6 0.5  PROT 7.7 7.2  ALBUMIN 3.9 3.6    Recent Labs Lab 01/06/14 1305  LIPASE 31   CBC:  Recent Labs Lab 01/06/14 1305  01/07/14 0155  WBC 10.1 9.8  NEUTROABS 7.5  --   HGB 14.6 13.5  HCT 43.8 40.2  MCV 97.1 96.6  PLT 229 226   Cardiac Enzymes:  Recent Labs Lab 01/06/14 1305 01/06/14 2115 01/07/14 0156 01/07/14 1020  TROPONINI <0.30 <0.30 <0.30 <0.30   BNP (last 3 results)  Recent Labs  01/06/14 1305  PROBNP 2171.0*    Studies: No results found.  Scheduled Meds: . lisinopril  2.5 mg Oral Daily  . metoprolol tartrate  25 mg Oral TID  . pantoprazole  40 mg Oral QHS  . simvastatin  20 mg Oral QHS  . warfarin  2 mg Oral ONCE-1800  . Warfarin - Pharmacist Dosing Inpatient   Does not apply q1800   Continuous Infusions: . sodium chloride 50 mL/hr at 01/08/14 1143    Principal Problem:   Near syncope Active Problems:   HYPERTENSION   CORONARY ARTERY DISEASE   Persistent atrial fibrillation   Chronic diastolic heart failure   BENIGN PROSTATIC HYPERTROPHY   PPM-[employer]   Acute exacerbation of CHF (congestive heart failure)   Syncope, near   Secondary cardiomyopathy    Time spent: >30 minutes    Barton Dubois  [facility] Pager [phone]. If 7PM-7AM, please contact night-coverage at [email or web address], password [facility] 01/08/2014, 3:41 PM  LOS: 2 days

## 2014-01-08 NOTE — Progress Notes (Signed)
ANTICOAGULATION CONSULT NOTE - Follow Up Consult  Pharmacy Consult for coumadin Indication: atrial fibrillation  Allergies  Allergen Reactions  . Sulfonamide Derivatives     REACTION: n/v    Patient Measurements: Height: 5\' 11"  (180.3 cm) Weight: 210 lb 8.6 oz (95.5 kg) IBW/kg (Calculated) : 75.3 Heparin Dosing Weight:   Vital Signs: Temp: 97.6 F (36.4 C) (09/26 0533) Temp src: Oral (09/26 0533) BP: 137/99 mmHg (09/26 0533) Pulse Rate: 81 (09/26 0533)  Labs:  Recent Labs  01/06/14 1305 01/06/14 2115 01/07/14 0155 01/07/14 0156 01/07/14 1020 01/08/14 0524  HGB 14.6  --  13.5  --   --   --   HCT 43.8  --  40.2  --   --   --   PLT 229  --  226  --   --   --   LABPROT  --  28.4* 29.9*  --   --  32.1*  INR  --  2.67* 2.85*  --   --  3.12*  CREATININE 1.38*  --  1.27  --   --   --   TROPONINI <0.30 <0.30  --  <0.30 <0.30  --     Estimated Creatinine Clearance: 53.8 ml/min (by C-G formula based on Cr of 1.27).   Medications:  Scheduled:  . aspirin  81 mg Oral Daily  . metoprolol tartrate  25 mg Oral TID  . pantoprazole  40 mg Oral QHS  . simvastatin  20 mg Oral QHS  . warfarin  2.5 mg Oral Once per day on Mon Tue Wed Thu Fri Sat  . [START ON 01/09/2014] warfarin  5 mg Oral Q Sun-1800  . Warfarin - Pharmacist Dosing Inpatient   Does not apply q1800   Infusions:  . sodium chloride Stopped (01/07/14 1200)    Assessment: 80 yo male with afib is currently on supratherapeutic coumadin.  INR suddenly went up to 3.12. Goal of Therapy:  INR 2-3 Monitor platelets by anticoagulation protocol: Yes   Plan:  Change coumadin to 2 mg po x1 INR in am   Angalena Cousineau, Tsz-Yin 01/08/2014,10:15 AM

## 2014-01-08 NOTE — Progress Notes (Signed)
Primary cardiologist: Dr. Cristopher Peru  Subjective:   Patient feels somewhat better this morning. No further nausea or emesis. No chest pain. Mentions that he has been more short of breath in the last few weeks.     Objective:   Temp:  [97.6 F (36.4 C)-98.3 F (36.8 C)] 97.6 F (36.4 C) (09/26 0533) Pulse Rate:  [76-106] 81 (09/26 0533) Resp:  [20] 20 (09/26 0533) BP: (104-137)/(70-99) 137/99 mmHg (09/26 0533) SpO2:  [94 %-100 %] 100 % (09/26 0533) Weight:  [210 lb 8.6 oz (95.5 kg)] 210 lb 8.6 oz (95.5 kg) (09/26 0533) Last BM Date: 01/05/14  Filed Weights   01/06/14 2005 01/07/14 0535 01/08/14 0533  Weight: 211 lb 1.6 oz (95.754 kg) 208 lb 3.2 oz (94.439 kg) 210 lb 8.6 oz (95.5 kg)    Intake/Output Summary (Last 24 hours) at 01/08/14 1055 Last data filed at 01/08/14 0353  Gross per 24 hour  Intake    720 ml  Output    650 ml  Net     70 ml    Telemetry: Atrial fibrillation with pacing.  Exam:  General: Overweight male, no distress.  Lungs: Clear, nonlabored.  Cardiac: Irregular, indistinct PMI, soft systolic murmur at apex.  Abdomen: Protuberant.  Extremities: Trace edema.   Lab Results:  Basic Metabolic Panel:  Recent Labs Lab 01/06/14 1305 01/07/14 0155  NA 143 141  K 4.3 4.8  CL 104 105  CO2 21 22  GLUCOSE 162* 117*  BUN 25* 21  CREATININE 1.38* 1.27  CALCIUM 9.7 9.2    Liver Function Tests:  Recent Labs Lab 01/06/14 1305 01/07/14 0155  AST 24 24  ALT 14 13  ALKPHOS 57 53  BILITOT 0.6 0.5  PROT 7.7 7.2  ALBUMIN 3.9 3.6    CBC:  Recent Labs Lab 01/06/14 1305 01/07/14 0155  WBC 10.1 9.8  HGB 14.6 13.5  HCT 43.8 40.2  MCV 97.1 96.6  PLT 229 226    Cardiac Enzymes:  Recent Labs Lab 01/06/14 2115 01/07/14 0156 01/07/14 1020  TROPONINI <0.30 <0.30 <0.30    BNP:  Recent Labs  01/06/14 1305  PROBNP 2171.0*    Coagulation:  Recent Labs Lab 01/06/14 2115 01/07/14 0155 01/08/14 0524  INR 2.67* 2.85*  3.12*    Echocardiogram (01/07/14):   Study Conclusions  - Left ventricle: The cavity size was normal. Wall thickness was increased in a pattern of moderate LVH. Systolic function was moderately to severely reduced. The estimated ejection fraction was in the range of 30% to 35%. Diffuse hypokinesis. - Aortic valve: There was mild regurgitation. - Mitral valve: Calcified annulus. - Left atrium: The atrium was mildly dilated. - Right atrium: The atrium was mildly dilated. - Pulmonary arteries: Systolic pressure was mildly to moderately increased. PA peak pressure: 52 mm Hg (S).  Impressions:  - Moderate to severe LV dysfunction;; moderate LVH, mild AI, mild biatrial enlargement; mildly to moderately elevated pulmonary pressures.   Medications:   Scheduled Medications: . aspirin  81 mg Oral Daily  . metoprolol tartrate  25 mg Oral TID  . pantoprazole  40 mg Oral QHS  . simvastatin  20 mg Oral QHS  . warfarin  2 mg Oral ONCE-1800  . Warfarin - Pharmacist Dosing Inpatient   Does not apply q1800     Infusions: . sodium chloride Stopped (01/07/14 1200)     PRN Medications:  acetaminophen, acetaminophen, ondansetron (ZOFRAN) IV, traMADol   Assessment:   1. Newly diagnosed  cardiomyopathy, LVEF 30-35% with diffuse hypokinesis, could be nonischemic. Cardiac markers normal.  2. Presentation with near-syncope, nausea and emesis, probable volume contraction with orthostasis. Norvasc discontinued. Also not onl diuretic at present.  3. Chronic atrial fibrillation, on Coumadin. Continue strategy of heart rate control.  4. Tachycardia-bradycardia syndrome, status post Guidant pacemaker.  5. Known CAD, nonobstructive in 2008 except for occluded diagonal branch.   Plan/Discussion:    Continue Coumadin, Lopressor, stop aspirin for now. Norvasc was discontinued. Not orthostatic by blood pressure although heart rate did increase. Once further back to baseline in terms of volume  status, may want to consider initiating a low-dose ACE inhibitor with newly documented cardiomyopathy. He will need further evaluation of his cardiomyopathy, was a patient of Dr. Haroldine Laws in the past. Consider referral to CHF clinic.   Satira Sark, M.D., F.A.C.C.

## 2014-01-09 DIAGNOSIS — K219 Gastro-esophageal reflux disease without esophagitis: Secondary | ICD-10-CM

## 2014-01-09 DIAGNOSIS — I4891 Unspecified atrial fibrillation: Secondary | ICD-10-CM | POA: Diagnosis not present

## 2014-01-09 LAB — BASIC METABOLIC PANEL
Anion gap: 13 (ref 5–15)
BUN: 21 mg/dL (ref 6–23)
CALCIUM: 8.8 mg/dL (ref 8.4–10.5)
CO2: 23 meq/L (ref 19–32)
CREATININE: 1.28 mg/dL (ref 0.50–1.35)
Chloride: 102 mEq/L (ref 96–112)
GFR calc Af Amer: 59 mL/min — ABNORMAL LOW (ref >90)
GFR calc non Af Amer: 51 mL/min — ABNORMAL LOW (ref >90)
GLUCOSE: 99 mg/dL (ref 70–99)
Potassium: 4.2 mEq/L (ref 3.7–5.3)
Sodium: 138 mEq/L (ref 137–147)

## 2014-01-09 LAB — PROTIME-INR
INR: 2.71 — ABNORMAL HIGH (ref 0.00–1.49)
Prothrombin Time: 28.8 seconds — ABNORMAL HIGH (ref 11.6–15.2)

## 2014-01-09 MED ORDER — METOPROLOL TARTRATE 50 MG PO TABS: 50.0000 mg | ORAL_TABLET | Freq: Two times a day (BID) | ORAL | Status: AC

## 2014-01-09 MED ORDER — PANTOPRAZOLE SODIUM 40 MG PO TBEC: 40.0000 mg | DELAYED_RELEASE_TABLET | Freq: Every day | ORAL | Status: AC

## 2014-01-09 MED ORDER — METOPROLOL TARTRATE 50 MG PO TABS: 50.0000 mg | ORAL_TABLET | Freq: Two times a day (BID) | ORAL | Status: DC

## 2014-01-09 MED ORDER — WARFARIN SODIUM 2 MG PO TABS
2.0000 mg | ORAL_TABLET | Freq: Once | ORAL | Status: DC
  Filled 2014-01-09: qty 1

## 2014-01-09 MED ORDER — PANTOPRAZOLE SODIUM 40 MG PO TBEC: 40.0000 mg | DELAYED_RELEASE_TABLET | Freq: Every day | ORAL | Status: DC

## 2014-01-09 MED ORDER — LISINOPRIL 2.5 MG PO TABS: 2.5000 mg | ORAL_TABLET | Freq: Every day | ORAL | Status: AC

## 2014-01-09 MED ORDER — LISINOPRIL 2.5 MG PO TABS: 2.5000 mg | ORAL_TABLET | Freq: Every day | ORAL | Status: DC

## 2014-01-09 MED ORDER — METOPROLOL TARTRATE 50 MG PO TABS
50.0000 mg | ORAL_TABLET | Freq: Two times a day (BID) | ORAL | Status: DC
  Administered 2014-01-09: 50 mg via ORAL
  Filled 2014-01-09 (×2): qty 1

## 2014-01-09 NOTE — Progress Notes (Signed)
ANTICOAGULATION CONSULT NOTE - Follow Up Consult  Pharmacy Consult for coumadin Indication: atrial fibrillation  Allergies  Allergen Reactions  . Sulfonamide Derivatives     REACTION: n/v    Patient Measurements: Height: 5\' 11"  (180.3 cm) Weight: 211 lb 3.2 oz (95.8 kg) IBW/kg (Calculated) : 75.3 Heparin Dosing Weight:   Vital Signs: Temp: 98 F (36.7 C) (09/27 0437) Temp src: Oral (09/27 0437) BP: 125/78 mmHg (09/27 0437) Pulse Rate: 80 (09/27 0437)  Labs:  Recent Labs  01/06/14 1305  01/06/14 2115 01/07/14 0155 01/07/14 0156 01/07/14 1020 01/08/14 0524 01/09/14 0418  HGB 14.6  --   --  13.5  --   --   --   --   HCT 43.8  --   --  40.2  --   --   --   --   PLT 229  --   --  226  --   --   --   --   LABPROT  --   < > 28.4* 29.9*  --   --  32.1* 28.8*  INR  --   < > 2.67* 2.85*  --   --  3.12* 2.71*  CREATININE 1.38*  --   --  1.27  --   --   --  1.28  TROPONINI <0.30  --  <0.30  --  <0.30 <0.30  --   --   < > = values in this interval not displayed.  Estimated Creatinine Clearance: 53.5 ml/min (by C-G formula based on Cr of 1.28).   Medications:  Scheduled:  . Influenza vac split quadrivalent PF  0.5 mL Intramuscular Tomorrow-1000  . lisinopril  2.5 mg Oral Daily  . metoprolol tartrate  50 mg Oral BID  . pantoprazole  40 mg Oral QHS  . simvastatin  20 mg Oral QHS  . Warfarin - Pharmacist Dosing Inpatient   Does not apply q1800   Infusions:    Assessment: 80 yo male with hx of afib is currently on therapeutic coumadin.  INR today is down to 2.71 Goal of Therapy:  INR 2-3 Monitor platelets by anticoagulation protocol: Yes   Plan:  - coumadin 2 mg po x1 - INR in am  Rakeem Colley, Tsz-Yin 01/09/2014,10:10 AM

## 2014-01-09 NOTE — Discharge Summary (Signed)
Physician Discharge Summary  Francisco Merritt PFX:[id number] DOB: Oct 11, 1929 DOA: 01/06/2014  PCP: Nyoka Cowden, MD  Admit date: 01/06/2014 Discharge date: 01/09/2014  Time spent: >30 minutes  Recommendations for Outpatient Follow-up:  Check BMET to follow electrolytes and renal function Reassess HR and adjust b-blockers Patient needs follow up with cardiology for further evaluation of ne worsening EF  Discharge Diagnoses:  Principal Problem:   Near syncope Active Problems:   HYPERTENSION   CORONARY ARTERY DISEASE   Persistent atrial fibrillation   Chronic diastolic heart failure   BENIGN PROSTATIC HYPERTROPHY   PPM-[employer]   Acute exacerbation of CHF (congestive heart failure)   Syncope, near   Secondary cardiomyopathy   Discharge Condition: stable and improved. Will discharge home with [facility] services. Follow up with cardiology in 1 week and with PCP in 10 days.  Diet recommendation: heart healthy low sodium diet and fluid restriction  Filed Weights   01/07/14 0535 01/08/14 0533 01/09/14 0437  Weight: 94.439 kg (208 lb 3.2 oz) 95.5 kg (210 lb 8.6 oz) 95.8 kg (211 lb 3.2 oz)    History of present illness:  80 y.o. male with PMH of Single vessel CAD, P.Afib on coumadin and s/p pacemaker, BPH, h/o grade 1 diastolic dysfunction was in his usual state of health till this am.  He was out shopping and around 11:30am in the Store he suddenly felt lightheaded and faint and was about to fall when he was supported by 2 bystanders.  He was then seated on a stool nearby and subsequently started feeling queasy and vomited multiple times.  His vomitus was non bloody or bilious. Last BM yesterday, no melena  He denies any abd pain, no chest pain, or palpitations, no fevers or chills.  EMS was called and he was brought to the ER where he was mildly hypoxic, labs unremarkable except BNP 2171, his pacer was interrogated no arrhythmias noted at time of event, occasional Afib  with RVR noted.  Hospital Course:  Near syncope  -specific etiology unclear, ? Vaso-vagal event vs mild dehydration/volume contraction with N/V prior to admission vs due to tachyarrhythmias.  -metoprolol increased to 50mg  BID for better control of HR; norvasc has been discontinued -after gentle hydration, no further orthostasis or dizziness appreciated -2-D echo results with impaired EF and diffuse hypokinesis  -follow with cardiology  HYPERTENSION  -due to orthostatic changes will d/c norvasc  -will continue metoprolol and low dose lisnopril   Atrial fibrillation  -continue Metoprolol (at higher dose as recommended by cardiology)  -continue coumadin   Chronic combined systolic and diastolic heart failure  -trace edema with elevated BNP; but no JVD, no findings of vascular congestion on CXR  -ECHO demonstrating EF 30-35% and diffuse hypokinesis (new low EF)  -no diuretics at this moment (overall compensated and experiencing orthostatic changes) -will continue b-blocker and add low dose of lisinopril  -will need further evaluation as an outpatient (including most likely cardiac cath) -further medication adjustments per cardiology  BENIGN PROSTATIC HYPERTROPHY  -continue flomax  H/o Symptomatic bradycardia, Tachy/brady syndrome s/p pacer  -s/p Pacer interrogation in ER and appears to be ok  -cardiology on board; will follow rec's  -continue Metoprolol; now increased to 50mg  BID  -norvasc discontinued   CKD 3  -stable to improved with IVF's given -close follow up with initiation of lisinopril    Procedures: 2-D echo:  - Left ventricle: The cavity size was normal. Wall thickness was increased in a pattern of moderate LVH. Systolic function was  moderately to severely reduced. The estimated ejection fraction was in the range of 30% to 35%. Diffuse hypokinesis. - Aortic valve: There was mild regurgitation. - Mitral valve: Calcified annulus. - Left atrium: The atrium was  mildly dilated. - Right atrium: The atrium was mildly dilated. - Pulmonary arteries: Systolic pressure was mildly to moderately increased. PA peak pressure: 52 mm Hg (S).  Impressions: - Moderate to severe LV dysfunction;; moderate LVH, mild AI, mild biatrial enlargement; mildly to moderately elevated pulmonary pressures.  Consultations:  Cardiology   Discharge Exam: Filed Vitals:   01/09/14 1141  BP: 136/94  Pulse:   Temp:   Resp:    General: AAOX3; denies further nausea or vomiting; reports no dizziness today. No orthostatic changes  Cardiovascular: irregular, no rubs or gallops; no JVD appreciated. Trace edema bilaterally Respiratory: no wheezing, no frank crackles; good air movement bilaterally Abdomen: soft, NT, ND, positive BS  Musculoskeletal: no cyanosis; no clubbing    Discharge Instructions You were cared for by a hospitalist during your hospital stay. If you have any questions about your discharge medications or the care you received while you were in the hospital after you are discharged, you can call the unit and asked to speak with the hospitalist on call if the hospitalist that took care of you is not available. Once you are discharged, your primary care physician will handle any further medical issues. Please note that NO REFILLS for any discharge medications will be authorized once you are discharged, as it is imperative that you return to your primary care physician (or establish a relationship with a primary care physician if you do not have one) for your aftercare needs so that they can reassess your need for medications and monitor your lab values.  Discharge Instructions   Diet - low sodium heart healthy    Complete by:  As directed      Discharge instructions    Complete by:  As directed   Take medications as prescribed Follow a low sodium diet (less than 2 grams daily) Restrict fluid intake to 2L in 24 hours Wear stocking socks as instructed Arrange  follow up with PCP in 10 days and with cardiology service in 1 week          Current Discharge Medication List    START taking these medications   Details  lisinopril (PRINIVIL,ZESTRIL) 2.5 MG tablet Take 1 tablet (2.5 mg total) by mouth daily. Qty: 30 tablet, Refills: 1    pantoprazole (PROTONIX) 40 MG tablet Take 1 tablet (40 mg total) by mouth daily. Qty: 30 tablet, Refills: 1      CONTINUE these medications which have CHANGED   Details  metoprolol tartrate (LOPRESSOR) 50 MG tablet Take 1 tablet (50 mg total) by mouth 2 (two) times daily. Qty: 180 tablet, Refills: 3   Associated Diagnoses: Other specified cardiac dysrhythmias(427.89)      CONTINUE these medications which have NOT CHANGED   Details  acetaminophen (TYLENOL ARTHRITIS PAIN) 650 MG CR tablet Take 650 mg by mouth 2 (two) times daily.     aspirin 81 MG tablet Take 81 mg by mouth daily.      glucosamine-chondroitin 500-400 MG tablet Take 1 tablet by mouth 2 (two) times daily.      Multiple Vitamin (MULTIVITAMIN) tablet Take 1 tablet by mouth daily.      nitroGLYCERIN (NITROSTAT) 0.4 MG SL tablet Place 1 tablet (0.4 mg total) under the tongue every 5 (five) minutes as needed. Qty: 90  tablet, Refills: 6    simvastatin (ZOCOR) 20 MG tablet Take 20 mg by mouth at bedtime.    tamsulosin (FLOMAX) 0.4 MG CAPS capsule Take 0.4 mg by mouth daily after supper.    traMADol (ULTRAM) 50 MG tablet Take 50 mg by mouth every 6 (six) hours as needed (for pain).    warfarin (COUMADIN) 5 MG tablet Take 2.5-5 mg by mouth every morning. Take 2.5 mg daily except 5 mg on sundays      STOP taking these medications     amLODipine (NORVASC) 5 MG tablet        Allergies  Allergen Reactions  . Sulfonamide Derivatives     REACTION: n/v   Follow-up Information   Follow up with [facility]. (Rolling Walker / Rollator with seat  to be delivered to patients home following discharge home.)    Contact information:    [address] [place] [address] [phone]       Follow up with [facility]. (Registered Nurse and Physical Therapy Services to start within 24-48 hours of discharge home)    Contact information:   [address] [place] [address] [phone]       Follow up with Nyoka Cowden, MD. Schedule an appointment as soon as possible for a visit in 10 days.   Specialty:  Internal Medicine   Contact information:   [place] [address] [phone]       Follow up with Glori Bickers, MD. Schedule an appointment as soon as possible for a visit in 1 week.   Specialty:  Cardiology   Contact information:   [address] [place] [address] [phone]       The results of significant diagnostics from this hospitalization (including imaging, microbiology, ancillary and laboratory) are listed below for reference.    Significant Diagnostic Studies: Dg Chest Port 1 View  01/06/2014   CLINICAL DATA:  Shortness of breath.  EXAM: PORTABLE CHEST - 1 VIEW  COMPARISON:  PA and lateral chest 02/13/2007. Single view of the chest 07/05/2009.  FINDINGS: Pacing device remains in place. Lungs are clear. Heart size is upper normal. No pneumothorax or pleural effusion  IMPRESSION: No acute disease.   Electronically Signed   By: Inge Rise M.D.   On: 01/06/2014 13:30   Labs: Basic Metabolic Panel:  Recent Labs Lab 01/06/14 1305 01/07/14 0155 01/09/14 0418  NA 143 141 138  K 4.3 4.8 4.2  CL 104 105 102  CO2 21 22 23   GLUCOSE 162* 117* 99  BUN 25* 21 21  CREATININE 1.38* 1.27 1.28  CALCIUM 9.7 9.2 8.8   Liver Function Tests:  Recent Labs Lab 01/06/14 1305 01/07/14 0155  AST 24 24  ALT 14 13  ALKPHOS 57 53  BILITOT 0.6 0.5  PROT 7.7 7.2  ALBUMIN 3.9 3.6    Recent Labs Lab 01/06/14 1305  LIPASE 31   CBC:  Recent Labs Lab 01/06/14 1305 01/07/14 0155  WBC 10.1 9.8   NEUTROABS 7.5  --   HGB 14.6 13.5  HCT 43.8 40.2  MCV 97.1 96.6  PLT 229 226   Cardiac Enzymes:  Recent Labs Lab 01/06/14 1305 01/06/14 2115 01/07/14 0156 01/07/14 1020  TROPONINI <0.30 <0.30 <0.30 <0.30   BNP: BNP (last 3 results)  Recent Labs  01/06/14 1305  PROBNP 2171.0*   Signed:  Barton Dubois  [facility] 01/09/2014, 12:24 PM

## 2014-01-09 NOTE — Progress Notes (Signed)
Primary cardiologist: Dr. Cristopher Peru  Subjective:   States he did not sleep well last night. Has been urinating frequently. Also worried because his handicapped son is currently hospitalized with seizures.   Objective:   Temp:  [97.8 F (36.6 C)-98.4 F (36.9 C)] 98 F (36.7 C) (09/27 0437) Pulse Rate:  [77-80] 80 (09/27 0437) Resp:  [18-20] 20 (09/27 0437) BP: (111-125)/(65-78) 125/78 mmHg (09/27 0437) SpO2:  [95 %-98 %] 98 % (09/27 0437) Weight:  [211 lb 3.2 oz (95.8 kg)] 211 lb 3.2 oz (95.8 kg) (09/27 0437) Last BM Date: 01/05/14  Filed Weights   01/07/14 0535 01/08/14 0533 01/09/14 0437  Weight: 208 lb 3.2 oz (94.439 kg) 210 lb 8.6 oz (95.5 kg) 211 lb 3.2 oz (95.8 kg)    Intake/Output Summary (Last 24 hours) at 01/09/14 0919 Last data filed at 01/09/14 0830  Gross per 24 hour  Intake    480 ml  Output   2175 ml  Net  -1695 ml    Telemetry: Atrial fibrillation with intermittent ventricular pacing.  Exam:  General: Overweight male, no distress.  Lungs: Clear, nonlabored.  Cardiac: Irregular, indistinct PMI, soft systolic murmur at apex.  Abdomen: Protuberant.  Extremities: Trace edema.   Lab Results:  Basic Metabolic Panel:  Recent Labs Lab 01/06/14 1305 01/07/14 0155 01/09/14 0418  NA 143 141 138  K 4.3 4.8 4.2  CL 104 105 102  CO2 21 22 23   GLUCOSE 162* 117* 99  BUN 25* 21 21  CREATININE 1.38* 1.27 1.28  CALCIUM 9.7 9.2 8.8    Liver Function Tests:  Recent Labs Lab 01/06/14 1305 01/07/14 0155  AST 24 24  ALT 14 13  ALKPHOS 57 53  BILITOT 0.6 0.5  PROT 7.7 7.2  ALBUMIN 3.9 3.6    CBC:  Recent Labs Lab 01/06/14 1305 01/07/14 0155  WBC 10.1 9.8  HGB 14.6 13.5  HCT 43.8 40.2  MCV 97.1 96.6  PLT 229 226    Cardiac Enzymes:  Recent Labs Lab 01/06/14 2115 01/07/14 0156 01/07/14 1020  TROPONINI <0.30 <0.30 <0.30    BNP:  Recent Labs  01/06/14 1305  PROBNP 2171.0*    Coagulation:  Recent Labs Lab  01/07/14 0155 01/08/14 0524 01/09/14 0418  INR 2.85* 3.12* 2.71*    Echocardiogram (01/07/14):   Study Conclusions  - Left ventricle: The cavity size was normal. Wall thickness was increased in a pattern of moderate LVH. Systolic function was moderately to severely reduced. The estimated ejection fraction was in the range of 30% to 35%. Diffuse hypokinesis. - Aortic valve: There was mild regurgitation. - Mitral valve: Calcified annulus. - Left atrium: The atrium was mildly dilated. - Right atrium: The atrium was mildly dilated. - Pulmonary arteries: Systolic pressure was mildly to moderately increased. PA peak pressure: 52 mm Hg (S).  Impressions:  - Moderate to severe LV dysfunction;; moderate LVH, mild AI, mild biatrial enlargement; mildly to moderately elevated pulmonary pressures.   Medications:   Scheduled Medications: . Influenza vac split quadrivalent PF  0.5 mL Intramuscular Tomorrow-1000  . lisinopril  2.5 mg Oral Daily  . metoprolol tartrate  25 mg Oral TID  . pantoprazole  40 mg Oral QHS  . simvastatin  20 mg Oral QHS  . Warfarin - Pharmacist Dosing Inpatient   Does not apply q1800    PRN Medications: acetaminophen, acetaminophen, ondansetron (ZOFRAN) IV, traMADol   Assessment:   1. Newly diagnosed cardiomyopathy, LVEF 30-35% with diffuse hypokinesis, could be  nonischemic. Cardiac markers normal.  2. Presentation with near-syncope, nausea and emesis, probable volume contraction with orthostasis. Norvasc discontinued. Also not on diuretic at present.  3. Chronic atrial fibrillation, on Coumadin. Continue strategy of heart rate control.  4. Tachycardia-bradycardia syndrome, status post Guidant pacemaker.  5. Known CAD, nonobstructive in 2008 except for occluded diagonal branch.   Plan/Discussion:    Continue Coumadin, try and increase Lopressor to 50 mg twice daily for more optimal heart rate control particularly with cardiomyopathy. He seems to be  tolerating low-dose ACE inhibitor which was added yesterday. Keep off Norvasc and aspirin for now. Recheck orthostatics. He will need further evaluation of his cardiomyopathy, was a patient of Dr. Haroldine Laws in the past. Consider referral to CHF clinic.   Satira Sark, M.D., F.A.C.C.

## 2014-01-09 NOTE — Progress Notes (Signed)
Patient discharged to home, transported by wife.  IV removed prior to discharge; IV site clean, dry, and intact.  Discharge instructions, education, and medications discussed with patient prior to discharge; patient voiced understanding of information.

## 2014-01-10 NOTE — Progress Notes (Signed)
UR completed - retro  Song Myre K. Berkley Wrightsman, RN, BSN, [place], CCM  01/10/2014 11:56 AM

## 2014-01-13 ENCOUNTER — Ambulatory Visit: Payer: Commercial Managed Care - HMO

## 2014-01-14 ENCOUNTER — Encounter (HOSPITAL_COMMUNITY): Payer: Self-pay | Admitting: Pharmacy Technician

## 2014-01-14 ENCOUNTER — Ambulatory Visit (HOSPITAL_COMMUNITY)
Admission: RE | Admit: 2014-01-14 | Discharge: 2014-01-14 | Disposition: A | Discharge status: Home / Self Care | Payer: Medicare HMO | Source: Ambulatory Visit | Attending: Internal Medicine | Admitting: Internal Medicine

## 2014-01-14 ENCOUNTER — Encounter (HOSPITAL_COMMUNITY): Payer: Self-pay

## 2014-01-14 VITALS — BP 132/82 | HR 97 | Resp 18 | Wt 213.4 lb

## 2014-01-14 DIAGNOSIS — I429 Cardiomyopathy, unspecified: Secondary | ICD-10-CM

## 2014-01-14 DIAGNOSIS — I482 Chronic atrial fibrillation: Secondary | ICD-10-CM | POA: Diagnosis not present

## 2014-01-14 DIAGNOSIS — I10 Essential (primary) hypertension: Secondary | ICD-10-CM | POA: Insufficient documentation

## 2014-01-14 DIAGNOSIS — Z5189 Encounter for other specified aftercare: Secondary | ICD-10-CM | POA: Insufficient documentation

## 2014-01-14 DIAGNOSIS — Z7901 Long term (current) use of anticoagulants: Secondary | ICD-10-CM | POA: Insufficient documentation

## 2014-01-14 DIAGNOSIS — Z79899 Other long term (current) drug therapy: Secondary | ICD-10-CM | POA: Insufficient documentation

## 2014-01-14 DIAGNOSIS — I5020 Unspecified systolic (congestive) heart failure: Secondary | ICD-10-CM | POA: Diagnosis not present

## 2014-01-14 DIAGNOSIS — Z950 Presence of cardiac pacemaker: Secondary | ICD-10-CM | POA: Diagnosis not present

## 2014-01-14 DIAGNOSIS — Z9181 History of falling: Secondary | ICD-10-CM | POA: Diagnosis not present

## 2014-01-14 DIAGNOSIS — I2510 Atherosclerotic heart disease of native coronary artery without angina pectoris: Secondary | ICD-10-CM | POA: Diagnosis not present

## 2014-01-14 DIAGNOSIS — I5022 Chronic systolic (congestive) heart failure: Secondary | ICD-10-CM

## 2014-01-14 DIAGNOSIS — R55 Syncope and collapse: Secondary | ICD-10-CM | POA: Diagnosis not present

## 2014-01-14 DIAGNOSIS — R0600 Dyspnea, unspecified: Secondary | ICD-10-CM | POA: Diagnosis not present

## 2014-01-14 DIAGNOSIS — E785 Hyperlipidemia, unspecified: Secondary | ICD-10-CM | POA: Diagnosis not present

## 2014-01-14 DIAGNOSIS — I25118 Atherosclerotic heart disease of native coronary artery with other forms of angina pectoris: Secondary | ICD-10-CM

## 2014-01-14 DIAGNOSIS — R0602 Shortness of breath: Secondary | ICD-10-CM

## 2014-01-14 DIAGNOSIS — Z7982 Long term (current) use of aspirin: Secondary | ICD-10-CM | POA: Diagnosis not present

## 2014-01-14 NOTE — Patient Instructions (Signed)
Heart catheterization scheduled with Dr. Haroldine Laws at [facility] on WEdnesday, October 7th at 2:00 pm. (See instruction sheet for further details)  Follow up with our office in 3 weeks.  Do the following things EVERYDAY: 1) Weigh yourself in the morning before breakfast. Write it down and keep it in a log. 2) Take your medicines as prescribed 3) Eat low salt foods-Limit salt (sodium) to 2000 mg per day.  4) Stay as active as you can everyday 5) Limit all fluids for the day to less than 2 liters

## 2014-01-14 NOTE — Progress Notes (Signed)
Patient ID: Francisco Merritt, male   DOB: Jun 21, 1929, 80 y.o.   MRN: [id number]  Primary Cardiologist: Dr Lovena Le HPI:  Mr Benzel is an 80 year old with a history of obesity, hypertension, one-vessel coronary artery disease with a totally occluded diagonal branch by catheterization. He also history of atrial fibrillation, complicated by tachybradycardia syndrome and is status post pacemaker implantation and takes coumadin.  He is also on sotalol therapy.  In the past he had had difficulty with beta blocker and diltiazem due to low heart rates.   Recently admitted to [facility] 01/06/14 with near syncope thought to be form vasovagal episode with N/V. Hypoxia noted. Device was interroagated  and showed his HR < 100  90% of te time. He had an ECHO that showed reduced EF to 30-35%. Cardiac markers negative. He was discharged on low dose ace. Amlodipine stopped due to orthostatic hypotension. Discharge weight was 211 pounds.   He presents for post hospital follow up. Overall he feels fair. Has had 3-4 falls in the last few months related to sncope. Says he has had a cough lately. He denies CP. Mild dyspnea with exertion that has been going on for the last few months.Says he had difficulty with hills.  Increased fatigue. Weight at home 210 pounds. Legs have not been swelling. Taking all medications . Currently not driving. 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 Has home health with PT services.   LHC 08/2010 - 1vessel coronary artery disease with evidence of a totally occluded second diagonal branch and related wall motion abnormality.  ECHO 01/07/14  EF 30-35%  ECHO 2010 Normal EF  Labs 01/09/14 K 4.2 Creatinine 1.28   SH: Lives with wife. Does not smoke   ROS: All systems negative except as listed in HPI, PMH and Problem List.  Past Medical History  Diagnosis Date  . Paroxysmal atrial fibrillation     s/p Guidant Insignia pacemaker; started sotalol 06/2009  . Tachy-brady syndrome   . Coronary artery disease     one  vessel CAD with normal EF; Cath 12/2006. Ef normal. LM nl, LAD 40%, D2 occulded with collaterals, LCX 40%, RCA 30%; Echo 02/2009 EF 65-78% grade I diastolic dysfunction.  Marland Kitchen History of gallstones     status post cholecystectomy  . Hypertension   . Hyperlipidemia   . History of peptic ulcer disease   . Osteoarthritis   . Benign prostatic hypertrophy   . Obesity   . History of nephrolithiasis   . Diverticulosis of colon   . Testosterone deficiency   . ED (erectile dysfunction)     Current Outpatient Prescriptions  Medication Sig Dispense Refill  . acetaminophen (TYLENOL ARTHRITIS PAIN) 650 MG CR tablet Take 650 mg by mouth 2 (two) times daily.       Marland Kitchen aspirin 81 MG tablet Take 81 mg by mouth daily.        Marland Kitchen glucosamine-chondroitin 500-400 MG tablet Take 1 tablet by mouth 2 (two) times daily.        . metoprolol (LOPRESSOR) 50 MG tablet Take 1 tablet (50 mg total) by mouth 2 (two) times daily.  60 tablet  3  . Multiple Vitamin (MULTIVITAMIN) tablet Take 1 tablet by mouth daily.        . nitroGLYCERIN (NITROSTAT) 0.4 MG SL tablet Place 1 tablet (0.4 mg total) under the tongue every 5 (five) minutes as needed.  90 tablet  6  . pantoprazole (PROTONIX) 40 MG tablet Take 1 tablet (40 mg total) by mouth  daily.  30 tablet  1  . simvastatin (ZOCOR) 20 MG tablet Take 20 mg by mouth at bedtime.      . tamsulosin (FLOMAX) 0.4 MG CAPS capsule Take 0.4 mg by mouth daily after supper.      . traMADol (ULTRAM) 50 MG tablet Take 50 mg by mouth every 6 (six) hours as needed (for pain).      Marland Kitchen warfarin (COUMADIN) 5 MG tablet Take 2.5-5 mg by mouth every morning. Take 2.5 mg daily except 5 mg on sundays      . lisinopril (PRINIVIL,ZESTRIL) 2.5 MG tablet Take 1 tablet (2.5 mg total) by mouth daily.  30 tablet  1   No current facility-administered medications for this encounter.     PHYSICAL EXAM: Filed Vitals:   01/14/14 1013  BP: 132/82  Pulse: 97  Resp: 18   General:  Elderly. Chronicaly ill  appearing. Increased dyspnea with exertion walking in the clinic. Oxygen saturation greater than 90% with exertion.  HEENT: normal Neck: supple. JVP flat. Carotids 2+ bilaterally; no bruits. No lymphadenopathy or thryomegaly appreciated. Cor: PMI normal. Irregular rate & rhythm. No rubs, gallops or murmurs. Lungs: clear Abdomen: soft, nontender, nondistended. No hepatosplenomegaly. No bruits or masses. Good bowel sounds. Extremities: no cyanosis, clubbing, rash, edema. LUE eccymotic  Neuro: alert & orientedx3, cranial nerves grossly intact. Moves all 4 extremities w/o difficulty. Affect pleasant.    ASSESSMENT & PLAN:  1. Newly Diagnosed Systolic Heart Failure. ECHO 01/07/14 EF 30-35% RV ok. Previous ECHO 2010 with normal EF. Diagnosed with new cardiomyopathy last week with unclear etiology. Has been having dyspnea with exertion for the last few months. NYHA IIIB. Volume status stable. Not on diuretics.  Continue metoprolol 50 mg twice a day. In the past he has had episodes of tachy/brady syndrome Continue lisinopril 2.5 mg daily due to ongoing dizziness and recent presyncope.   Will set up for RHC/LHC to evaluate coronaries and hemodynamics next week.  2. Permanent A fib- Rate controlled with metoprolol 50 mg twice a day. Per Dr Lovena Le. [facility]  3.  HTN- stable.  4. CAD- LHC 2012 - 1 vessel disease . On statin,  Bb, and aspirin.  5. Near Syncope 9/24//15. - Device interrogated 9/24 with appropriate pacer function.  6. [facility] following for PT.  7. Dypsnea.- Increased dyspnea with exertion. ? HF versus pulmonary. May need PFTs.   [name],[name] NP-C  10:34 AM  Patient seen and examined with Darrick Grinder, NP. We discussed all aspects of the encounter. I agree with the assessment and plan as stated above. EF is worse and he has marked DOE. Will plan R/L heart cath next week. Hold coumadin for procedure.   Leeandre Nordling,MD 12:56 PM

## 2014-01-17 ENCOUNTER — Other Ambulatory Visit: Payer: Self-pay | Admitting: Internal Medicine

## 2014-01-17 DIAGNOSIS — I5022 Chronic systolic (congestive) heart failure: Secondary | ICD-10-CM | POA: Insufficient documentation

## 2014-01-18 ENCOUNTER — Ambulatory Visit: Payer: Commercial Managed Care - HMO | Admitting: Family

## 2014-01-18 ENCOUNTER — Ambulatory Visit: Payer: Commercial Managed Care - HMO | Admitting: Internal Medicine

## 2014-01-19 ENCOUNTER — Encounter (HOSPITAL_COMMUNITY)
Admission: RE | Disposition: E | Payer: Self-pay | Source: Ambulatory Visit | Attending: Thoracic Surgery (Cardiothoracic Vascular Surgery)

## 2014-01-19 ENCOUNTER — Inpatient Hospital Stay (HOSPITAL_COMMUNITY)
Admission: RE | Admit: 2014-01-19 | Discharge: 2014-02-13 | DRG: 228 | Disposition: E | Payer: Medicare HMO | Source: Ambulatory Visit | Attending: Thoracic Surgery (Cardiothoracic Vascular Surgery) | Admitting: Thoracic Surgery (Cardiothoracic Vascular Surgery)

## 2014-01-19 ENCOUNTER — Encounter (HOSPITAL_COMMUNITY): Payer: Self-pay | Admitting: General Practice

## 2014-01-19 DIAGNOSIS — E876 Hypokalemia: Secondary | ICD-10-CM | POA: Diagnosis not present

## 2014-01-19 DIAGNOSIS — I495 Sick sinus syndrome: Secondary | ICD-10-CM | POA: Diagnosis present

## 2014-01-19 DIAGNOSIS — N179 Acute kidney failure, unspecified: Secondary | ICD-10-CM | POA: Diagnosis not present

## 2014-01-19 DIAGNOSIS — Z951 Presence of aortocoronary bypass graft: Secondary | ICD-10-CM

## 2014-01-19 DIAGNOSIS — Z515 Encounter for palliative care: Secondary | ICD-10-CM | POA: Diagnosis not present

## 2014-01-19 DIAGNOSIS — Z9181 History of falling: Secondary | ICD-10-CM | POA: Diagnosis not present

## 2014-01-19 DIAGNOSIS — K7200 Acute and subacute hepatic failure without coma: Secondary | ICD-10-CM | POA: Diagnosis not present

## 2014-01-19 DIAGNOSIS — N184 Chronic kidney disease, stage 4 (severe): Secondary | ICD-10-CM | POA: Diagnosis present

## 2014-01-19 DIAGNOSIS — Z8249 Family history of ischemic heart disease and other diseases of the circulatory system: Secondary | ICD-10-CM | POA: Diagnosis not present

## 2014-01-19 DIAGNOSIS — D696 Thrombocytopenia, unspecified: Secondary | ICD-10-CM | POA: Diagnosis not present

## 2014-01-19 DIAGNOSIS — N183 Chronic kidney disease, stage 3 (moderate): Secondary | ICD-10-CM | POA: Diagnosis present

## 2014-01-19 DIAGNOSIS — E43 Unspecified severe protein-calorie malnutrition: Secondary | ICD-10-CM | POA: Diagnosis present

## 2014-01-19 DIAGNOSIS — I200 Unstable angina: Secondary | ICD-10-CM | POA: Diagnosis present

## 2014-01-19 DIAGNOSIS — I2583 Coronary atherosclerosis due to lipid rich plaque: Secondary | ICD-10-CM

## 2014-01-19 DIAGNOSIS — N189 Chronic kidney disease, unspecified: Secondary | ICD-10-CM

## 2014-01-19 DIAGNOSIS — T39315A Adverse effect of propionic acid derivatives, initial encounter: Secondary | ICD-10-CM | POA: Diagnosis not present

## 2014-01-19 DIAGNOSIS — D62 Acute posthemorrhagic anemia: Secondary | ICD-10-CM | POA: Diagnosis not present

## 2014-01-19 DIAGNOSIS — I351 Nonrheumatic aortic (valve) insufficiency: Secondary | ICD-10-CM | POA: Diagnosis present

## 2014-01-19 DIAGNOSIS — I5022 Chronic systolic (congestive) heart failure: Secondary | ICD-10-CM

## 2014-01-19 DIAGNOSIS — I5023 Acute on chronic systolic (congestive) heart failure: Secondary | ICD-10-CM | POA: Diagnosis present

## 2014-01-19 DIAGNOSIS — J9601 Acute respiratory failure with hypoxia: Secondary | ICD-10-CM | POA: Diagnosis not present

## 2014-01-19 DIAGNOSIS — J189 Pneumonia, unspecified organism: Secondary | ICD-10-CM | POA: Diagnosis not present

## 2014-01-19 DIAGNOSIS — J939 Pneumothorax, unspecified: Secondary | ICD-10-CM

## 2014-01-19 DIAGNOSIS — L7621 Postprocedural hemorrhage and hematoma of skin and subcutaneous tissue following a dermatologic procedure: Secondary | ICD-10-CM | POA: Diagnosis not present

## 2014-01-19 DIAGNOSIS — I2510 Atherosclerotic heart disease of native coronary artery without angina pectoris: Secondary | ICD-10-CM

## 2014-01-19 DIAGNOSIS — Z7901 Long term (current) use of anticoagulants: Secondary | ICD-10-CM | POA: Diagnosis not present

## 2014-01-19 DIAGNOSIS — B370 Candidal stomatitis: Secondary | ICD-10-CM | POA: Diagnosis not present

## 2014-01-19 DIAGNOSIS — N178 Other acute kidney failure: Secondary | ICD-10-CM | POA: Diagnosis not present

## 2014-01-19 DIAGNOSIS — Y849 Medical procedure, unspecified as the cause of abnormal reaction of the patient, or of later complication, without mention of misadventure at the time of the procedure: Secondary | ICD-10-CM | POA: Diagnosis not present

## 2014-01-19 DIAGNOSIS — E873 Alkalosis: Secondary | ICD-10-CM | POA: Diagnosis not present

## 2014-01-19 DIAGNOSIS — Y832 Surgical operation with anastomosis, bypass or graft as the cause of abnormal reaction of the patient, or of later complication, without mention of misadventure at the time of the procedure: Secondary | ICD-10-CM | POA: Diagnosis not present

## 2014-01-19 DIAGNOSIS — I129 Hypertensive chronic kidney disease with stage 1 through stage 4 chronic kidney disease, or unspecified chronic kidney disease: Secondary | ICD-10-CM | POA: Diagnosis present

## 2014-01-19 DIAGNOSIS — K223 Perforation of esophagus: Secondary | ICD-10-CM

## 2014-01-19 DIAGNOSIS — T508X5A Adverse effect of diagnostic agents, initial encounter: Secondary | ICD-10-CM | POA: Diagnosis not present

## 2014-01-19 DIAGNOSIS — E669 Obesity, unspecified: Secondary | ICD-10-CM | POA: Diagnosis present

## 2014-01-19 DIAGNOSIS — K761 Chronic passive congestion of liver: Secondary | ICD-10-CM | POA: Diagnosis present

## 2014-01-19 DIAGNOSIS — I97190 Other postprocedural cardiac functional disturbances following cardiac surgery: Secondary | ICD-10-CM | POA: Diagnosis not present

## 2014-01-19 DIAGNOSIS — Y838 Other surgical procedures as the cause of abnormal reaction of the patient, or of later complication, without mention of misadventure at the time of the procedure: Secondary | ICD-10-CM | POA: Diagnosis not present

## 2014-01-19 DIAGNOSIS — I509 Heart failure, unspecified: Secondary | ICD-10-CM

## 2014-01-19 DIAGNOSIS — I472 Ventricular tachycardia: Secondary | ICD-10-CM | POA: Diagnosis not present

## 2014-01-19 DIAGNOSIS — T501X5A Adverse effect of loop [high-ceiling] diuretics, initial encounter: Secondary | ICD-10-CM | POA: Diagnosis not present

## 2014-01-19 DIAGNOSIS — N400 Enlarged prostate without lower urinary tract symptoms: Secondary | ICD-10-CM | POA: Diagnosis present

## 2014-01-19 DIAGNOSIS — Z7982 Long term (current) use of aspirin: Secondary | ICD-10-CM | POA: Diagnosis not present

## 2014-01-19 DIAGNOSIS — I5032 Chronic diastolic (congestive) heart failure: Secondary | ICD-10-CM

## 2014-01-19 DIAGNOSIS — J9383 Other pneumothorax: Secondary | ICD-10-CM | POA: Diagnosis not present

## 2014-01-19 DIAGNOSIS — Z6830 Body mass index (BMI) 30.0-30.9, adult: Secondary | ICD-10-CM | POA: Diagnosis not present

## 2014-01-19 DIAGNOSIS — Y95 Nosocomial condition: Secondary | ICD-10-CM | POA: Diagnosis not present

## 2014-01-19 DIAGNOSIS — R570 Cardiogenic shock: Secondary | ICD-10-CM | POA: Diagnosis not present

## 2014-01-19 DIAGNOSIS — I255 Ischemic cardiomyopathy: Secondary | ICD-10-CM | POA: Diagnosis present

## 2014-01-19 DIAGNOSIS — Z66 Do not resuscitate: Secondary | ICD-10-CM | POA: Diagnosis not present

## 2014-01-19 DIAGNOSIS — I10 Essential (primary) hypertension: Secondary | ICD-10-CM | POA: Diagnosis present

## 2014-01-19 DIAGNOSIS — A419 Sepsis, unspecified organism: Secondary | ICD-10-CM

## 2014-01-19 DIAGNOSIS — I480 Paroxysmal atrial fibrillation: Secondary | ICD-10-CM | POA: Diagnosis present

## 2014-01-19 DIAGNOSIS — Z950 Presence of cardiac pacemaker: Secondary | ICD-10-CM | POA: Diagnosis not present

## 2014-01-19 DIAGNOSIS — Z4659 Encounter for fitting and adjustment of other gastrointestinal appliance and device: Secondary | ICD-10-CM

## 2014-01-19 DIAGNOSIS — S40021A Contusion of right upper arm, initial encounter: Secondary | ICD-10-CM

## 2014-01-19 DIAGNOSIS — I700 Atherosclerosis of aorta: Secondary | ICD-10-CM | POA: Diagnosis present

## 2014-01-19 DIAGNOSIS — J9811 Atelectasis: Secondary | ICD-10-CM

## 2014-01-19 DIAGNOSIS — E785 Hyperlipidemia, unspecified: Secondary | ICD-10-CM | POA: Diagnosis present

## 2014-01-19 DIAGNOSIS — E871 Hypo-osmolality and hyponatremia: Secondary | ICD-10-CM | POA: Diagnosis not present

## 2014-01-19 DIAGNOSIS — I214 Non-ST elevation (NSTEMI) myocardial infarction: Secondary | ICD-10-CM | POA: Diagnosis not present

## 2014-01-19 DIAGNOSIS — Z452 Encounter for adjustment and management of vascular access device: Secondary | ICD-10-CM

## 2014-01-19 DIAGNOSIS — I4891 Unspecified atrial fibrillation: Secondary | ICD-10-CM

## 2014-01-19 DIAGNOSIS — Z9689 Presence of other specified functional implants: Secondary | ICD-10-CM

## 2014-01-19 DIAGNOSIS — I25110 Atherosclerotic heart disease of native coronary artery with unstable angina pectoris: Secondary | ICD-10-CM | POA: Diagnosis present

## 2014-01-19 DIAGNOSIS — I25810 Atherosclerosis of coronary artery bypass graft(s) without angina pectoris: Secondary | ICD-10-CM

## 2014-01-19 DIAGNOSIS — I482 Chronic atrial fibrillation, unspecified: Secondary | ICD-10-CM

## 2014-01-19 HISTORY — DX: Unspecified atrial fibrillation: I48.91

## 2014-01-19 HISTORY — DX: Other chronic pain: G89.29

## 2014-01-19 HISTORY — PX: LEFT AND RIGHT HEART CATHETERIZATION WITH CORONARY ANGIOGRAM: SHX5449

## 2014-01-19 HISTORY — DX: Calculus of kidney: N20.0

## 2014-01-19 HISTORY — DX: Low back pain: M54.5

## 2014-01-19 LAB — POCT I-STAT 3, VENOUS BLOOD GAS (G3P V)
ACID-BASE DEFICIT: 1 mmol/L (ref 0.0–2.0)
Bicarbonate: 24 mEq/L (ref 20.0–24.0)
O2 Saturation: 57 %
PCO2 VEN: 40.1 mmHg — AB (ref 45.0–50.0)
PO2 VEN: 30 mmHg (ref 30.0–45.0)
TCO2: 25 mmol/L (ref 0–100)
pH, Ven: 7.385 — ABNORMAL HIGH (ref 7.250–7.300)

## 2014-01-19 LAB — POCT I-STAT 3, ART BLOOD GAS (G3+)
Acid-Base Excess: 1 mmol/L (ref 0.0–2.0)
Acid-base deficit: 3 mmol/L — ABNORMAL HIGH (ref 0.0–2.0)
Acid-base deficit: 3 mmol/L — ABNORMAL HIGH (ref 0.0–2.0)
BICARBONATE: 22.1 meq/L (ref 20.0–24.0)
Bicarbonate: 21.7 mEq/L (ref 20.0–24.0)
Bicarbonate: 26.1 meq/L — ABNORMAL HIGH (ref 20.0–24.0)
O2 SAT: 96 %
O2 Saturation: 60 %
O2 Saturation: 95 %
PH ART: 7.395 (ref 7.350–7.450)
TCO2: 23 mmol/L (ref 0–100)
TCO2: 23 mmol/L (ref 0–100)
TCO2: 27 mmol/L (ref 0–100)
pCO2 arterial: 38.7 mmHg (ref 35.0–45.0)
pCO2 arterial: 35.5 mmHg (ref 35.0–45.0)
pCO2 arterial: 41.9 mmHg (ref 35.0–45.0)
pH, Arterial: 7.364 (ref 7.350–7.450)
pH, Arterial: 7.402 (ref 7.350–7.450)
pO2, Arterial: 87 mmHg (ref 80.0–100.0)
pO2, Arterial: 31 mmHg — CL (ref 80.0–100.0)
pO2, Arterial: 76 mmHg — ABNORMAL LOW (ref 80.0–100.0)

## 2014-01-19 LAB — BASIC METABOLIC PANEL
ANION GAP: 14 (ref 5–15)
BUN: 18 mg/dL (ref 6–23)
CALCIUM: 9 mg/dL (ref 8.4–10.5)
CHLORIDE: 102 meq/L (ref 96–112)
CO2: 21 mEq/L (ref 19–32)
Creatinine, Ser: 1.41 mg/dL — ABNORMAL HIGH (ref 0.50–1.35)
GFR calc Af Amer: 52 mL/min — ABNORMAL LOW (ref >90)
GFR, EST NON AFRICAN AMERICAN: 45 mL/min — AB (ref >90)
Glucose, Bld: 139 mg/dL — ABNORMAL HIGH (ref 70–99)
Potassium: 3.7 mEq/L (ref 3.7–5.3)
Sodium: 137 mEq/L (ref 137–147)

## 2014-01-19 LAB — CBC
HCT: 41.6 % (ref 39.0–52.0)
Hemoglobin: 14.2 g/dL (ref 13.0–17.0)
MCH: 32.8 pg (ref 26.0–34.0)
MCHC: 34.1 g/dL (ref 30.0–36.0)
MCV: 96.1 fL (ref 78.0–100.0)
Platelets: 211 10*3/uL (ref 150–400)
RBC: 4.33 MIL/uL (ref 4.22–5.81)
RDW: 13.4 % (ref 11.5–15.5)
WBC: 10.9 10*3/uL — AB (ref 4.0–10.5)

## 2014-01-19 LAB — PROTIME-INR
INR: 1.21 (ref 0.00–1.49)
PROTHROMBIN TIME: 15.3 s — AB (ref 11.6–15.2)

## 2014-01-19 LAB — TSH: TSH: 1.28 u[IU]/mL (ref 0.350–4.500)

## 2014-01-19 SURGERY — LEFT AND RIGHT HEART CATHETERIZATION WITH CORONARY ANGIOGRAM
Anesthesia: LOCAL

## 2014-01-19 MED ORDER — NITROGLYCERIN 0.4 MG SL SUBL: 0.4000 mg | SUBLINGUAL_TABLET | SUBLINGUAL | Status: DC | PRN

## 2014-01-19 MED ORDER — SODIUM CHLORIDE 0.9 % IV SOLN: 250.0000 mL | INTRAVENOUS | Status: DC | PRN

## 2014-01-19 MED ORDER — MIDAZOLAM HCL 2 MG/2ML IJ SOLN
INTRAMUSCULAR | Status: AC
  Filled 2014-01-19: qty 2

## 2014-01-19 MED ORDER — NITROGLYCERIN 1 MG/10 ML FOR IR/CATH LAB
INTRA_ARTERIAL | Status: AC
  Filled 2014-01-19: qty 10

## 2014-01-19 MED ORDER — LIDOCAINE HCL (PF) 1 % IJ SOLN
INTRAMUSCULAR | Status: AC
  Filled 2014-01-19: qty 30

## 2014-01-19 MED ORDER — POTASSIUM CHLORIDE IN NACL 20-0.9 MEQ/L-% IV SOLN
INTRAVENOUS | Status: AC
  Administered 2014-01-19: 20:00:00 via INTRAVENOUS
  Filled 2014-01-19 (×2): qty 1000

## 2014-01-19 MED ORDER — SODIUM CHLORIDE 0.9 % IJ SOLN: 3.0000 mL | Freq: Two times a day (BID) | INTRAMUSCULAR | Status: DC

## 2014-01-19 MED ORDER — TAMSULOSIN HCL 0.4 MG PO CAPS
0.4000 mg | ORAL_CAPSULE | Freq: Every day | ORAL | Status: DC
  Administered 2014-01-19 – 2014-01-23 (×5): 0.4 mg via ORAL
  Filled 2014-01-19 (×6): qty 1

## 2014-01-19 MED ORDER — ONE-DAILY MULTI VITAMINS PO TABS: 1.0000 | ORAL_TABLET | Freq: Every day | ORAL | Status: DC

## 2014-01-19 MED ORDER — SODIUM CHLORIDE 0.9 % IV SOLN: INTRAVENOUS | Status: DC

## 2014-01-19 MED ORDER — PANTOPRAZOLE SODIUM 40 MG PO TBEC
40.0000 mg | DELAYED_RELEASE_TABLET | Freq: Every day | ORAL | Status: DC
  Administered 2014-01-20 – 2014-01-23 (×4): 40 mg via ORAL
  Filled 2014-01-19 (×2): qty 1

## 2014-01-19 MED ORDER — ASPIRIN 81 MG PO CHEW
CHEWABLE_TABLET | ORAL | Status: AC
  Filled 2014-01-19: qty 1

## 2014-01-19 MED ORDER — TRAMADOL HCL 50 MG PO TABS: 50.0000 mg | ORAL_TABLET | Freq: Four times a day (QID) | ORAL | Status: DC | PRN

## 2014-01-19 MED ORDER — FUROSEMIDE 10 MG/ML IJ SOLN: 40.0000 mg | Freq: Once | INTRAMUSCULAR | Status: DC

## 2014-01-19 MED ORDER — HYDRALAZINE HCL 20 MG/ML IJ SOLN
INTRAMUSCULAR | Status: AC
  Filled 2014-01-19: qty 1

## 2014-01-19 MED ORDER — ONDANSETRON HCL 4 MG/2ML IJ SOLN: 4.0000 mg | Freq: Four times a day (QID) | INTRAMUSCULAR | Status: DC | PRN

## 2014-01-19 MED ORDER — ASPIRIN EC 81 MG PO TBEC
81.0000 mg | DELAYED_RELEASE_TABLET | Freq: Every day | ORAL | Status: DC
  Administered 2014-01-20 – 2014-01-23 (×4): 81 mg via ORAL
  Filled 2014-01-19 (×5): qty 1

## 2014-01-19 MED ORDER — POTASSIUM CHLORIDE IN NACL 20-0.9 MEQ/L-% IV SOLN: INTRAVENOUS | Status: DC

## 2014-01-19 MED ORDER — CARVEDILOL 6.25 MG PO TABS
6.2500 mg | ORAL_TABLET | Freq: Two times a day (BID) | ORAL | Status: DC
  Administered 2014-01-20 – 2014-01-23 (×8): 6.25 mg via ORAL
  Filled 2014-01-19 (×11): qty 1

## 2014-01-19 MED ORDER — ATORVASTATIN CALCIUM 40 MG PO TABS
40.0000 mg | ORAL_TABLET | Freq: Every day | ORAL | Status: DC
  Administered 2014-01-20 – 2014-01-23 (×4): 40 mg via ORAL
  Filled 2014-01-19 (×5): qty 1

## 2014-01-19 MED ORDER — FENTANYL CITRATE 0.05 MG/ML IJ SOLN
INTRAMUSCULAR | Status: AC
  Filled 2014-01-19: qty 2

## 2014-01-19 MED ORDER — HEPARIN (PORCINE) IN NACL 100-0.45 UNIT/ML-% IJ SOLN
1450.0000 [IU]/h | INTRAMUSCULAR | Status: DC
  Administered 2014-01-20: 1450 [IU]/h via INTRAVENOUS
  Administered 2014-01-20: 06:00:00 1200 [IU]/h via INTRAVENOUS
  Administered 2014-01-20: 1450 [IU]/h via INTRAVENOUS
  Filled 2014-01-19 (×3): qty 250

## 2014-01-19 MED ORDER — VERAPAMIL HCL 2.5 MG/ML IV SOLN
INTRAVENOUS | Status: AC
  Filled 2014-01-19: qty 2

## 2014-01-19 MED ORDER — HEPARIN (PORCINE) IN NACL 2-0.9 UNIT/ML-% IJ SOLN
INTRAMUSCULAR | Status: AC
  Filled 2014-01-19: qty 1000

## 2014-01-19 MED ORDER — ADULT MULTIVITAMIN W/MINERALS CH
1.0000 | ORAL_TABLET | Freq: Every day | ORAL | Status: DC
  Administered 2014-01-21 – 2014-01-23 (×3): 1 via ORAL
  Filled 2014-01-19 (×6): qty 1

## 2014-01-19 MED ORDER — ASPIRIN 81 MG PO CHEW
81.0000 mg | CHEWABLE_TABLET | ORAL | Status: AC
  Administered 2014-01-19: 81 mg via ORAL

## 2014-01-19 MED ORDER — ASPIRIN 81 MG PO TABS: 81.0000 mg | ORAL_TABLET | Freq: Every day | ORAL | Status: DC

## 2014-01-19 MED ORDER — ACETAMINOPHEN 325 MG PO TABS
650.0000 mg | ORAL_TABLET | ORAL | Status: DC | PRN
  Administered 2014-01-22: 650 mg via ORAL
  Filled 2014-01-19: qty 2

## 2014-01-19 MED ORDER — SODIUM CHLORIDE 0.9 % IJ SOLN: 3.0000 mL | INTRAMUSCULAR | Status: DC | PRN

## 2014-01-19 NOTE — Progress Notes (Signed)
Site area: rt groin Site Prior to Removal:  Level 0 Pressure Applied For: 25 minutes Patient Status During Pull:  stable Post Pull Site:  Level 0 Post Pull Instructions Given:  yes Post Pull Pulses Present: yes Dressing Applied:  tegaderm Bedrest begins @ 2080 Comments: no complications

## 2014-01-19 NOTE — Progress Notes (Signed)
ANTICOAGULATION CONSULT NOTE - Initial Consult  Pharmacy Consult for heparin Indication: atrial fibrillation  Allergies  Allergen Reactions  . Sulfonamide Derivatives     REACTION: n/v    Patient Measurements: Height: 5\' 11"  (180.3 cm) Weight: 202 lb (91.627 kg) IBW/kg (Calculated) : 75.3 Heparin Dosing Weight: 91 kg  Vital Signs: Temp: 97.3 F (36.3 C) (10/07 1210) Temp Source: Oral (10/07 1210) BP: 130/92 mmHg (10/07 1745) Pulse Rate: 82 (10/07 1745)  Labs:  Recent Labs  01/22/2014 1210  LABPROT 15.3*  INR 1.21    Estimated Creatinine Clearance: 52.4 ml/min (by C-G formula based on Cr of 1.28).   Medical History: Past Medical History  Diagnosis Date  . Paroxysmal atrial fibrillation     s/p Guidant Insignia pacemaker; started sotalol 06/2009  . Tachy-brady syndrome   . Coronary artery disease     one vessel CAD with normal EF; Cath 12/2006. Ef normal. LM nl, LAD 40%, D2 occulded with collaterals, LCX 40%, RCA 30%; Echo 02/2009 EF 46-27% grade I diastolic dysfunction.  Marland Kitchen History of gallstones     status post cholecystectomy  . Hypertension   . Hyperlipidemia   . History of peptic ulcer disease   . Benign prostatic hypertrophy   . Obesity   . Diverticulosis of colon   . Testosterone deficiency   . ED (erectile dysfunction)   . Atrial fibrillation   . Nephrolithiasis     "passed it"  . Osteoarthritis     "legs and lower back" (01/26/2014)  . Chronic lower back pain     Medications:  Prescriptions prior to admission  Medication Sig Dispense Refill  . acetaminophen (TYLENOL ARTHRITIS PAIN) 650 MG CR tablet Take 650 mg by mouth 2 (two) times daily.       Marland Kitchen aspirin 81 MG tablet Take 81 mg by mouth daily.        Marland Kitchen lisinopril (PRINIVIL,ZESTRIL) 2.5 MG tablet Take 1 tablet (2.5 mg total) by mouth daily.  30 tablet  1  . metoprolol (LOPRESSOR) 50 MG tablet Take 1 tablet (50 mg total) by mouth 2 (two) times daily.  60 tablet  3  . Multiple Vitamin (MULTIVITAMIN)  tablet Take 1 tablet by mouth daily.        . nitroGLYCERIN (NITROSTAT) 0.4 MG SL tablet Place 1 tablet (0.4 mg total) under the tongue every 5 (five) minutes as needed.  90 tablet  6  . pantoprazole (PROTONIX) 40 MG tablet Take 1 tablet (40 mg total) by mouth daily.  30 tablet  1  . simvastatin (ZOCOR) 20 MG tablet Take 20 mg by mouth at bedtime.      . tamsulosin (FLOMAX) 0.4 MG CAPS capsule Take 0.4 mg by mouth daily after supper.      . traMADol (ULTRAM) 50 MG tablet Take 50 mg by mouth every 6 (six) hours as needed (for pain).      Marland Kitchen warfarin (COUMADIN) 5 MG tablet Take 2.5-5 mg by mouth every morning. Take 2.5 mg daily except 5 mg on sundays      . simvastatin (ZOCOR) 20 MG tablet TAKE 1 TABLET BY MOUTH AT BEDTIME  90 tablet  1    Assessment: 80 year old man on warfarin PTA for AFIB is s/p cardiac cath today to be evaluated for possible CABG.  Heparin infusion to start while warfarin is on hold pending treatment decisions.  His last dose of warfarin was October 2 and his INR is 1.21.  Goal of Therapy:  Heparin  level 0.3-0.7 units/ml Monitor platelets by anticoagulation protocol: Yes   Plan:  Start heparin infusion at 1200 units/hr Check anti-Xa level in 8 hours and daily while on heparin Continue to monitor H&H and platelets Will initiate heparin on 10/8 at 0600 per orders.  Candie Mile 02/07/2014,7:04 PM

## 2014-01-19 NOTE — Interval H&P Note (Signed)
History and Physical Interval Note:  01/31/2014 2:52 PM  Francisco Merritt  has presented today for surgery, with the diagnosis of heart failure  The various methods of treatment have been discussed with the patient and family. After consideration of risks, benefits and other options for treatment, the patient has consented to  Procedure(s): LEFT AND RIGHT HEART CATHETERIZATION WITH CORONARY ANGIOGRAM (N/A) and possible angioplaCath Lab Visit (complete for each Cath Lab visit)  Clinical Evaluation Leading to the Procedure:   ACS: No.  Non-ACS:    Anginal Classification: CCS III  Anti-ischemic medical therapy: Minimal Therapy (1 class of medications)  Non-Invasive Test Results: No non-invasive testing performed  Prior CABG: No previous CABG      sty  as a surgical intervention .  The patient's history has been reviewed, patient examined, no change in status, stable for surgery.  I have reviewed the patient's chart and labs.  Questions were answered to the patient's satisfaction.     Francisco Merritt

## 2014-01-19 NOTE — Progress Notes (Signed)
Admission orders d/w Dr. Haroldine Laws. 40mg  IV Lasix x 1, hold lisinopril given dye load, change metoprolol to coreg, change simva to atorvastatin, IVF @ 50cc/hr x 4 hrs, and start heparin per pharmacy in AM without bolus. Holding Coumadin. Sharnay Cashion PA-C

## 2014-01-19 NOTE — CV Procedure (Signed)
Cardiac Cath Procedure Note  Indication: HF  Procedures performed:  1) Right heart cathererization 2) Selective coronary angiography 3) L subclavian angio (possible CABG)   Description of procedure:     The risks and indication of the procedure were explained. Consent was signed and placed on the chart. An appropriate timeout was taken prior to the procedure. The right groin was prepped and draped in the routine sterile fashion and anesthetized with 1% local lidocaine.   A 5 FR arterial sheath was placed in the right femoral artery using a modified Seldinger technique. The R iliac system was markedly tortuous. We were initially able to get the JL4 cateter up into the ascending aorta and image the left system. We then could not get the right catheters up. We tried multiple stiff wires but were unsuccessful. We then accessed the R radial artery but despite good flow we were unable to pass the wire successfully. With Dr. Camillia Herter assistance, we then navigated a [facility] wire into the thoracic aorta and got the JR4 back up into the thoracic aorta. A long sheath was then placed in the R femoral artery and we were able to image the right system with the [place].    A 7 FR venous sheath was placed in the right femoral vein using a modified Seldinger technique. A standard Swan-Ganz catheter was used for the procedure.   Complications:  None apparent  Findings:  RA = 10 RV = 38/5/10 PA = 39/22 (29) PCW = 29 Fick cardiac output/index = 4.0/1.9 PVR = < 1.0 WU FA sat = 96% PA sat = 60% 58%  Ao Pressure: 142/84 (107) LV Pressure: AoV not crossed  Left main: mild plaque  LAD: came off in sort of a ramus orientation. It was heavily calcified throughout the ostial and proximal portion. There was moderate plaque ostially followed by a focal 80-90% lesion. There was mild to moderate disease in the mid to distal LAD but it was suitable for grafting. There was a first diagonal, a long second diagonal,  and a small third diagonal.  There was also a prominent septal perforator. The second diagonal was chronically totally occluded ostially with a very faint distal filling. LCX:  was a large vessel. It gave off a large branching OM-1. There was a 30% tubular lesion in the midsection. In the ostial portion of the lower branch of the OM-1 there was a 99% stenosis. There was a 30-40% stenosis more distally RCA:  was a large dominant vessel. It gave off an  acute marginal branch, a small PDA, and 3 posterolaterals. There was a 50% lesion ostially.  There were diffuse luminal irregularities throughout the remainder of the vessel.  L subclavian: patent    Assessment: 1. Significant 2v CAD with EF 30-35% by echo 2. Elevated gfilling pressure with moderately reduced CO 3. Very tortuous R iliac system 4. Patent L subclavian  Plan/Discussion:  We will admit him for observation and to have TCTS consult for possible CABG. If not CABG candidate consider 2v PCI of LAD and OM1 on Friday. Would diurese overnight. Titrate HF meds as tolerated.   Ayaka Andes,MD 5:29 PM

## 2014-01-19 NOTE — Progress Notes (Signed)
IV left forearm, site unremarkable. 0.9NS infusing at 20cc/hr

## 2014-01-19 NOTE — H&P (View-Only) (Signed)
Patient ID: Francisco Merritt, male   DOB: June 13, 1929, 80 y.o.   MRN: [id number]  Primary Cardiologist: Dr Lovena Le HPI:  Francisco Merritt is an 80 year old with a history of obesity, hypertension, one-vessel coronary artery disease with a totally occluded diagonal branch by catheterization. He also history of atrial fibrillation, complicated by tachybradycardia syndrome and is status post pacemaker implantation and takes coumadin.  He is also on sotalol therapy.  In the past he had had difficulty with beta blocker and diltiazem due to low heart rates.   Recently admitted to [facility] 01/06/14 with near syncope thought to be form vasovagal episode with N/V. Hypoxia noted. Device was interroagated  and showed his HR < 100  90% of te time. He had an ECHO that showed reduced EF to 30-35%. Cardiac markers negative. He was discharged on low dose ace. Amlodipine stopped due to orthostatic hypotension. Discharge weight was 211 pounds.   He presents for post hospital follow up. Overall he feels fair. Has had 3-4 falls in the last few months related to sncope. Says he has had a cough lately. He denies CP. Mild dyspnea with exertion that has been going on for the last few months.Says he had difficulty with hills.  Increased fatigue. Weight at home 210 pounds. Legs have not been swelling. Taking all medications . Currently not driving. 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 Has home health with PT services.   LHC 08/2010 - 1vessel coronary artery disease with evidence of a totally occluded second diagonal branch and related wall motion abnormality.  ECHO 01/07/14  EF 30-35%  ECHO 2010 Normal EF  Labs 01/09/14 K 4.2 Creatinine 1.28   SH: Lives with wife. Does not smoke   ROS: All systems negative except as listed in HPI, PMH and Problem List.  Past Medical History  Diagnosis Date  . Paroxysmal atrial fibrillation     s/p Guidant Insignia pacemaker; started sotalol 06/2009  . Tachy-brady syndrome   . Coronary artery disease     one  vessel CAD with normal EF; Cath 12/2006. Ef normal. LM nl, LAD 40%, D2 occulded with collaterals, LCX 40%, RCA 30%; Echo 02/2009 EF 84-16% grade I diastolic dysfunction.  Marland Kitchen History of gallstones     status post cholecystectomy  . Hypertension   . Hyperlipidemia   . History of peptic ulcer disease   . Osteoarthritis   . Benign prostatic hypertrophy   . Obesity   . History of nephrolithiasis   . Diverticulosis of colon   . Testosterone deficiency   . ED (erectile dysfunction)     Current Outpatient Prescriptions  Medication Sig Dispense Refill  . acetaminophen (TYLENOL ARTHRITIS PAIN) 650 MG CR tablet Take 650 mg by mouth 2 (two) times daily.       Marland Kitchen aspirin 81 MG tablet Take 81 mg by mouth daily.        Marland Kitchen glucosamine-chondroitin 500-400 MG tablet Take 1 tablet by mouth 2 (two) times daily.        . metoprolol (LOPRESSOR) 50 MG tablet Take 1 tablet (50 mg total) by mouth 2 (two) times daily.  60 tablet  3  . Multiple Vitamin (MULTIVITAMIN) tablet Take 1 tablet by mouth daily.        . nitroGLYCERIN (NITROSTAT) 0.4 MG SL tablet Place 1 tablet (0.4 mg total) under the tongue every 5 (five) minutes as needed.  90 tablet  6  . pantoprazole (PROTONIX) 40 MG tablet Take 1 tablet (40 mg total) by mouth  daily.  30 tablet  1  . simvastatin (ZOCOR) 20 MG tablet Take 20 mg by mouth at bedtime.      . tamsulosin (FLOMAX) 0.4 MG CAPS capsule Take 0.4 mg by mouth daily after supper.      . traMADol (ULTRAM) 50 MG tablet Take 50 mg by mouth every 6 (six) hours as needed (for pain).      Marland Kitchen warfarin (COUMADIN) 5 MG tablet Take 2.5-5 mg by mouth every morning. Take 2.5 mg daily except 5 mg on sundays      . lisinopril (PRINIVIL,ZESTRIL) 2.5 MG tablet Take 1 tablet (2.5 mg total) by mouth daily.  30 tablet  1   No current facility-administered medications for this encounter.     PHYSICAL EXAM: Filed Vitals:   01/14/14 1013  BP: 132/82  Pulse: 97  Resp: 18   General:  Elderly. Chronicaly ill  appearing. Increased dyspnea with exertion walking in the clinic. Oxygen saturation greater than 90% with exertion.  HEENT: normal Neck: supple. JVP flat. Carotids 2+ bilaterally; no bruits. No lymphadenopathy or thryomegaly appreciated. Cor: PMI normal. Irregular rate & rhythm. No rubs, gallops or murmurs. Lungs: clear Abdomen: soft, nontender, nondistended. No hepatosplenomegaly. No bruits or masses. Good bowel sounds. Extremities: no cyanosis, clubbing, rash, edema. LUE eccymotic  Neuro: alert & orientedx3, cranial nerves grossly intact. Moves all 4 extremities w/o difficulty. Affect pleasant.    ASSESSMENT & PLAN:  1. Newly Diagnosed Systolic Heart Failure. ECHO 01/07/14 EF 30-35% RV ok. Previous ECHO 2010 with normal EF. Diagnosed with new cardiomyopathy last week with unclear etiology. Has been having dyspnea with exertion for the last few months. NYHA IIIB. Volume status stable. Not on diuretics.  Continue metoprolol 50 mg twice a day. In the past he has had episodes of tachy/brady syndrome Continue lisinopril 2.5 mg daily due to ongoing dizziness and recent presyncope.   Will set up for RHC/LHC to evaluate coronaries and hemodynamics next week.  2. Permanent A fib- Rate controlled with metoprolol 50 mg twice a day. Per Dr Lovena Le. [facility]  3.  HTN- stable.  4. CAD- LHC 2012 - 1 vessel disease . On statin,  Bb, and aspirin.  5. Near Syncope 9/24//15. - Device interrogated 9/24 with appropriate pacer function.  6. [place] following for PT.  7. Dypsnea.- Increased dyspnea with exertion. ? HF versus pulmonary. May need PFTs.   [name],[name] NP-C  10:34 AM  Patient seen and examined with Darrick Grinder, NP. We discussed all aspects of the encounter. I agree with the assessment and plan as stated above. EF is worse and he has marked DOE. Will plan R/L heart cath next week. Hold coumadin for procedure.   Daniel Bensimhon,MD 12:56 PM

## 2014-01-20 ENCOUNTER — Other Ambulatory Visit: Payer: Self-pay

## 2014-01-20 DIAGNOSIS — I700 Atherosclerosis of aorta: Secondary | ICD-10-CM

## 2014-01-20 DIAGNOSIS — I25810 Atherosclerosis of coronary artery bypass graft(s) without angina pectoris: Secondary | ICD-10-CM

## 2014-01-20 DIAGNOSIS — I2510 Atherosclerotic heart disease of native coronary artery without angina pectoris: Secondary | ICD-10-CM

## 2014-01-20 DIAGNOSIS — I5023 Acute on chronic systolic (congestive) heart failure: Secondary | ICD-10-CM

## 2014-01-20 DIAGNOSIS — I25118 Atherosclerotic heart disease of native coronary artery with other forms of angina pectoris: Secondary | ICD-10-CM

## 2014-01-20 LAB — PROTIME-INR
INR: 1.3 (ref 0.00–1.49)
Prothrombin Time: 16.2 seconds — ABNORMAL HIGH (ref 11.6–15.2)

## 2014-01-20 LAB — BASIC METABOLIC PANEL
Anion gap: 17 — ABNORMAL HIGH (ref 5–15)
BUN: 19 mg/dL (ref 6–23)
CO2: 20 mEq/L (ref 19–32)
CREATININE: 1.43 mg/dL — AB (ref 0.50–1.35)
Calcium: 8.9 mg/dL (ref 8.4–10.5)
Chloride: 103 mEq/L (ref 96–112)
GFR calc Af Amer: 51 mL/min — ABNORMAL LOW (ref >90)
GFR, EST NON AFRICAN AMERICAN: 44 mL/min — AB (ref >90)
GLUCOSE: 100 mg/dL — AB (ref 70–99)
Potassium: 4 mEq/L (ref 3.7–5.3)
Sodium: 140 mEq/L (ref 137–147)

## 2014-01-20 LAB — T4, FREE: Free T4: 1.3 ng/dL (ref 0.80–1.80)

## 2014-01-20 LAB — LIPID PANEL
CHOL/HDL RATIO: 2.7 ratio
CHOLESTEROL: 114 mg/dL (ref 0–200)
HDL: 43 mg/dL (ref >39)
LDL Cholesterol: 49 mg/dL (ref 0–99)
Triglycerides: 110 mg/dL (ref <150)
VLDL: 22 mg/dL (ref 0–40)

## 2014-01-20 LAB — CBC
HEMATOCRIT: 41.1 % (ref 39.0–52.0)
Hemoglobin: 13.7 g/dL (ref 13.0–17.0)
MCH: 31.9 pg (ref 26.0–34.0)
MCHC: 33.3 g/dL (ref 30.0–36.0)
MCV: 95.8 fL (ref 78.0–100.0)
Platelets: 228 10*3/uL (ref 150–400)
RBC: 4.29 MIL/uL (ref 4.22–5.81)
RDW: 13.6 % (ref 11.5–15.5)
WBC: 8.9 10*3/uL (ref 4.0–10.5)

## 2014-01-20 LAB — HEPARIN LEVEL (UNFRACTIONATED): Heparin Unfractionated: 0.15 IU/mL — ABNORMAL LOW (ref 0.30–0.70)

## 2014-01-20 MED ORDER — FUROSEMIDE 40 MG PO TABS
40.0000 mg | ORAL_TABLET | Freq: Every day | ORAL | Status: DC
  Administered 2014-01-20 – 2014-01-23 (×4): 40 mg via ORAL
  Filled 2014-01-20 (×4): qty 1

## 2014-01-20 MED ORDER — SODIUM CHLORIDE 0.9 % IV SOLN
INTRAVENOUS | Status: DC | PRN
  Administered 2014-01-23: 13:00:00 via INTRAVENOUS

## 2014-01-20 MED ORDER — AMIODARONE HCL 200 MG PO TABS
400.0000 mg | ORAL_TABLET | Freq: Two times a day (BID) | ORAL | Status: DC
  Administered 2014-01-20 – 2014-01-23 (×7): 400 mg via ORAL
  Filled 2014-01-20 (×9): qty 2

## 2014-01-20 NOTE — Progress Notes (Signed)
Advanced Heart Failure Rounding Note   Subjective:     Francisco Merritt is a 80 y.o. male with a history of obesity, hypertension, CAD, paroxysmal atrial fibrillation, complicated by tachybradycardia syndrome and is status post pacemaker implantation and takes coumadin.   Admitted was seen in clinic last week with worsening EF now 30-35%. Underwent R/L cath 10/7.   RHC/LHC 02/05/2014  RA = 10  RV = 38/5/10  PA = 39/22 (29)  PCW = 29  Fick cardiac output/index = 4.0/1.9  PVR = < 1.0 WU  FA sat = 96%  PA sat = 60% 58% Left main: mild plaque  LAD: came off in sort of a ramus orientation. It was heavily calcified throughout the ostial and proximal portion. There was moderate plaque ostially followed by a focal 80-90% lesion. There was mild to moderate disease in the mid to distal LAD but it was suitable for grafting. There was a first diagonal, a long second diagonal, and a small third diagonal. There was also a prominent septal perforator. The second diagonal was chronically totally occluded ostially with a very faint distal filling.  LCX: was a large vessel. It gave off a large branching OM-1. There was a 30% tubular lesion in the midsection. In the ostial portion of the lower branch of the OM-1 there was a 99% stenosis. There was a 30-40% stenosis more distally  RCA: was a large dominant vessel. It gave off an acute marginal branch, a small PDA, and 3 posterolaterals. There was a 50% lesion ostially. There were diffuse luminal irregularities throughout the remainder of the vessel.  Significant 2v CAD with EF 30-35% by echo   Objective:   Weight Range:  Vital Signs:   Temp:  [97.7 F (36.5 C)-98.3 F (36.8 C)] 97.7 F (36.5 C) (10/08 0440) Pulse Rate:  [57-115] 115 (10/08 0731) Resp:  [15-25] 18 (10/08 0440) BP: (109-168)/(60-126) 157/79 mmHg (10/08 0731) SpO2:  [93 %-99 %] 96 % (10/08 0440) Weight:  [213 lb 10 oz (96.9 kg)] 213 lb 10 oz (96.9 kg) (10/08 0440) Last BM Date:  02/09/2014  Weight change: Filed Weights   01/31/2014 1210 01/20/14 0440  Weight: 202 lb (91.627 kg) 213 lb 10 oz (96.9 kg)    Intake/Output:   Intake/Output Summary (Last 24 hours) at 01/20/14 1251 Last data filed at 01/20/14 0600  Gross per 24 hour  Intake    200 ml  Output   1275 ml  Net  -1075 ml     Physical Exam: General:  ElderlyNo resp difficulty HEENT: normal Neck: supple. JVP 7 . Carotids 2+ bilat; no bruits. No lymphadenopathy or thryomegaly appreciated. Cor: PMI nondisplaced. Irregular rate & rhythm. No rubs, gallops or murmurs. Lungs: clear Abdomen: soft, nontender, nondistended. No hepatosplenomegaly. No bruits or masses. Good bowel sounds. Extremities: no cyanosis, clubbing, rash, edema R groin ok no bruit Neuro: alert & orientedx3, cranial nerves grossly intact. moves all 4 extremities w/o difficulty. Affect pleasant  Telemetry:  AF  Labs: Basic Metabolic Panel:  Recent Labs Lab 01/28/2014 1947 01/20/14 0358  NA 137 140  K 3.7 4.0  CL 102 103  CO2 21 20  GLUCOSE 139* 100*  BUN 18 19  CREATININE 1.41* 1.43*  CALCIUM 9.0 8.9    Liver Function Tests: No results found for this basename: AST, ALT, ALKPHOS, BILITOT, PROT, ALBUMIN,  in the last 168 hours No results found for this basename: LIPASE, AMYLASE,  in the last 168 hours No results found for this basename:  AMMONIA,  in the last 168 hours  CBC:  Recent Labs Lab 01/18/2014 1947 01/20/14 0358  WBC 10.9* 8.9  HGB 14.2 13.7  HCT 41.6 41.1  MCV 96.1 95.8  PLT 211 228    Cardiac Enzymes: No results found for this basename: CKTOTAL, CKMB, CKMBINDEX, TROPONINI,  in the last 168 hours  BNP: BNP (last 3 results)  Recent Labs  01/06/14 1305  PROBNP 2171.0*     Other results:    Imaging:  No results found.   Medications:     Scheduled Medications: . aspirin EC  81 mg Oral Daily  . atorvastatin  40 mg Oral q1800  . carvedilol  6.25 mg Oral BID WC  . furosemide  40 mg Intravenous  Once  . multivitamin with minerals  1 tablet Oral Daily  . pantoprazole  40 mg Oral Daily  . tamsulosin  0.4 mg Oral QPC supper     Infusions: . heparin 1,200 Units/hr (01/20/14 0554)     PRN Medications:  sodium chloride, acetaminophen, nitroGLYCERIN, ondansetron (ZOFRAN) IV, traMADol   Assessment:   1. Coronary artery disease 2. A/c systolic HF 3, iCM EF 54-65% 4. [name]  Plan/Discussion:     Length of Stay: 1 [name],[name] 01/20/2014, 12:51 PM Advanced Heart Failure Team Pager [phone] (M-F; 7a - 4p)  Please contact [name] for night-coverage after hours (4p -7a ) and weekends on [email or web address]  Patient seen and examined with Darrick Grinder, NP. We discussed all aspects of the encounter. I agree with the assessment and plan as stated above.   Results of cath reviewed. He has been seen by Dr. Roxan Hockey. Plan is for CABG and Maze on Monday. Will continue IV heparin and start po amiodarone. Will stay in house until CABG.   Volume status looks good. Can resume po diuretics.   Carle Dargan,MD 3:23 PM

## 2014-01-20 NOTE — Consult Note (Signed)
Reason for Consult: 3 vessel CAD Referring Physician: Dr. Fredderick Merritt is an 80 y.o. male.  HPI: 80 yo man with known CAD and paroxysmal atrial fibrillation presented after a near syncopal spell.  Francisco Merritt is an 81 year old with a history of obesity, hypertension, hyperlipidemia, single vessel coronary artery disease, and paroxysmal atrial fibrillation. His atrial fibrillation is complicated by tachybradycardia syndrome and he has a pacemaker in place. He is on coumadin and sotalol. In the past he has had difficulty with beta blocker and diltiazem due to low heart rates.   He was recently admitted to [facility] on 01/06/14 with near syncope thought to be from a vasovagal episode. He was at a store when he became light headed and dizzy and nearly fell. Two men who were nearby grabbed him and held him up until a chair was brought to him. He also had nausea adn threw up. He was brought to the ED. He was noted to be hypoxic. His pacer was interroagated and showed his HR < 100 90% of the time. He had an ECHO that showed reduced EF to 30-35%. He ruled out for an MI. Amlodipine stopped due to orthostatic hypotension. He was discharged and then returned yesterday for cardiac catheterization.  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 He does get short of breath with exertion but does not have any chest pain. He has been feeling tired recently.    Past Medical History  Diagnosis Date  . Paroxysmal atrial fibrillation     s/p Guidant Insignia pacemaker; started sotalol 06/2009  . Tachy-brady syndrome   . Coronary artery disease     one vessel CAD with normal EF; Cath 12/2006. Ef normal. LM nl, LAD 40%, D2 occulded with collaterals, LCX 40%, RCA 30%; Echo 02/2009 EF 16-10% grade I diastolic dysfunction.  Marland Kitchen History of gallstones     status post cholecystectomy  . Hypertension   . Hyperlipidemia   . History of peptic  ulcer disease   . Benign prostatic hypertrophy   . Obesity   . Diverticulosis of colon   . Testosterone deficiency   . ED (erectile dysfunction)   . Atrial fibrillation   . Nephrolithiasis     "passed it"  . Osteoarthritis     "legs and lower back" (01/28/2014)  . Chronic lower back pain     Past Surgical History  Procedure Laterality Date  . Insert / replace / remove pacemaker  2008  . Colonoscopy  1999  . Upper gastroduodenoscopy  1999  . Doppler evaluation    . Cardiac catheterization  ~ 2008; 01/18/2014    ; "unable to put stents in"  . Cholecystectomy  ~ 2008  . Cataract extraction, bilateral Bilateral     Family History  Problem Relation Age of Onset  . Hypertension Mother   . Heart disease Mother   . Diabetes Mother   . Heart attack Father   . Diabetes Brother   . Hypertension Brother   . Heart disease Brother   . Diabetes Brother   . Hypertension Brother   . Heart disease Brother     Social History:  reports that he has never smoked. He has never used smokeless tobacco. He reports that he does not drink alcohol or use illicit drugs.  Allergies:  Allergies  Allergen Reactions  . Sulfonamide Derivatives     REACTION: n/v  Medications:  Scheduled: . amiodarone  400 mg Oral BID  . aspirin EC  81 mg Oral Daily  . atorvastatin  40 mg Oral q1800  . carvedilol  6.25 mg Oral BID WC  . furosemide  40 mg Intravenous Once  . multivitamin with minerals  1 tablet Oral Daily  . pantoprazole  40 mg Oral Daily  . tamsulosin  0.4 mg Oral QPC supper    Results for orders placed during the hospital encounter of 01/20/2014 (from the past 48 hour(s))  PROTIME-INR     Status: Abnormal   Collection Time    01/13/2014 12:10 PM      Result Value Ref Range   Prothrombin Time 15.3 (*) 11.6 - 15.2 seconds   INR 1.21  0.00 - 1.49  POCT I-STAT 3, ART BLOOD GAS (G3+)     Status: Abnormal   Collection Time    01/18/2014  3:03 PM      Result Value Ref Range   pH, Arterial  7.364  7.350 - 7.450   pCO2 arterial 38.7  35.0 - 45.0 mmHg   pO2, Arterial 87.0  80.0 - 100.0 mmHg   Bicarbonate 22.1  20.0 - 24.0 mEq/L   TCO2 23  0 - 100 mmol/L   O2 Saturation 96.0     Acid-base deficit 3.0 (*) 0.0 - 2.0 mmol/L   Sample type ARTERIAL    POCT I-STAT 3, ART BLOOD GAS (G3+)     Status: Abnormal   Collection Time    02/02/2014  3:58 PM      Result Value Ref Range   pH, Arterial 7.402  7.350 - 7.450   pCO2 arterial 41.9  35.0 - 45.0 mmHg   pO2, Arterial 31.0 (*) 80.0 - 100.0 mmHg   Bicarbonate 26.1 (*) 20.0 - 24.0 mEq/L   TCO2 27  0 - 100 mmol/L   O2 Saturation 60.0     Acid-Base Excess 1.0  0.0 - 2.0 mmol/L   Sample type ARTERIAL     Comment NOTIFIED PHYSICIAN    POCT I-STAT 3, VENOUS BLOOD GAS (G3P V)     Status: Abnormal   Collection Time    01/20/2014  4:04 PM      Result Value Ref Range   pH, Ven 7.385 (*) 7.250 - 7.300   pCO2, Ven 40.1 (*) 45.0 - 50.0 mmHg   pO2, Ven 30.0  30.0 - 45.0 mmHg   Bicarbonate 24.0  20.0 - 24.0 mEq/L   TCO2 25  0 - 100 mmol/L   O2 Saturation 57.0     Acid-base deficit 1.0  0.0 - 2.0 mmol/L   Sample type VENOUS     Comment NOTIFIED PHYSICIAN    POCT I-STAT 3, ART BLOOD GAS (G3+)     Status: Abnormal   Collection Time    02/03/2014  4:07 PM      Result Value Ref Range   pH, Arterial 7.395  7.350 - 7.450   pCO2 arterial 35.5  35.0 - 45.0 mmHg   pO2, Arterial 76.0 (*) 80.0 - 100.0 mmHg   Bicarbonate 21.7  20.0 - 24.0 mEq/L   TCO2 23  0 - 100 mmol/L   O2 Saturation 95.0     Acid-base deficit 3.0 (*) 0.0 - 2.0 mmol/L   Sample type ARTERIAL    CBC     Status: Abnormal   Collection Time    02/01/2014  7:47 PM      Result Value Ref Range  WBC 10.9 (*) 4.0 - 10.5 K/uL   RBC 4.33  4.22 - 5.81 MIL/uL   Hemoglobin 14.2  13.0 - 17.0 g/dL   HCT 41.6  39.0 - 52.0 %   MCV 96.1  78.0 - 100.0 fL   MCH 32.8  26.0 - 34.0 pg   MCHC 34.1  30.0 - 36.0 g/dL   RDW 13.4  11.5 - 15.5 %   Platelets 211  150 - 400 K/uL  BASIC METABOLIC PANEL      Status: Abnormal   Collection Time    01/17/2014  7:47 PM      Result Value Ref Range   Sodium 137  137 - 147 mEq/L   Potassium 3.7  3.7 - 5.3 mEq/L   Chloride 102  96 - 112 mEq/L   CO2 21  19 - 32 mEq/L   Glucose, Bld 139 (*) 70 - 99 mg/dL   BUN 18  6 - 23 mg/dL   Creatinine, Ser 1.41 (*) 0.50 - 1.35 mg/dL   Calcium 9.0  8.4 - 10.5 mg/dL   GFR calc non Af Amer 45 (*) >90 mL/min   GFR calc Af Amer 52 (*) >90 mL/min   Comment: (NOTE)     The eGFR has been calculated using the CKD EPI equation.     This calculation has not been validated in all clinical situations.     eGFR's persistently <90 mL/min signify possible Chronic Kidney     Disease.   Anion gap 14  5 - 15  TSH     Status: None   Collection Time    01/18/2014  7:47 PM      Result Value Ref Range   TSH 1.280  0.350 - 4.500 uIU/mL  T4, FREE     Status: None   Collection Time    02/03/2014  7:47 PM      Result Value Ref Range   Free T4 1.30  0.80 - 1.80 ng/dL   Comment: Performed at [facility]     Status: Abnormal   Collection Time    01/20/14  3:58 AM      Result Value Ref Range   Sodium 140  137 - 147 mEq/L   Potassium 4.0  3.7 - 5.3 mEq/L   Chloride 103  96 - 112 mEq/L   CO2 20  19 - 32 mEq/L   Glucose, Bld 100 (*) 70 - 99 mg/dL   BUN 19  6 - 23 mg/dL   Creatinine, Ser 1.43 (*) 0.50 - 1.35 mg/dL   Calcium 8.9  8.4 - 10.5 mg/dL   GFR calc non Af Amer 44 (*) >90 mL/min   GFR calc Af Amer 51 (*) >90 mL/min   Comment: (NOTE)     The eGFR has been calculated using the CKD EPI equation.     This calculation has not been validated in all clinical situations.     eGFR's persistently <90 mL/min signify possible Chronic Kidney     Disease.   Anion gap 17 (*) 5 - 15  PROTIME-INR     Status: Abnormal   Collection Time    01/20/14  3:58 AM      Result Value Ref Range   Prothrombin Time 16.2 (*) 11.6 - 15.2 seconds   INR 1.30  0.00 - 1.49  CBC     Status: None   Collection Time     01/20/14  3:58 AM  Result Value Ref Range   WBC 8.9  4.0 - 10.5 K/uL   RBC 4.29  4.22 - 5.81 MIL/uL   Hemoglobin 13.7  13.0 - 17.0 g/dL   HCT 41.1  39.0 - 52.0 %   MCV 95.8  78.0 - 100.0 fL   MCH 31.9  26.0 - 34.0 pg   MCHC 33.3  30.0 - 36.0 g/dL   RDW 13.6  11.5 - 15.5 %   Platelets 228  150 - 400 K/uL  LIPID PANEL     Status: None   Collection Time    01/20/14  3:58 AM      Result Value Ref Range   Cholesterol 114  0 - 200 mg/dL   Triglycerides 110  <150 mg/dL   HDL 43  >39 mg/dL   Total CHOL/HDL Ratio 2.7     VLDL 22  0 - 40 mg/dL   LDL Cholesterol 49  0 - 99 mg/dL   Comment:            Total Cholesterol/HDL:CHD Risk     Coronary Heart Disease Risk Table                         Men   Women      1/2 Average Risk   3.4   3.3      Average Risk       5.0   4.4      2 X Average Risk   9.6   7.1      3 X Average Risk  23.4   11.0                Use the calculated Patient Ratio     above and the CHD Risk Table     to determine the patient's CHD Risk.                ATP III CLASSIFICATION (LDL):      <100     mg/dL   Optimal      100-129  mg/dL   Near or Above                        Optimal      130-159  mg/dL   Borderline      160-189  mg/dL   High      >190     mg/dL   Very High    No results found.  Review of Systems  Constitutional: Positive for malaise/fatigue. Negative for fever and chills.  Respiratory: Positive for cough and shortness of breath.   Neurological: Positive for dizziness and loss of consciousness.  Endo/Heme/Allergies: Bruises/bleeds easily (on coumadin).   Blood pressure 132/94, pulse 86, temperature 97.7 F (36.5 Merritt), temperature source Oral, resp. rate 18, height _0  (1.803 m), weight 213 lb 10 oz (96.9 kg), SpO2 96.00%. Physical Exam  Vitals reviewed. Constitutional: He is oriented to person, place, and time. He appears well-developed and well-nourished. No distress.  HENT:  Head: Normocephalic.  Eyes: EOM are normal. Pupils are  equal, round, and reactive to light.  Neck: Neck supple. No thyromegaly present.  + faint left bruit  Cardiovascular: Intact distal pulses.   No murmur heard. Irregularly irregular, tachycardic  Respiratory: Effort normal and breath sounds normal. He has no wheezes. He has no rales.  GI: Soft. There is no tenderness.  Musculoskeletal: He exhibits no edema.  Lymphadenopathy:  He has no cervical adenopathy.  Neurological: He is alert and oriented to person, place, and time. No cranial nerve deficit.  No focal motor deficit  Skin: Skin is warm and dry.   CARDIAC CATHETERIZATION Findings:  RA = 10  RV = 38/5/10  PA = 39/22 (29)  PCW = 29  Fick cardiac output/index = 4.0/1.9  PVR = < 1.0 WU  FA sat = 96%  PA sat = 60% 58%  Ao Pressure: 142/84 (107)  LV Pressure: AoV not crossed  Left main: mild plaque  LAD: came off in sort of a ramus orientation. It was heavily calcified throughout the ostial and proximal portion. There was moderate plaque ostially followed by a focal 80-90% lesion. There was mild to moderate disease in the mid to distal LAD but it was suitable for grafting. There was a first diagonal, a long second diagonal, and a small third diagonal. There was also a prominent septal perforator. The second diagonal was chronically totally occluded ostially with a very faint distal filling.  LCX: was a large vessel. It gave off a large branching OM-1. There was a 30% tubular lesion in the midsection. In the ostial portion of the lower branch of the OM-1 there was a 99% stenosis. There was a 30-40% stenosis more distally  RCA: was a large dominant vessel. It gave off an acute marginal branch, a small PDA, and 3 posterolaterals. There was a 50% lesion ostially. There were diffuse luminal irregularities throughout the remainder of the vessel.  L subclavian: patent  Assessment:  1. Significant 2v CAD with EF 30-35% by echo  2. Elevated gfilling pressure with moderately reduced CO  3.  Very tortuous R iliac system  4. Patent L subclavian  Plan/Discussion:  We will admit him for observation and to have TCTS consult for possible CABG. If not CABG candidate consider 2v PCI of LAD and OM1 on Friday. Would diurese overnight. Titrate HF meds as tolerated.  Daniel Bensimhon,MD  5:29 PM   Assessment/Plan: 80 yo man with severe 2 vessel CAD and moderate RCA disease (50% ostial lesion) and paroxysmal atrial fibrillation/ tachy-brady syndrome presents after a near syncopal spell.  He has a 90% proximal LAD lesion, a 99% stenosis in a large OM and a 50% ostial RCA stenosis. CABG is indicated for survival benefit and relief of symptoms. He also is a candidate for a Maze procedure given his history of a fib/ tachy-brady.  I recommended to him that we proceed with CABG, maze procedure  I discussed the general nature of the procedure, the need for general anesthesia, the use of cardiopulmonary bypass, and the incisions to be used with Francisco and Francisco Merritt. I informed them of the expected hospital stay, overall recovery and short and long term outcomes. We discussed the indications, risks, benefits and alternatives. They understand the risks include, but are not limited to death, stroke, MI, DVT/PE, bleeding, possible need for transfusion, infections, cardiac arrhythmias, heart block, and other organ system dysfunction including respiratory, renal, or GI complications. He accepts the risks and agrees to proceed.  Plan CABG, Maze on Monday 10/12  Francisco Merritt,Francisco Merritt 01/20/2014, 2:58 PM

## 2014-01-20 NOTE — Progress Notes (Signed)
ANTICOAGULATION CONSULT NOTE - Follow Up Consult  Pharmacy Consult for heparin Indication: atrial fibrillation  Allergies  Allergen Reactions  . Sulfonamide Derivatives     REACTION: n/v    Patient Measurements: Height: 5\' 11"  (180.3 cm) Weight: 213 lb 10 oz (96.9 kg) IBW/kg (Calculated) : 75.3 Heparin Dosing Weight:   Vital Signs: Temp: 97.7 F (36.5 C) (10/08 0440) Temp Source: Oral (10/08 0440) BP: 157/79 mmHg (10/08 0731) Pulse Rate: 115 (10/08 0731)  Labs:  Recent Labs  01/23/2014 1210 02/09/2014 1947 01/20/14 0358  HGB  --  14.2 13.7  HCT  --  41.6 41.1  PLT  --  211 228  LABPROT 15.3*  --  16.2*  INR 1.21  --  1.30  CREATININE  --  1.41* 1.43*    Estimated Creatinine Clearance: 48.1 ml/min (by C-G formula based on Cr of 1.43).   Medications:  Scheduled:  . aspirin EC  81 mg Oral Daily  . atorvastatin  40 mg Oral q1800  . carvedilol  6.25 mg Oral BID WC  . furosemide  40 mg Intravenous Once  . multivitamin with minerals  1 tablet Oral Daily  . pantoprazole  40 mg Oral Daily  . tamsulosin  0.4 mg Oral QPC supper   Infusions:  . heparin 1,200 Units/hr (01/20/14 0554)    Assessment: 80 yo male with afib is currently on subtherapeutic heparin.  Heparin level is 0.15. Hgb 13.7 and Plt 228 K Goal of Therapy:  Heparin level 0.3-0.7 units/ml Monitor platelets by anticoagulation protocol: Yes   Plan:  - increase heparin to 1450 units/hr - 8hr heparin level Latanja Lehenbauer, Tsz-Yin 01/20/2014,8:41 AM

## 2014-01-21 ENCOUNTER — Telehealth: Payer: Self-pay | Admitting: Internal Medicine

## 2014-01-21 ENCOUNTER — Inpatient Hospital Stay (HOSPITAL_COMMUNITY): Payer: Medicare HMO

## 2014-01-21 DIAGNOSIS — I4891 Unspecified atrial fibrillation: Secondary | ICD-10-CM

## 2014-01-21 DIAGNOSIS — I5032 Chronic diastolic (congestive) heart failure: Secondary | ICD-10-CM

## 2014-01-21 DIAGNOSIS — I25110 Atherosclerotic heart disease of native coronary artery with unstable angina pectoris: Principal | ICD-10-CM

## 2014-01-21 LAB — PULMONARY FUNCTION TEST
FEF 25-75 POST: 2.05 L/s
FEF 25-75 PRE: 2.07 L/s
FEF2575-%CHANGE-POST: 0 %
FEF2575-%PRED-POST: 104 %
FEF2575-%Pred-Pre: 104 %
FEV1-%Change-Post: 0 %
FEV1-%PRED-POST: 64 %
FEV1-%Pred-Pre: 64 %
FEV1-POST: 1.88 L
FEV1-PRE: 1.89 L
FEV1FVC-%CHANGE-POST: 2 %
FEV1FVC-%PRED-PRE: 119 %
FEV6-%CHANGE-POST: -3 %
FEV6-%Pred-Post: 56 %
FEV6-%Pred-Pre: 58 %
FEV6-Post: 2.17 L
FEV6-Pre: 2.24 L
FEV6FVC-%PRED-POST: 107 %
FEV6FVC-%Pred-Pre: 107 %
FVC-%CHANGE-POST: -3 %
FVC-%PRED-POST: 52 %
FVC-%Pred-Pre: 54 %
FVC-Post: 2.17 L
FVC-Pre: 2.24 L
PRE FEV6/FVC RATIO: 100 %
Post FEV1/FVC ratio: 87 %
Post FEV6/FVC ratio: 100 %
Pre FEV1/FVC ratio: 85 %

## 2014-01-21 LAB — BASIC METABOLIC PANEL
Anion gap: 16 — ABNORMAL HIGH (ref 5–15)
BUN: 24 mg/dL — AB (ref 6–23)
CALCIUM: 9.3 mg/dL (ref 8.4–10.5)
CO2: 20 mEq/L (ref 19–32)
Chloride: 101 mEq/L (ref 96–112)
Creatinine, Ser: 1.61 mg/dL — ABNORMAL HIGH (ref 0.50–1.35)
GFR calc non Af Amer: 38 mL/min — ABNORMAL LOW (ref >90)
GFR, EST AFRICAN AMERICAN: 45 mL/min — AB (ref >90)
GLUCOSE: 109 mg/dL — AB (ref 70–99)
POTASSIUM: 4.1 meq/L (ref 3.7–5.3)
SODIUM: 137 meq/L (ref 137–147)

## 2014-01-21 LAB — CBC
HEMATOCRIT: 41.3 % (ref 39.0–52.0)
HEMOGLOBIN: 14 g/dL (ref 13.0–17.0)
MCH: 32.4 pg (ref 26.0–34.0)
MCHC: 33.9 g/dL (ref 30.0–36.0)
MCV: 95.6 fL (ref 78.0–100.0)
Platelets: 204 10*3/uL (ref 150–400)
RBC: 4.32 MIL/uL (ref 4.22–5.81)
RDW: 13.4 % (ref 11.5–15.5)
WBC: 8.5 10*3/uL (ref 4.0–10.5)

## 2014-01-21 LAB — HEPARIN LEVEL (UNFRACTIONATED)
Heparin Unfractionated: 1.13 IU/mL — ABNORMAL HIGH (ref 0.30–0.70)
Heparin Unfractionated: 0.98 IU/mL — ABNORMAL HIGH (ref 0.30–0.70)
Heparin Unfractionated: 0.66 IU/mL (ref 0.30–0.70)

## 2014-01-21 MED ORDER — HEPARIN (PORCINE) IN NACL 100-0.45 UNIT/ML-% IJ SOLN
1200.0000 [IU]/h | INTRAMUSCULAR | Status: DC
  Administered 2014-01-21: 1200 [IU]/h via INTRAVENOUS
  Filled 2014-01-21: qty 250

## 2014-01-21 MED ORDER — ALBUTEROL SULFATE (2.5 MG/3ML) 0.083% IN NEBU
2.5000 mg | INHALATION_SOLUTION | Freq: Once | RESPIRATORY_TRACT | Status: AC
  Administered 2014-01-21: 2.5 mg via RESPIRATORY_TRACT

## 2014-01-21 MED ORDER — ISOSORBIDE MONONITRATE ER 30 MG PO TB24
30.0000 mg | ORAL_TABLET | Freq: Every day | ORAL | Status: DC
  Administered 2014-01-21 – 2014-01-23 (×3): 30 mg via ORAL
  Filled 2014-01-21 (×4): qty 1

## 2014-01-21 MED ORDER — HEPARIN (PORCINE) IN NACL 100-0.45 UNIT/ML-% IJ SOLN
750.0000 [IU]/h | INTRAMUSCULAR | Status: DC
  Administered 2014-01-21: 1000 [IU]/h via INTRAVENOUS
  Administered 2014-01-22: 750 [IU]/h via INTRAVENOUS
  Filled 2014-01-21 (×2): qty 250

## 2014-01-21 NOTE — Progress Notes (Addendum)
Advanced Heart Failure Rounding Note   Subjective:     Francisco Merritt is a 80 y.o. male with a history of obesity, hypertension, CAD, paroxysmal atrial fibrillation, complicated by tachybradycardia syndrome and is status post pacemaker implantation and takes coumadin.   Seen in HF clinic last week with worsening EF now 30-35%. Underwent R/L cath 10/7.  Severe 2V CAD. Pending CABG on Monday.  Occasional CP but feels ok now. No dyspnea.   Objective:   Weight Range:  Vital Signs:   Temp:  [97.6 F (36.4 C)-97.9 F (36.6 C)] 97.6 F (36.4 C) (10/09 1500) Pulse Rate:  [49-88] 49 (10/09 1500) Resp:  [18-19] 18 (10/09 1500) BP: (95-125)/(49-86) 95/49 mmHg (10/09 1500) SpO2:  [96 %-100 %] 100 % (10/09 1500) Weight:  [93.7 kg (206 lb 9.1 oz)] 93.7 kg (206 lb 9.1 oz) (10/09 0500) Last BM Date: 01/20/14  Weight change: Filed Weights   01/23/2014 1210 01/20/14 0440 01/21/14 0500  Weight: 91.627 kg (202 lb) 96.9 kg (213 lb 10 oz) 93.7 kg (206 lb 9.1 oz)    Intake/Output:   Intake/Output Summary (Last 24 hours) at 01/21/14 1627 Last data filed at 01/21/14 0840  Gross per 24 hour  Intake  629.5 ml  Output   2701 ml  Net -2071.5 ml     Physical Exam: General:  Elderly. Sitting in chair No resp difficulty HEENT: normal Neck: supple. JVP 7 . Carotids 2+ bilat; no bruits. No lymphadenopathy or thryomegaly appreciated. Cor: PMI nondisplaced. Irregular rate & rhythm. No rubs, gallops or murmurs. Lungs: clear Abdomen: soft, nontender, nondistended. No hepatosplenomegaly. No bruits or masses. Good bowel sounds. Extremities: no cyanosis, clubbing, rash, edema  condom cath on  Neuro: alert & orientedx3, cranial nerves grossly intact. moves all 4 extremities w/o difficulty. Affect pleasant  Telemetry:  AF  Labs: Basic Metabolic Panel:  Recent Labs Lab 01/18/2014 1947 01/20/14 0358  NA 137 140  K 3.7 4.0  CL 102 103  CO2 21 20  GLUCOSE 139* 100*  BUN 18 19  CREATININE 1.41* 1.43*   CALCIUM 9.0 8.9    Liver Function Tests: No results found for this basename: AST, ALT, ALKPHOS, BILITOT, PROT, ALBUMIN,  in the last 168 hours No results found for this basename: LIPASE, AMYLASE,  in the last 168 hours No results found for this basename: AMMONIA,  in the last 168 hours  CBC:  Recent Labs Lab 01/28/2014 1947 01/20/14 0358 01/21/14 0337  WBC 10.9* 8.9 8.5  HGB 14.2 13.7 14.0  HCT 41.6 41.1 41.3  MCV 96.1 95.8 95.6  PLT 211 228 204    Cardiac Enzymes: No results found for this basename: CKTOTAL, CKMB, CKMBINDEX, TROPONINI,  in the last 168 hours  BNP: BNP (last 3 results)  Recent Labs  01/06/14 1305  PROBNP 2171.0*     Other results:    Imaging: No results found.   Medications:     Scheduled Medications: . amiodarone  400 mg Oral BID  . aspirin EC  81 mg Oral Daily  . atorvastatin  40 mg Oral q1800  . carvedilol  6.25 mg Oral BID WC  . furosemide  40 mg Intravenous Once  . furosemide  40 mg Oral Daily  . multivitamin with minerals  1 tablet Oral Daily  . pantoprazole  40 mg Oral Daily  . tamsulosin  0.4 mg Oral QPC supper    Infusions: . heparin 1,000 Units/hr (01/21/14 1316)    PRN Medications: sodium chloride, acetaminophen, nitroGLYCERIN, ondansetron (  ZOFRAN) IV, traMADol   Assessment:   1. Coronary artery disease 2. A/c systolic HF 3, iCM EF 03-47% 4. [name] 5. CKD, stage III  Plan/Discussion:     Doing well. Plan for CABG and Maze on Monday. Will continue IV heparin and po amiodarone. Will stay in house until CABG. Start Imdur. If CP continues can start IV NTG. Start ACE post-op.  Volume status looks good. Continue po diuretics.   Benay Spice 4:27 PM

## 2014-01-21 NOTE — Progress Notes (Signed)
ANTICOAGULATION CONSULT NOTE - Follow Up Consult  Pharmacy Consult for heparin Indication: atrial fibrillation  Labs:  Recent Labs  01/28/2014 1210 02/09/2014 1947 01/20/14 0358 01/20/14 1440 01/21/14 0030  HGB  --  14.2 13.7  --   --   HCT  --  41.6 41.1  --   --   PLT  --  211 228  --   --   LABPROT 15.3*  --  16.2*  --   --   INR 1.21  --  1.30  --   --   HEPARINUNFRC  --   --   --  0.15* 1.13*  CREATININE  --  1.41* 1.43*  --   --     Assessment: 80yo male now supratherapeutic on heparin after rate increase, drawn correctly per RN.  Goal of Therapy:  Heparin level 0.3-0.7 units/ml   Plan:  Will hold heparin gtt x64min then resume at lower rate of 1200 units/hr and check level in [place], PharmD, BCPS  01/21/2014,2:30 AM

## 2014-01-21 NOTE — Progress Notes (Signed)
At rest patient h.r. At. Fib. 90-low 100's. With any activity h.r. Goes up to 140-150's at. Fib. Patient getting a little s.o.b. Having to get up and vd. A lot. Condom cath applied and 02 at 2 l/m n.c for comfort..  and r.n. Aware.

## 2014-01-21 NOTE — Progress Notes (Signed)
CARDIAC REHAB PHASE I   PRE:  Rate/Rhythm: 99 Paced Afib  BP:  Supine: 94/52  Sitting:   Standing:    SaO2: 95 RA  MODE:  Ambulation: 220 ft   POST:  Rate/Rhythm: 136 Afib  BP:  Supine:   Sitting: 64/56  Standing:    SaO2: 96 RA [id number] Assisted X 2 and used walker to ambulate. Gait steady with walker, he leans forward walking and needs reminders for upright posture. Pt able to walk 220 feet without c/o. Pt to recliner after walk with call light in reach. HR 136 after walk Afib. Instructed pt to call for assistance if he needs to get up. Will follow next walk as assist X1.  Rodney Langton RN 01/21/2014 9:44 AM

## 2014-01-21 NOTE — Progress Notes (Signed)
ANTICOAGULATION CONSULT NOTE - Follow Up Consult  Pharmacy Consult for Heparin Indication: atrial fibrillation  Allergies  Allergen Reactions  . Sulfonamide Derivatives     REACTION: n/v    Patient Measurements: Height: 5\' 11"  (180.3 cm) Weight: 206 lb 9.1 oz (93.7 kg) IBW/kg (Calculated) : 75.3 Heparin Dosing Weight: 91 kg  Vital Signs: Temp: 97.7 F (36.5 C) (10/09 0500) Temp Source: Oral (10/09 0500) BP: 125/77 mmHg (10/09 0500) Pulse Rate: 68 (10/09 0500)  Labs:  Recent Labs  02/09/2014 1210  01/20/2014 1947 01/20/14 0358 01/20/14 1440 01/21/14 0030 01/21/14 0337 01/21/14 1130  HGB  --   < > 14.2 13.7  --   --  14.0  --   HCT  --   --  41.6 41.1  --   --  41.3  --   PLT  --   --  211 228  --   --  204  --   LABPROT 15.3*  --   --  16.2*  --   --   --   --   INR 1.21  --   --  1.30  --   --   --   --   HEPARINUNFRC  --   --   --   --  0.15* 1.13*  --  0.98*  CREATININE  --   --  1.41* 1.43*  --   --   --   --   < > = values in this interval not displayed.  Estimated Creatinine Clearance: 47.4 ml/min (by C-G formula based on Cr of 1.43).  Assessment: 80 year old man on Warfarin PTA for AFIB is s/p cardiac cath 10/7 to be evaluated for possible CABG.  AC: War>>Heparin. Heparin level 0.98 remains elevated. CBC stable.  - PTA Coumadin dose 5mg  Sun, 2.5mg  other days.  Card: hx of CAD, HLD, HTN, HF, Afib. VSS. Meds: Amio, ASA81, Lipitor, Coreg, Lasix,  Endo/GI: on protonix  Hem/Onc: CBC stable  Renal: SCr 1.43 (CrCl ~48) s/p contrast; other lytes ok  Home meds: coumadin and lisinopril  Best practice: heparin  Goal of Therapy:  Heparin level 0.3-0.7 units/ml Monitor platelets by anticoagulation protocol: Yes   Plan:  Planning for CABG and Maze on Monday Decrease IV heparin to 1000 units/hr Recheck heparin level in 6 hrs.    Kaymarie Wynn S. Alford Highland, PharmD, BCPS Clinical Staff Pharmacist Pager [phone]  Eilene Ghazi Stillinger 01/21/2014,1:09  PM

## 2014-01-21 NOTE — Progress Notes (Signed)
[facility]  Patient Status: Active (receiving services up to time of hospitalization)  [facility] is providing the following services: RN and PT  If patient discharges after hours, please call [phone].   [facility] 01/21/2014, 11:36 AM

## 2014-01-21 NOTE — Telephone Encounter (Signed)
Called to fu on canceled post hosp fup for Monday. Wife states pt went wed for catherization. Pt has 4 blockages. Surgery on monday

## 2014-01-21 NOTE — Progress Notes (Signed)
ANTICOAGULATION CONSULT NOTE - Follow Up Consult  Pharmacy Consult for Heparin Indication: atrial fibrillation  Allergies  Allergen Reactions  . Sulfonamide Derivatives     REACTION: n/v    Patient Measurements: Height: 5\' 11"  (180.3 cm) Weight: 206 lb 9.1 oz (93.7 kg) IBW/kg (Calculated) : 75.3 Heparin Dosing Weight: 91 kg  Vital Signs: Temp: 97.6 F (36.4 C) (10/09 1500) Temp Source: Oral (10/09 1500) BP: 120/60 mmHg (10/09 1758) Pulse Rate: 86 (10/09 1758)  Labs:  Recent Labs  02/08/2014 1210  01/25/2014 1947 01/20/14 0358  01/21/14 0030 01/21/14 0337 01/21/14 1130 01/21/14 1723 01/21/14 2035  HGB  --   < > 14.2 13.7  --   --  14.0  --   --   --   HCT  --   --  41.6 41.1  --   --  41.3  --   --   --   PLT  --   --  211 228  --   --  204  --   --   --   LABPROT 15.3*  --   --  16.2*  --   --   --   --   --   --   INR 1.21  --   --  1.30  --   --   --   --   --   --   HEPARINUNFRC  --   --   --   --   < > 1.13*  --  0.98*  --  0.66  CREATININE  --   --  1.41* 1.43*  --   --   --   --  1.61*  --   < > = values in this interval not displayed.  Estimated Creatinine Clearance: 42.1 ml/min (by C-G formula based on Cr of 1.61).  Assessment: 80 year old man on Warfarin PTA for AFIB is s/p cardiac cath 10/7 to be evaluated for possible CABG.  HL is therapeutic at 0.66 on 1000 units/hr of heparin.  Goal of Therapy:  Heparin level 0.3-0.7 units/ml Monitor platelets by anticoagulation protocol: Yes   Plan:  Continue IV heparin 1000 units/hr Recheck heparin level in 6 hrs. Planning for CABG and Maze on Monday  Andrey Cota. Diona Foley, PharmD Clinical Pharmacist Pager [phone] 01/21/2014,9:35 PM

## 2014-01-21 NOTE — Care Management Note (Unsigned)
    Page 1 of 1   01/26/2014     11:34:20 AM CARE MANAGEMENT NOTE 01/26/2014  Patient:  Francisco Merritt, Francisco Merritt   Account Number:  [id number]  Date Initiated:  01/21/2014  Documentation initiated by:  Brynnan Rodenbaugh  Subjective/Objective Assessment:   Pt adm on 02/07/2014 for cath which showed severe 3 vessel disease; for CABG on Monday.  PTA, pt independent, lives with spouse.     Action/Plan:   Will follow post op for dc needs.   Anticipated DC Date:  01/30/2014   Anticipated DC Plan:  [place]  CM consult      Choice offered to / List presented to:             Status of service:  In process, will continue to follow Medicare Important Message given?  YES (If response is "NO", the following Medicare IM given date fields will be blank) Date Medicare IM given:  02/09/2014 Medicare IM given by:  Ellarae Nevitt Date Additional Medicare IM given:   Additional Medicare IM given by:    Discharge Disposition:    Per UR Regulation:  Reviewed for med. necessity/level of care/duration of stay  If discussed at [place] of Stay Meetings, dates discussed:    Comments:  01/26/14 Ellan Lambert, RN, BSN [phone] Renal issues resolving.  CABG scheduled for 10/15.  01/17/2014 Ellan Lambert, RN, BSN [phone] CABG cancelled secondary to rising creatinine.  Will follow progress.

## 2014-01-22 ENCOUNTER — Inpatient Hospital Stay (HOSPITAL_COMMUNITY): Payer: Medicare HMO

## 2014-01-22 DIAGNOSIS — I482 Chronic atrial fibrillation: Secondary | ICD-10-CM

## 2014-01-22 DIAGNOSIS — I25810 Atherosclerosis of coronary artery bypass graft(s) without angina pectoris: Secondary | ICD-10-CM

## 2014-01-22 DIAGNOSIS — I5022 Chronic systolic (congestive) heart failure: Secondary | ICD-10-CM

## 2014-01-22 LAB — URINALYSIS, ROUTINE W REFLEX MICROSCOPIC
BILIRUBIN URINE: NEGATIVE
GLUCOSE, UA: NEGATIVE mg/dL
HGB URINE DIPSTICK: NEGATIVE
Ketones, ur: NEGATIVE mg/dL
Leukocytes, UA: NEGATIVE
Nitrite: NEGATIVE
PROTEIN: NEGATIVE mg/dL
SPECIFIC GRAVITY, URINE: 1.011 (ref 1.005–1.030)
UROBILINOGEN UA: 0.2 mg/dL (ref 0.0–1.0)
pH: 5 (ref 5.0–8.0)

## 2014-01-22 LAB — ABO/RH: ABO/RH(D): O NEG

## 2014-01-22 LAB — CBC
HCT: 39 % (ref 39.0–52.0)
Hemoglobin: 13.1 g/dL (ref 13.0–17.0)
MCH: 32.2 pg (ref 26.0–34.0)
MCHC: 33.6 g/dL (ref 30.0–36.0)
MCV: 95.8 fL (ref 78.0–100.0)
PLATELETS: 215 10*3/uL (ref 150–400)
RBC: 4.07 MIL/uL — AB (ref 4.22–5.81)
RDW: 13.6 % (ref 11.5–15.5)
WBC: 10.8 10*3/uL — ABNORMAL HIGH (ref 4.0–10.5)

## 2014-01-22 LAB — COMPREHENSIVE METABOLIC PANEL
ALK PHOS: 54 U/L (ref 39–117)
ALT: 11 U/L (ref 0–53)
AST: 19 U/L (ref 0–37)
Albumin: 3.2 g/dL — ABNORMAL LOW (ref 3.5–5.2)
Anion gap: 15 (ref 5–15)
BUN: 26 mg/dL — ABNORMAL HIGH (ref 6–23)
CALCIUM: 9 mg/dL (ref 8.4–10.5)
CO2: 20 mEq/L (ref 19–32)
Chloride: 100 mEq/L (ref 96–112)
Creatinine, Ser: 1.75 mg/dL — ABNORMAL HIGH (ref 0.50–1.35)
GFR calc Af Amer: 40 mL/min — ABNORMAL LOW (ref >90)
GFR, EST NON AFRICAN AMERICAN: 35 mL/min — AB (ref >90)
Glucose, Bld: 112 mg/dL — ABNORMAL HIGH (ref 70–99)
Potassium: 4 mEq/L (ref 3.7–5.3)
SODIUM: 135 meq/L — AB (ref 137–147)
Total Bilirubin: 0.6 mg/dL (ref 0.3–1.2)
Total Protein: 6.7 g/dL (ref 6.0–8.3)

## 2014-01-22 LAB — TYPE AND SCREEN
ABO/RH(D): O NEG
Antibody Screen: NEGATIVE

## 2014-01-22 LAB — HEPARIN LEVEL (UNFRACTIONATED)
HEPARIN UNFRACTIONATED: 0.75 [IU]/mL — AB (ref 0.30–0.70)
HEPARIN UNFRACTIONATED: 0.59 [IU]/mL (ref 0.30–0.70)
Heparin Unfractionated: 0.76 IU/mL — ABNORMAL HIGH (ref 0.30–0.70)

## 2014-01-22 MED ORDER — METOPROLOL TARTRATE 12.5 MG HALF TABLET
12.5000 mg | ORAL_TABLET | Freq: Once | ORAL | Status: AC
  Administered 2014-01-24: 12.5 mg via ORAL
  Filled 2014-01-22: qty 1

## 2014-01-22 MED ORDER — CHLORHEXIDINE GLUCONATE CLOTH 2 % EX PADS
6.0000 | MEDICATED_PAD | Freq: Once | CUTANEOUS | Status: AC
  Administered 2014-01-24: 6 via TOPICAL

## 2014-01-22 MED ORDER — ALPRAZOLAM 0.25 MG PO TABS: 0.2500 mg | ORAL_TABLET | Freq: Three times a day (TID) | ORAL | Status: DC | PRN

## 2014-01-22 MED ORDER — CHLORHEXIDINE GLUCONATE CLOTH 2 % EX PADS
6.0000 | MEDICATED_PAD | Freq: Once | CUTANEOUS | Status: AC
  Administered 2014-01-23: 6 via TOPICAL

## 2014-01-22 MED ORDER — DIAZEPAM 2 MG PO TABS
2.0000 mg | ORAL_TABLET | Freq: Once | ORAL | Status: AC
  Administered 2014-01-24: 2 mg via ORAL
  Filled 2014-01-22: qty 1

## 2014-01-22 MED ORDER — TEMAZEPAM 15 MG PO CAPS: 15.0000 mg | ORAL_CAPSULE | Freq: Once | ORAL | Status: AC | PRN

## 2014-01-22 MED ORDER — BISACODYL 5 MG PO TBEC
5.0000 mg | DELAYED_RELEASE_TABLET | Freq: Once | ORAL | Status: AC
  Administered 2014-01-23: 5 mg via ORAL
  Filled 2014-01-22: qty 1

## 2014-01-22 NOTE — Plan of Care (Signed)
Problem: Phase I - Pre-Op Goal: Point person for discharge identified Outcome: Completed/Met Date Met:  01/22/14 Wife at the bedside. Education provided

## 2014-01-22 NOTE — Progress Notes (Addendum)
VASCULAR LAB PRELIMINARY  PRELIMINARY  PRELIMINARY  PRELIMINARY  Pre-op Cardiac Surgery  Carotid Findings:  Bilateral:  1-39% ICA stenosis.  Vertebral artery flow is antegrade.      Upper Extremity Right Left  Brachial Pressures 82 triphasic 78 triphasic  Radial Waveforms triphasic triphasic  Ulnar Waveforms triphasic triphasic  Palmar Arch (Allen's Test) * *   Findings:  *Doppler waveforms obliterate with radial and remain normal with ulnar compressions bilaterally.    Lower  Extremity Right Left  Dorsalis Pedis    Anterior Tibial    Posterior Tibial    Ankle/Brachial Indices      Findings:  Palpable pedal pulses x 4.   Hedaya Latendresse, RVT 01/22/2014, 10:56 AM

## 2014-01-22 NOTE — Progress Notes (Signed)
Advanced Heart Failure Rounding Note   Subjective:     Francisco Merritt is a 80 y.o. male with a history of obesity, hypertension, CAD, paroxysmal atrial fibrillation, complicated by tachybradycardia syndrome and is status post pacemaker implantation and takes coumadin.   Seen in HF clinic last week with worsening EF now 30-35%. Underwent R/L cath 10/7.  Severe 2V CAD. Pending CABG on Monday.  No further CP. R arm sore at site of attempted radial artery cannulation (sheath never inserted as unable to feed wire).   Objective:   Weight Range:  Vital Signs:   Temp:  [97.6 F (36.4 C)-98.3 F (36.8 C)] 98.3 F (36.8 C) (10/09 2157) Pulse Rate:  [49-90] 78 (10/09 2157) Resp:  [18] 18 (10/09 2157) BP: (95-120)/(49-60) 101/56 mmHg (10/09 2157) SpO2:  [94 %-100 %] 94 % (10/09 2157) Weight:  [93.1 kg (205 lb 4 oz)] 93.1 kg (205 lb 4 oz) (10/10 0701) Last BM Date: 01/21/14  Weight change: Filed Weights   01/20/14 0440 01/21/14 0500 01/22/14 0701  Weight: 96.9 kg (213 lb 10 oz) 93.7 kg (206 lb 9.1 oz) 93.1 kg (205 lb 4 oz)    Intake/Output:   Intake/Output Summary (Last 24 hours) at 01/22/14 1029 Last data filed at 01/22/14 0700  Gross per 24 hour  Intake    597 ml  Output   1325 ml  Net   -728 ml     Physical Exam: General:  Elderly. Lying in bed No resp difficulty HEENT: normal Neck: supple. JVP 7 . Carotids 2+ bilat; no bruits. No lymphadenopathy or thryomegaly appreciated. Cor: PMI nondisplaced. Irregular rate & rhythm. No rubs, gallops or murmurs. Lungs: clear Abdomen: soft, nontender, nondistended. No hepatosplenomegaly. No bruits or masses. Good bowel sounds. Extremities: no cyanosis, clubbing, rash, edema  condom cath on  RUE bruising at radial artery site. Tender to palpation. Good pulse. No bruit. Neurovasc intact in hand Neuro: alert & orientedx3, cranial nerves grossly intact. moves all 4 extremities w/o difficulty. Affect pleasant  Telemetry:  AF  Labs: Basic  Metabolic Panel:  Recent Labs Lab 01/31/2014 1947 01/20/14 0358 01/21/14 1723  NA 137 140 137  K 3.7 4.0 4.1  CL 102 103 101  CO2 21 20 20   GLUCOSE 139* 100* 109*  BUN 18 19 24*  CREATININE 1.41* 1.43* 1.61*  CALCIUM 9.0 8.9 9.3    Liver Function Tests: No results found for this basename: AST, ALT, ALKPHOS, BILITOT, PROT, ALBUMIN,  in the last 168 hours No results found for this basename: LIPASE, AMYLASE,  in the last 168 hours No results found for this basename: AMMONIA,  in the last 168 hours  CBC:  Recent Labs Lab 01/20/2014 1947 01/20/14 0358 01/21/14 0337 01/22/14 0225  WBC 10.9* 8.9 8.5 10.8*  HGB 14.2 13.7 14.0 13.1  HCT 41.6 41.1 41.3 39.0  MCV 96.1 95.8 95.6 95.8  PLT 211 228 204 215    Cardiac Enzymes: No results found for this basename: CKTOTAL, CKMB, CKMBINDEX, TROPONINI,  in the last 168 hours  BNP: BNP (last 3 results)  Recent Labs  01/06/14 1305  PROBNP 2171.0*     Other results:    Imaging: No results found.   Medications:     Scheduled Medications: . amiodarone  400 mg Oral BID  . aspirin EC  81 mg Oral Daily  . atorvastatin  40 mg Oral q1800  . bisacodyl  5 mg Oral Once  . carvedilol  6.25 mg Oral BID WC  .  Chlorhexidine Gluconate Cloth  6 each Topical Once   And  . Chlorhexidine Gluconate Cloth  6 each Topical Once  . [START ON 01/16/2014] diazepam  2 mg Oral Once  . furosemide  40 mg Intravenous Once  . furosemide  40 mg Oral Daily  . isosorbide mononitrate  30 mg Oral Daily  . [START ON 02/12/2014] metoprolol tartrate  12.5 mg Oral Once  . multivitamin with minerals  1 tablet Oral Daily  . pantoprazole  40 mg Oral Daily  . tamsulosin  0.4 mg Oral QPC supper    Infusions: . heparin 900 Units/hr (01/22/14 0348)    PRN Medications: sodium chloride, acetaminophen, ALPRAZolam, nitroGLYCERIN, ondansetron (ZOFRAN) IV, temazepam, traMADol   Assessment:   1. Coronary artery disease 2. A/c systolic HF 3, iCM EF  03-55% 4. [name] 5. CKD, stage III  Plan/Discussion:     Doing well. Plan for CABG and Maze on Monday. Will continue IV heparin and po amiodarone. Will stay in house until CABG. Start ACE post-op.  Volume status looks good. Continue po diuretics.   We ultrasounded R radial artery at bedside. Good triphasic flow. No pseudoaneurysm. Will provide ice pack.   Sharlotte Baka,MD 10:29 AM

## 2014-01-22 NOTE — Progress Notes (Signed)
Patient c/o of hand and 1/2 way up arm hurts when he turns his arm it is a sharp pain. 3 plus radials pulses and warm to touch. Moves and feels fingers. Blanches. Grip is weaker than right. He can hold cup and fruit. Just weak.

## 2014-01-22 NOTE — Progress Notes (Signed)
ANTICOAGULATION CONSULT NOTE - Initial Consult  Pharmacy Consult for Heparin Indication: atrial fibrillation  Allergies  Allergen Reactions  . Sulfonamide Derivatives     REACTION: n/v    Patient Measurements: Height: 5\' 11"  (180.3 cm) Weight: 205 lb 4 oz (93.1 kg) IBW/kg (Calculated) : 75.3 Heparin Dosing Weight: 91 kg  Vital Signs:    Labs:  Recent Labs  01/20/14 0358  01/21/14 0337  01/21/14 1723 01/21/14 2035 01/22/14 0225 01/22/14 1203 01/22/14 1230  HGB 13.7  --  14.0  --   --   --  13.1  --   --   HCT 41.1  --  41.3  --   --   --  39.0  --   --   PLT 228  --  204  --   --   --  215  --   --   LABPROT 16.2*  --   --   --   --   --   --   --   --   INR 1.30  --   --   --   --   --   --   --   --   HEPARINUNFRC  --   < >  --   < >  --  0.66 0.75*  --  0.76*  CREATININE 1.43*  --   --   --  1.61*  --   --  1.75*  --   < > = values in this interval not displayed.  Estimated Creatinine Clearance: 38.6 ml/min (by C-G formula based on Cr of 1.75).   Medical History: Past Medical History  Diagnosis Date  . Paroxysmal atrial fibrillation     s/p [employer] Insignia pacemaker; started sotalol 06/2009  . Tachy-brady syndrome   . Coronary artery disease     one vessel CAD with normal EF; Cath 12/2006. Ef normal. LM nl, LAD 40%, D2 occulded with collaterals, LCX 40%, RCA 30%; Echo 02/2009 EF 41-66% grade I diastolic dysfunction.  Marland Kitchen History of gallstones     status post cholecystectomy  . Hypertension   . Hyperlipidemia   . History of peptic ulcer disease   . Benign prostatic hypertrophy   . Obesity   . Diverticulosis of colon   . Testosterone deficiency   . ED (erectile dysfunction)   . Atrial fibrillation   . Nephrolithiasis     "passed it"  . Osteoarthritis     "legs and lower back" (01/13/2014)  . Chronic lower back pain     Assessment: 80 year old man on Warfarin PTA for AFIB is s/p cardiac cath 10/7. CABG 10/12.  AC: War>>Heparin. Heparin level 0.76  remains elevated. Hgb 13.1. Plts stable. - PTA Coumadin dose 5mg  Sun, 2.5mg  other days.  Card: hx of CAD, HLD, HTN, HF, PPM, Afib. Cath=2VCAD. EF 30-35%.VSS. Meds: Amio po, ASA81, Lipitor40, Coreg, Lasix, Imdur, metoprolol  Endo/GI: Obesity. on protonix  Hem/Onc: CBC stable  Renal: SCr 1.75 up (CrCl ~38) s/p contrast; other lytes ok. flomax  Home meds: coumadin and lisinopril  Best practice: heparin  Goal of Therapy:  Heparin level 0.3-0.7 units/ml Monitor platelets by anticoagulation protocol: Yes   Plan:  Decrease IV heparin to 750 units/hr Recheck heparin level in 6 hrs.   Kimberley Speece S. Alford Highland, PharmD, BCPS Clinical Staff Pharmacist Pager [phone]  Eilene Ghazi Stillinger 01/22/2014,2:24 PM

## 2014-01-22 NOTE — Progress Notes (Signed)
ANTICOAGULATION CONSULT NOTE - Follow Up Consult  Pharmacy Consult for heparin Indication: atrial fibrillation  Labs:  Recent Labs  01/24/2014 1210  01/15/2014 1947 01/20/14 0358  01/21/14 0337 01/21/14 1130 01/21/14 1723 01/21/14 2035 01/22/14 0225  HGB  --   < > 14.2 13.7  --  14.0  --   --   --  13.1  HCT  --   < > 41.6 41.1  --  41.3  --   --   --  39.0  PLT  --   < > 211 228  --  204  --   --   --  215  LABPROT 15.3*  --   --  16.2*  --   --   --   --   --   --   INR 1.21  --   --  1.30  --   --   --   --   --   --   HEPARINUNFRC  --   --   --   --   < >  --  0.98*  --  0.66 0.75*  CREATININE  --   --  1.41* 1.43*  --   --   --  1.61*  --   --   < > = values in this interval not displayed.   Assessment: 80yo male now supratherapeutic on heparin one level at goal, still accumulating.  Goal of Therapy:  Heparin level 0.3-0.7 units/ml   Plan:  Will decrease heparin gtt by 1 unit/kg/hr to 900 units/hr and check level in [place], PharmD, BCPS  01/22/2014,3:45 AM

## 2014-01-22 NOTE — Progress Notes (Signed)
3 Days Post-Op Procedure(s) (LRB): LEFT AND RIGHT HEART CATHETERIZATION WITH CORONARY ANGIOGRAM (N/A) Subjective: C/o pain at cath site right radial artery and hand  Objective: Vital signs in last 24 hours: Temp:  [97.6 F (36.4 C)-98.3 F (36.8 C)] 98.3 F (36.8 C) (10/09 2157) Pulse Rate:  [49-90] 78 (10/09 2157) Cardiac Rhythm:  [-] Ventricular paced;Atrial fibrillation (10/10 0812) Resp:  [18] 18 (10/09 2157) BP: (95-120)/(49-60) 101/56 mmHg (10/09 2157) SpO2:  [94 %-100 %] 94 % (10/09 2157) Weight:  [205 lb 4 oz (93.1 kg)] 205 lb 4 oz (93.1 kg) (10/10 0701)  Hemodynamic parameters for last 24 hours:    Intake/Output from previous day: 10/09 0701 - 10/10 0700 In: 597 [P.O.:480; I.V.:117] Out: 1326 [Urine:1325; Stool:1] Intake/Output this shift:    General appearance: alert and no distress Neurologic: intact Heart: irregularly irregular rhythm Lungs: clear to auscultation bilaterally Extremities: 2+ pulse right wrist, brisk cap refill, sensory intact  Lab Results:  Recent Labs  01/21/14 0337 01/22/14 0225  WBC 8.5 10.8*  HGB 14.0 13.1  HCT 41.3 39.0  PLT 204 215   BMET:  Recent Labs  01/20/14 0358 01/21/14 1723  NA 140 137  K 4.0 4.1  CL 103 101  CO2 20 20  GLUCOSE 100* 109*  BUN 19 24*  CREATININE 1.43* 1.61*  CALCIUM 8.9 9.3    PT/INR:  Recent Labs  01/20/14 0358  LABPROT 16.2*  INR 1.30   ABG    Component Value Date/Time   PHART 7.395 01/24/2014 1607   HCO3 21.7 01/16/2014 1607   TCO2 23 01/16/2014 1607   ACIDBASEDEF 3.0* 02/11/2014 1607   O2SAT 95.0 01/29/2014 1607   CBG (last 3)  No results found for this basename: GLUCAP,  in the last 72 hours  Assessment/Plan: S/P Procedure(s) (LRB): LEFT AND RIGHT HEART CATHETERIZATION WITH CORONARY ANGIOGRAM (N/A) CAD and atrial fibrillation For CABG/ maze on Monday Creatinine elevated this AM- up from 1.4 to 1.6- repeat in AM- could delay surgery of continues to rise Needs preoperative  carotid duplex   LOS: 3 days    Francisco Merritt C 01/22/2014

## 2014-01-22 NOTE — Progress Notes (Signed)
ANTICOAGULATION CONSULT NOTE - f/u Consult  Pharmacy Consult for Heparin Indication: atrial fibrillation  Allergies  Allergen Reactions  . Sulfonamide Derivatives     REACTION: n/v    Patient Measurements: Height: 5\' 11"  (180.3 cm) Weight: 205 lb 4 oz (93.1 kg) IBW/kg (Calculated) : 75.3 Heparin Dosing Weight: 91 kg  Vital Signs: Temp: 97.8 F (36.6 C) (10/10 2004) Temp Source: Oral (10/10 2004) BP: 107/71 mmHg (10/10 2004) Pulse Rate: 84 (10/10 2004)  Labs:  Recent Labs  01/20/14 0358  01/21/14 0337  01/21/14 1723  01/22/14 0225 01/22/14 1203 01/22/14 1230 01/22/14 2027  HGB 13.7  --  14.0  --   --   --  13.1  --   --   --   HCT 41.1  --  41.3  --   --   --  39.0  --   --   --   PLT 228  --  204  --   --   --  215  --   --   --   LABPROT 16.2*  --   --   --   --   --   --   --   --   --   INR 1.30  --   --   --   --   --   --   --   --   --   HEPARINUNFRC  --   < >  --   < >  --   < > 0.75*  --  0.76* 0.59  CREATININE 1.43*  --   --   --  1.61*  --   --  1.75*  --   --   < > = values in this interval not displayed.  Estimated Creatinine Clearance: 38.6 ml/min (by C-G formula based on Cr of 1.75).   Medical History: Past Medical History  Diagnosis Date  . Paroxysmal atrial fibrillation     s/p [employer] Insignia pacemaker; started sotalol 06/2009  . Tachy-brady syndrome   . Coronary artery disease     one vessel CAD with normal EF; Cath 12/2006. Ef normal. LM nl, LAD 40%, D2 occulded with collaterals, LCX 40%, RCA 30%; Echo 02/2009 EF 02-63% grade I diastolic dysfunction.  Marland Kitchen History of gallstones     status post cholecystectomy  . Hypertension   . Hyperlipidemia   . History of peptic ulcer disease   . Benign prostatic hypertrophy   . Obesity   . Diverticulosis of colon   . Testosterone deficiency   . ED (erectile dysfunction)   . Atrial fibrillation   . Nephrolithiasis     "passed it"  . Osteoarthritis     "legs and lower back" (01/18/2014)  . Chronic  lower back pain     Assessment: 80 year old man on Warfarin PTA for AFIB is s/p cardiac cath 10/7. CABG 10/12.  AC: War>>Heparin. Heparin level 0.56 now in goal range.  Goal of Therapy:  Heparin level 0.3-0.7 units/ml Monitor platelets by anticoagulation protocol: Yes   Plan:  Continue IV heparin at 750 units/hr   Erick Murin S. Alford Highland, PharmD, [place] Clinical Staff Pharmacist Pager [phone]  [place], [place] 01/22/2014,9:09 PM

## 2014-01-22 NOTE — Progress Notes (Signed)
CARDIAC REHAB PHASE I   11:19am-11:45am  Patient's family at bedside.  Educated patient on surgery. I'll come back later once family leaves to go for walk. Put #112 video on for patient and family.  Vita Erm, MS 01/22/2014 11:43 AM

## 2014-01-23 LAB — BASIC METABOLIC PANEL
ANION GAP: 11 (ref 5–15)
BUN: 28 mg/dL — ABNORMAL HIGH (ref 6–23)
CHLORIDE: 104 meq/L (ref 96–112)
CO2: 21 mEq/L (ref 19–32)
Calcium: 8.9 mg/dL (ref 8.4–10.5)
Creatinine, Ser: 1.71 mg/dL — ABNORMAL HIGH (ref 0.50–1.35)
GFR calc Af Amer: 41 mL/min — ABNORMAL LOW (ref >90)
GFR calc non Af Amer: 36 mL/min — ABNORMAL LOW (ref >90)
Glucose, Bld: 102 mg/dL — ABNORMAL HIGH (ref 70–99)
Potassium: 4.2 mEq/L (ref 3.7–5.3)
SODIUM: 136 meq/L — AB (ref 137–147)

## 2014-01-23 LAB — CBC
HEMATOCRIT: 36.5 % — AB (ref 39.0–52.0)
HEMOGLOBIN: 12.4 g/dL — AB (ref 13.0–17.0)
MCH: 32.5 pg (ref 26.0–34.0)
MCHC: 34 g/dL (ref 30.0–36.0)
MCV: 95.8 fL (ref 78.0–100.0)
Platelets: 203 10*3/uL (ref 150–400)
RBC: 3.81 MIL/uL — AB (ref 4.22–5.81)
RDW: 13.4 % (ref 11.5–15.5)
WBC: 11.3 10*3/uL — ABNORMAL HIGH (ref 4.0–10.5)

## 2014-01-23 LAB — HEPARIN LEVEL (UNFRACTIONATED): Heparin Unfractionated: 0.5 IU/mL (ref 0.30–0.70)

## 2014-01-23 MED ORDER — SODIUM CHLORIDE 0.9 % IV SOLN
INTRAVENOUS | Status: AC
  Filled 2014-01-23: qty 2.5

## 2014-01-23 MED ORDER — PHENYLEPHRINE HCL 10 MG/ML IJ SOLN
30.0000 ug/min | INTRAVENOUS | Status: AC
  Filled 2014-01-23: qty 2

## 2014-01-23 MED ORDER — HEPARIN SODIUM (PORCINE) 5000 UNIT/ML IJ SOLN
5000.0000 [IU] | Freq: Three times a day (TID) | INTRAMUSCULAR | Status: DC
  Administered 2014-01-23 (×2): 5000 [IU] via SUBCUTANEOUS
  Filled 2014-01-23 (×4): qty 1

## 2014-01-23 MED ORDER — DEXTROSE 5 % IV SOLN
1.5000 g | INTRAVENOUS | Status: AC
  Filled 2014-01-23: qty 1.5

## 2014-01-23 MED ORDER — KETOROLAC TROMETHAMINE 30 MG/ML IJ SOLN
30.0000 mg | Freq: Once | INTRAMUSCULAR | Status: AC
  Administered 2014-01-23: 30 mg via INTRAVENOUS
  Filled 2014-01-23: qty 1

## 2014-01-23 MED ORDER — SODIUM CHLORIDE 0.9 % IV SOLN
INTRAVENOUS | Status: AC
  Filled 2014-01-23: qty 40

## 2014-01-23 MED ORDER — VANCOMYCIN HCL 10 G IV SOLR
1250.0000 mg | INTRAVENOUS | Status: AC
  Filled 2014-01-23: qty 1250

## 2014-01-23 MED ORDER — DOPAMINE-DEXTROSE 3.2-5 MG/ML-% IV SOLN
2.0000 ug/kg/min | INTRAVENOUS | Status: AC
  Filled 2014-01-23: qty 250

## 2014-01-23 MED ORDER — DEXTROSE 5 % IV SOLN
750.0000 mg | INTRAVENOUS | Status: AC
  Filled 2014-01-23: qty 750

## 2014-01-23 MED ORDER — DEXMEDETOMIDINE HCL IN NACL 400 MCG/100ML IV SOLN
0.1000 ug/kg/h | INTRAVENOUS | Status: AC
  Filled 2014-01-23: qty 100

## 2014-01-23 MED ORDER — OXYCODONE-ACETAMINOPHEN 5-325 MG PO TABS: 1.0000 | ORAL_TABLET | ORAL | Status: DC | PRN

## 2014-01-23 MED ORDER — MAGNESIUM SULFATE 50 % IJ SOLN
40.0000 meq | INTRAMUSCULAR | Status: AC
  Filled 2014-01-23: qty 10

## 2014-01-23 MED ORDER — SODIUM CHLORIDE 0.9 % IV SOLN
INTRAVENOUS | Status: AC
  Filled 2014-01-23: qty 30

## 2014-01-23 MED ORDER — EPINEPHRINE HCL 1 MG/ML IJ SOLN
0.5000 ug/min | INTRAVENOUS | Status: AC
  Filled 2014-01-23: qty 4

## 2014-01-23 MED ORDER — SODIUM CHLORIDE 0.9 % IV BOLUS (SEPSIS)
500.0000 mL | Freq: Once | INTRAVENOUS | Status: AC
  Administered 2014-01-23: 500 mL via INTRAVENOUS

## 2014-01-23 MED ORDER — PLASMA-LYTE 148 IV SOLN
INTRAVENOUS | Status: AC
  Filled 2014-01-23: qty 2.5

## 2014-01-23 MED ORDER — NITROGLYCERIN IN D5W 200-5 MCG/ML-% IV SOLN
2.0000 ug/min | INTRAVENOUS | Status: AC
  Filled 2014-01-23: qty 250

## 2014-01-23 MED ORDER — POTASSIUM CHLORIDE 2 MEQ/ML IV SOLN
80.0000 meq | INTRAVENOUS | Status: AC
  Filled 2014-01-23: qty 40

## 2014-01-23 NOTE — Progress Notes (Addendum)
Advanced Heart Failure Rounding Note   Subjective:     Francisco Merritt is a 80 y.o. male with a history of obesity, hypertension, CAD, paroxysmal atrial fibrillation, complicated by tachybradycardia syndrome and is status post pacemaker implantation and takes coumadin.   Seen in HF clinic last week with worsening EF now 30-35%. Underwent R/L cath 10/7.  Severe 2V CAD. Pending CABG on Monday.  No further CP. R arm continues to be sore at site of attempted radial artery cannulation (sheath never inserted as unable to feed wire). Hard to use hand due to pain  Objective:   Weight Range:  Vital Signs:   Temp:  [97.6 F (36.4 C)-98.6 F (37 C)] 98.6 F (37 C) (10/11 0510) Pulse Rate:  [80-84] 81 (10/11 0510) Resp:  [17-18] 17 (10/11 0510) BP: (99-120)/(55-73) 120/73 mmHg (10/11 0510) SpO2:  [94 %-97 %] 97 % (10/11 0510) Weight:  [93.9 kg (207 lb 0.2 oz)] 93.9 kg (207 lb 0.2 oz) (10/11 0510) Last BM Date: 01/21/14  Weight change: Filed Weights   01/21/14 0500 01/22/14 0701 01/23/14 0510  Weight: 93.7 kg (206 lb 9.1 oz) 93.1 kg (205 lb 4 oz) 93.9 kg (207 lb 0.2 oz)    Intake/Output:   Intake/Output Summary (Last 24 hours) at 01/23/14 0824 Last data filed at 01/23/14 0512  Gross per 24 hour  Intake    600 ml  Output   1325 ml  Net   -725 ml     Physical Exam: General:  Elderly. Sitting up in bed No resp difficulty HEENT: normal Neck: supple. JVP 7 . Carotids 2+ bilat; no bruits. No lymphadenopathy or thryomegaly appreciated. Cor: PMI nondisplaced. Irregular rate & rhythm. No rubs, gallops or murmurs. Lungs: clear Abdomen: soft, nontender, nondistended. No hepatosplenomegaly. No bruits or masses. Good bowel sounds. Extremities: no cyanosis, clubbing, rash, edema  condom cath on  RUE bruising at radial artery site. Tender to palpation. Good pulse. No bruit. Neurovasc intact in hand Neuro: alert & orientedx3, cranial nerves grossly intact. moves all 4 extremities w/o difficulty.  Affect pleasant  Telemetry:  AF  Labs: Basic Metabolic Panel:  Recent Labs Lab 02/03/2014 1947 01/20/14 0358 01/21/14 1723 01/22/14 1203  NA 137 140 137 135*  K 3.7 4.0 4.1 4.0  CL 102 103 101 100  CO2 21 20 20 20   GLUCOSE 139* 100* 109* 112*  BUN 18 19 24* 26*  CREATININE 1.41* 1.43* 1.61* 1.75*  CALCIUM 9.0 8.9 9.3 9.0    Liver Function Tests:  Recent Labs Lab 01/22/14 1203  AST 19  ALT 11  ALKPHOS 54  BILITOT 0.6  PROT 6.7  ALBUMIN 3.2*   No results found for this basename: LIPASE, AMYLASE,  in the last 168 hours No results found for this basename: AMMONIA,  in the last 168 hours  CBC:  Recent Labs Lab 01/16/2014 1947 01/20/14 0358 01/21/14 0337 01/22/14 0225 01/23/14 0350  WBC 10.9* 8.9 8.5 10.8* 11.3*  HGB 14.2 13.7 14.0 13.1 12.4*  HCT 41.6 41.1 41.3 39.0 36.5*  MCV 96.1 95.8 95.6 95.8 95.8  PLT 211 228 204 215 203    Cardiac Enzymes: No results found for this basename: CKTOTAL, CKMB, CKMBINDEX, TROPONINI,  in the last 168 hours  BNP: BNP (last 3 results)  Recent Labs  01/06/14 1305  PROBNP 2171.0*     Other results:    Imaging: Dg Chest 2 View  01/22/2014   CLINICAL DATA:  Coronary artery disease  EXAM: CHEST  2 VIEW  COMPARISON:  01/06/2014  FINDINGS: Cardiomegaly again noted. Dual lead cardiac pacemaker is unchanged in position. Mild basilar atelectasis. No acute infiltrate or pulmonary edema. Thoracic spine osteopenia.  IMPRESSION: No active disease. Mild basilar atelectasis. Cardiomegaly again noted.   Electronically Signed   By: Lahoma Crocker M.D.   On: 01/22/2014 16:27     Medications:     Scheduled Medications: . amiodarone  400 mg Oral BID  . aspirin EC  81 mg Oral Daily  . atorvastatin  40 mg Oral q1800  . bisacodyl  5 mg Oral Once  . carvedilol  6.25 mg Oral BID WC  . Chlorhexidine Gluconate Cloth  6 each Topical Once   And  . [START ON 02/04/2014] Chlorhexidine Gluconate Cloth  6 each Topical Once  . [START ON  01/14/2014] diazepam  2 mg Oral Once  . furosemide  40 mg Intravenous Once  . furosemide  40 mg Oral Daily  . isosorbide mononitrate  30 mg Oral Daily  . [START ON 01/19/2014] metoprolol tartrate  12.5 mg Oral Once  . multivitamin with minerals  1 tablet Oral Daily  . pantoprazole  40 mg Oral Daily  . tamsulosin  0.4 mg Oral QPC supper    Infusions: . heparin 750 Units/hr (01/22/14 2033)    PRN Medications: sodium chloride, acetaminophen, ALPRAZolam, nitroGLYCERIN, ondansetron (ZOFRAN) IV, temazepam, traMADol   Assessment:   1. Coronary artery disease 2. A/c systolic HF 3, iCM EF 03-55% 4. [name] 5. CKD, stage III 6. R arm hematoma  Plan/Discussion:     Doing well except for R forearm hematoma. Plan for CABG and Maze tomorrow. Will continue po amiodarone. Stop heparin due to hematoma.   Start ACE post-op.  Volume status looks good. Continue po diuretics.   We ultrasounded R radial artery at bedside yesterday. Good triphasic flow. No pseudoaneurysm. No evidence of neurovascular compromise or compartment syndrome. Will elevate. Pain control. If getting worse will ask vascular to see.  Daniel Bensimhon,MD 8:24 AM  Addendum: Discussed with Dr. Roxan Hockey. Cr up slightly. Po lasix stopped but he already got dose today. Will give NS 500cc bolus.   Daniel Bensimhon,MD 10:44 AM

## 2014-01-24 ENCOUNTER — Encounter (HOSPITAL_COMMUNITY)
Admission: RE | Disposition: E | Payer: Self-pay | Source: Ambulatory Visit | Attending: Thoracic Surgery (Cardiothoracic Vascular Surgery)

## 2014-01-24 ENCOUNTER — Encounter (HOSPITAL_COMMUNITY): Payer: Medicare HMO | Admitting: Certified Registered"

## 2014-01-24 ENCOUNTER — Inpatient Hospital Stay (HOSPITAL_COMMUNITY): Payer: Medicare HMO

## 2014-01-24 ENCOUNTER — Inpatient Hospital Stay (HOSPITAL_COMMUNITY): Payer: Medicare HMO | Admitting: Certified Registered"

## 2014-01-24 ENCOUNTER — Encounter (HOSPITAL_COMMUNITY): Payer: Self-pay | Admitting: Certified Registered"

## 2014-01-24 DIAGNOSIS — N179 Acute kidney failure, unspecified: Secondary | ICD-10-CM

## 2014-01-24 DIAGNOSIS — S40021A Contusion of right upper arm, initial encounter: Secondary | ICD-10-CM

## 2014-01-24 DIAGNOSIS — N189 Chronic kidney disease, unspecified: Secondary | ICD-10-CM

## 2014-01-24 LAB — SURGICAL PCR SCREEN
MRSA, PCR: NEGATIVE
Staphylococcus aureus: POSITIVE — AB

## 2014-01-24 LAB — BASIC METABOLIC PANEL
ANION GAP: 14 (ref 5–15)
BUN: 31 mg/dL — AB (ref 6–23)
CHLORIDE: 101 meq/L (ref 96–112)
CO2: 23 mEq/L (ref 19–32)
Calcium: 9.1 mg/dL (ref 8.4–10.5)
Creatinine, Ser: 1.96 mg/dL — ABNORMAL HIGH (ref 0.50–1.35)
GFR calc non Af Amer: 30 mL/min — ABNORMAL LOW (ref >90)
GFR, EST AFRICAN AMERICAN: 35 mL/min — AB (ref >90)
Glucose, Bld: 116 mg/dL — ABNORMAL HIGH (ref 70–99)
POTASSIUM: 4 meq/L (ref 3.7–5.3)
Sodium: 138 mEq/L (ref 137–147)

## 2014-01-24 LAB — APTT: aPTT: 36 seconds (ref 24–37)

## 2014-01-24 LAB — HEMOGLOBIN A1C
HEMOGLOBIN A1C: 6 % — AB (ref <5.7)
MEAN PLASMA GLUCOSE: 126 mg/dL — AB (ref <117)

## 2014-01-24 SURGERY — CORONARY ARTERY BYPASS GRAFTING (CABG)
Anesthesia: General

## 2014-01-24 MED ORDER — AMIODARONE HCL 200 MG PO TABS
400.0000 mg | ORAL_TABLET | Freq: Two times a day (BID) | ORAL | Status: DC
  Administered 2014-01-24 – 2014-01-26 (×6): 400 mg via ORAL
  Filled 2014-01-24 (×8): qty 2

## 2014-01-24 MED ORDER — ARTIFICIAL TEARS OP OINT
TOPICAL_OINTMENT | OPHTHALMIC | Status: AC
  Filled 2014-01-24: qty 3.5

## 2014-01-24 MED ORDER — LIDOCAINE HCL (CARDIAC) 20 MG/ML IV SOLN
INTRAVENOUS | Status: AC
  Filled 2014-01-24: qty 5

## 2014-01-24 MED ORDER — FENTANYL CITRATE 0.05 MG/ML IJ SOLN
INTRAMUSCULAR | Status: AC
  Filled 2014-01-24: qty 5

## 2014-01-24 MED ORDER — SODIUM CHLORIDE 0.9 % IJ SOLN
INTRAMUSCULAR | Status: AC
  Filled 2014-01-24: qty 10

## 2014-01-24 MED ORDER — VECURONIUM BROMIDE 10 MG IV SOLR
INTRAVENOUS | Status: AC
  Filled 2014-01-24: qty 10

## 2014-01-24 MED ORDER — HEPARIN SODIUM (PORCINE) 1000 UNIT/ML IJ SOLN
INTRAMUSCULAR | Status: AC
  Filled 2014-01-24: qty 1

## 2014-01-24 MED ORDER — SODIUM CHLORIDE 0.9 % IV BOLUS (SEPSIS)
500.0000 mL | Freq: Once | INTRAVENOUS | Status: AC
  Administered 2014-01-24: 500 mL via INTRAVENOUS

## 2014-01-24 MED ORDER — TAMSULOSIN HCL 0.4 MG PO CAPS
0.4000 mg | ORAL_CAPSULE | Freq: Every day | ORAL | Status: DC
  Administered 2014-01-24 – 2014-01-29 (×5): 0.4 mg via ORAL
  Filled 2014-01-24 (×7): qty 1

## 2014-01-24 MED ORDER — ASPIRIN EC 81 MG PO TBEC
81.0000 mg | DELAYED_RELEASE_TABLET | Freq: Every day | ORAL | Status: DC
  Administered 2014-01-24 – 2014-01-26 (×3): 81 mg via ORAL
  Filled 2014-01-24 (×4): qty 1

## 2014-01-24 MED ORDER — ISOSORBIDE MONONITRATE ER 30 MG PO TB24
30.0000 mg | ORAL_TABLET | Freq: Every day | ORAL | Status: DC
  Administered 2014-01-24 – 2014-01-26 (×3): 30 mg via ORAL
  Filled 2014-01-24 (×4): qty 1

## 2014-01-24 MED ORDER — PANTOPRAZOLE SODIUM 40 MG PO TBEC
40.0000 mg | DELAYED_RELEASE_TABLET | Freq: Every day | ORAL | Status: DC
  Administered 2014-01-24 – 2014-01-26 (×3): 40 mg via ORAL
  Filled 2014-01-24: qty 1

## 2014-01-24 MED ORDER — HEPARIN SODIUM (PORCINE) 5000 UNIT/ML IJ SOLN
5000.0000 [IU] | Freq: Three times a day (TID) | INTRAMUSCULAR | Status: AC
  Administered 2014-01-24 – 2014-01-26 (×8): 5000 [IU] via SUBCUTANEOUS
  Filled 2014-01-24 (×7): qty 1

## 2014-01-24 MED ORDER — ADULT MULTIVITAMIN W/MINERALS CH
1.0000 | ORAL_TABLET | Freq: Every day | ORAL | Status: DC
  Administered 2014-01-24 – 2014-01-26 (×3): 1 via ORAL
  Filled 2014-01-24 (×4): qty 1

## 2014-01-24 MED ORDER — MIDAZOLAM HCL 10 MG/2ML IJ SOLN
INTRAMUSCULAR | Status: AC
  Filled 2014-01-24: qty 2

## 2014-01-24 MED ORDER — ATORVASTATIN CALCIUM 40 MG PO TABS
40.0000 mg | ORAL_TABLET | Freq: Every day | ORAL | Status: DC
  Administered 2014-01-24 – 2014-02-04 (×10): 40 mg via ORAL
  Filled 2014-01-24 (×13): qty 1

## 2014-01-24 MED ORDER — ROCURONIUM BROMIDE 50 MG/5ML IV SOLN
INTRAVENOUS | Status: AC
  Filled 2014-01-24: qty 1

## 2014-01-24 MED ORDER — CARVEDILOL 6.25 MG PO TABS
6.2500 mg | ORAL_TABLET | Freq: Two times a day (BID) | ORAL | Status: DC
  Administered 2014-01-24 – 2014-01-26 (×5): 6.25 mg via ORAL
  Filled 2014-01-24 (×9): qty 1

## 2014-01-24 MED ORDER — PROPOFOL 10 MG/ML IV BOLUS
INTRAVENOUS | Status: AC
  Filled 2014-01-24: qty 20

## 2014-01-24 SURGICAL SUPPLY — 82 items
ATTRACTOMAT 16X20 MAGNETIC DRP (DRAPES) ×4 IMPLANT
BAG DECANTER FOR FLEXI CONT (MISCELLANEOUS) ×5 IMPLANT
BANDAGE ELASTIC 4 VELCRO ST LF (GAUZE/BANDAGES/DRESSINGS) ×5 IMPLANT
BANDAGE ELASTIC 6 VELCRO ST LF (GAUZE/BANDAGES/DRESSINGS) ×5 IMPLANT
BASKET HEART  (ORDER IN 25'S) (MISCELLANEOUS) ×1
BASKET HEART (ORDER IN 25'S) (MISCELLANEOUS) ×1
BASKET HEART (ORDER IN 25S) (MISCELLANEOUS) ×3 IMPLANT
BLADE STERNUM SYSTEM 6 (BLADE) ×5 IMPLANT
BNDG GAUZE ELAST 4 BULKY (GAUZE/BANDAGES/DRESSINGS) ×4 IMPLANT
CANISTER SUCTION 2500CC (MISCELLANEOUS) ×4 IMPLANT
CANNULA EZ GLIDE AORTIC 21FR (CANNULA) ×5 IMPLANT
CARDIAC SUCTION (MISCELLANEOUS) ×5 IMPLANT
CARDIOBLATE CARDIAC ABLATION (MISCELLANEOUS)
CATH CPB KIT HENDRICKSON (MISCELLANEOUS) ×4 IMPLANT
CATH ROBINSON RED A/P 18FR (CATHETERS) ×5 IMPLANT
CATH THORACIC 36FR (CATHETERS) ×5 IMPLANT
CATH THORACIC 36FR RT ANG (CATHETERS) ×5 IMPLANT
CLIP TI MEDIUM 24 (CLIP) IMPLANT
CLIP TI WIDE RED SMALL 24 (CLIP) IMPLANT
COVER SURGICAL LIGHT HANDLE (MISCELLANEOUS) ×5 IMPLANT
CRADLE DONUT ADULT HEAD (MISCELLANEOUS) ×5 IMPLANT
DEVICE CARDIOBLATE CARDIAC ABL (MISCELLANEOUS) IMPLANT
DRAPE CARDIOVASCULAR INCISE (DRAPES) ×4
DRAPE SLUSH/WARMER DISC (DRAPES) ×5 IMPLANT
DRAPE SRG 135X102X78XABS (DRAPES) ×3 IMPLANT
DRSG COVADERM 4X14 (GAUZE/BANDAGES/DRESSINGS) ×5 IMPLANT
ELECT REM PT RETURN 9FT ADLT (ELECTROSURGICAL) ×8
ELECTRODE REM PT RTRN 9FT ADLT (ELECTROSURGICAL) ×6 IMPLANT
GAUZE SPONGE 4X4 12PLY STRL (GAUZE/BANDAGES/DRESSINGS) ×10 IMPLANT
GLOVE SURG SIGNA 7.5 PF LTX (GLOVE) ×12 IMPLANT
GOWN STRL REUS W/ TWL LRG LVL3 (GOWN DISPOSABLE) ×12 IMPLANT
GOWN STRL REUS W/ TWL XL LVL3 (GOWN DISPOSABLE) ×6 IMPLANT
GOWN STRL REUS W/TWL LRG LVL3 (GOWN DISPOSABLE) ×16
GOWN STRL REUS W/TWL XL LVL3 (GOWN DISPOSABLE) ×8
HEMOSTAT POWDER SURGIFOAM 1G (HEMOSTASIS) ×12 IMPLANT
HEMOSTAT SURGICEL 2X14 (HEMOSTASIS) ×5 IMPLANT
INSERT FOGARTY XLG (MISCELLANEOUS) IMPLANT
KIT BASIN OR (CUSTOM PROCEDURE TRAY) ×5 IMPLANT
KIT ROOM TURNOVER OR (KITS) ×5 IMPLANT
KIT SUCTION CATH 14FR (SUCTIONS) ×8 IMPLANT
KIT VASOVIEW W/TROCAR VH 2000 (KITS) ×5 IMPLANT
MARKER GRAFT CORONARY BYPASS (MISCELLANEOUS) ×15 IMPLANT
NS IRRIG 1000ML POUR BTL (IV SOLUTION) ×25 IMPLANT
PACK OPEN HEART (CUSTOM PROCEDURE TRAY) ×5 IMPLANT
PAD ARMBOARD 7.5X6 YLW CONV (MISCELLANEOUS) ×10 IMPLANT
PAD ELECT DEFIB RADIOL ZOLL (MISCELLANEOUS) ×4 IMPLANT
PENCIL BUTTON HOLSTER BLD 10FT (ELECTRODE) ×4 IMPLANT
PROBE CRYO2-ABLATION MALLABLE (MISCELLANEOUS) IMPLANT
PUNCH AORTIC ROTATE 4.0MM (MISCELLANEOUS) IMPLANT
PUNCH AORTIC ROTATE 4.5MM 8IN (MISCELLANEOUS) IMPLANT
PUNCH AORTIC ROTATE 5MM 8IN (MISCELLANEOUS) IMPLANT
SUT BONE WAX W31G (SUTURE) ×5 IMPLANT
SUT MNCRL AB 4-0 PS2 18 (SUTURE) IMPLANT
SUT PROLENE 3 0 SH DA (SUTURE) ×5 IMPLANT
SUT PROLENE 4 0 RB 1 (SUTURE)
SUT PROLENE 4 0 SH DA (SUTURE) IMPLANT
SUT PROLENE 4-0 RB1 .5 CRCL 36 (SUTURE) IMPLANT
SUT PROLENE 6 0 C 1 30 (SUTURE) ×10 IMPLANT
SUT PROLENE 7 0 BV1 MDA (SUTURE) ×4 IMPLANT
SUT PROLENE 8 0 BV175 6 (SUTURE) IMPLANT
SUT STEEL 6MS V (SUTURE) ×5 IMPLANT
SUT STEEL STERNAL CCS#1 18IN (SUTURE) IMPLANT
SUT STEEL SZ 6 DBL 3X14 BALL (SUTURE) ×5 IMPLANT
SUT VIC AB 1 CTX 36 (SUTURE) ×8
SUT VIC AB 1 CTX36XBRD ANBCTR (SUTURE) ×6 IMPLANT
SUT VIC AB 2-0 CT1 27 (SUTURE)
SUT VIC AB 2-0 CT1 TAPERPNT 27 (SUTURE) IMPLANT
SUT VIC AB 2-0 CTX 27 (SUTURE) IMPLANT
SUT VIC AB 3-0 SH 27 (SUTURE)
SUT VIC AB 3-0 SH 27X BRD (SUTURE) IMPLANT
SUT VIC AB 3-0 X1 27 (SUTURE) IMPLANT
SUT VICRYL 4-0 PS2 18IN ABS (SUTURE) IMPLANT
SUTURE E-PAK OPEN HEART (SUTURE) ×4 IMPLANT
SYS ATRICLIP LAA EXCLUSION 45 (CLIP) IMPLANT
SYSTEM SAHARA CHEST DRAIN ATS (WOUND CARE) ×5 IMPLANT
TOWEL OR 17X24 6PK STRL BLUE (TOWEL DISPOSABLE) ×10 IMPLANT
TOWEL OR 17X26 10 PK STRL BLUE (TOWEL DISPOSABLE) ×10 IMPLANT
TRAY FOLEY IC TEMP SENS 16FR (CATHETERS) ×5 IMPLANT
TUBE FEEDING 8FR 16IN STR KANG (MISCELLANEOUS) ×5 IMPLANT
TUBING INSUFFLATION (TUBING) ×4 IMPLANT
UNDERPAD 30X30 INCONTINENT (UNDERPADS AND DIAPERS) ×5 IMPLANT
WATER STERILE IRR 1000ML POUR (IV SOLUTION) ×8 IMPLANT

## 2014-01-24 NOTE — Progress Notes (Addendum)
Advanced Heart Failure Rounding Note   Subjective:     Francisco Merritt is a 80 y.o. male with a history of obesity, hypertension, CAD, paroxysmal atrial fibrillation, complicated by tachybradycardia syndrome and is status post pacemaker implantation and takes coumadin.   Seen in HF clinic last week with worsening EF now 30-35%. Underwent R/L cath 10/7.  Severe 2V CAD. Was pending CABG today but held due to rising creatinine (likely overdiuresis in setting of decreased po intake in hospital). Given 61cc NS yesterday.  No further CP.   R arm continues to be sore at site of attempted radial artery cannulation (sheath never inserted as unable to feed wire). Hard to use hand due to pain. We did bedside u/s over weekend and it was negative.  Objective:   Weight Range:  Vital Signs:   Temp:  [97.7 F (36.5 C)-98 F (36.7 C)] 97.7 F (36.5 C) (10/12 0429) Pulse Rate:  [75-84] 78 (10/12 0429) Resp:  [17-18] 17 (10/12 0429) BP: (102-120)/(54-72) 116/71 mmHg (10/12 0429) SpO2:  [96 %-98 %] 97 % (10/12 0429) Weight:  [87.8 kg (193 lb 9 oz)] 87.8 kg (193 lb 9 oz) (10/12 0300) Last BM Date: 01/23/14  Weight change: Filed Weights   01/22/14 0701 01/23/14 0510 01/22/2014 0300  Weight: 93.1 kg (205 lb 4 oz) 93.9 kg (207 lb 0.2 oz) 87.8 kg (193 lb 9 oz)    Intake/Output:   Intake/Output Summary (Last 24 hours) at 01/30/2014 0944 Last data filed at 01/25/2014 0434  Gross per 24 hour  Intake    240 ml  Output   1250 ml  Net  -1010 ml     Physical Exam: General:  Elderly. Sitting up in bed No resp difficulty HEENT: normal Neck: supple. JVP 7 . Carotids 2+ bilat; no bruits. No lymphadenopathy or thryomegaly appreciated. Cor: PMI nondisplaced. Irregular rate & rhythm. No rubs, gallops or murmurs. Lungs: clear Abdomen: soft, nontender, nondistended. No hepatosplenomegaly. No bruits or masses. Good bowel sounds. Extremities: no cyanosis, clubbing, rash, edema  condom cath on  RUE bruising at  radial artery site. Tender to palpation. Good pulse. No bruit. Neurovasc intact in hand Neuro: alert & orientedx3, cranial nerves grossly intact. moves all 4 extremities w/o difficulty. Affect pleasant  Telemetry:  AF  Labs: Basic Metabolic Panel:  Recent Labs Lab 01/20/14 0358 01/21/14 1723 01/22/14 1203 01/23/14 1122 02/11/2014 0144  NA 140 137 135* 136* 138  K 4.0 4.1 4.0 4.2 4.0  CL 103 101 100 104 101  CO2 20 20 20 21 23   GLUCOSE 100* 109* 112* 102* 116*  BUN 19 24* 26* 28* 31*  CREATININE 1.43* 1.61* 1.75* 1.71* 1.96*  CALCIUM 8.9 9.3 9.0 8.9 9.1    Liver Function Tests:  Recent Labs Lab 01/22/14 1203  AST 19  ALT 11  ALKPHOS 54  BILITOT 0.6  PROT 6.7  ALBUMIN 3.2*   No results found for this basename: LIPASE, AMYLASE,  in the last 168 hours No results found for this basename: AMMONIA,  in the last 168 hours  CBC:  Recent Labs Lab 02/02/2014 1947 01/20/14 0358 01/21/14 0337 01/22/14 0225 01/23/14 0350  WBC 10.9* 8.9 8.5 10.8* 11.3*  HGB 14.2 13.7 14.0 13.1 12.4*  HCT 41.6 41.1 41.3 39.0 36.5*  MCV 96.1 95.8 95.6 95.8 95.8  PLT 211 228 204 215 203    Cardiac Enzymes: No results found for this basename: CKTOTAL, CKMB, CKMBINDEX, TROPONINI,  in the last 168 hours  BNP: BNP (last  3 results)  Recent Labs  01/06/14 1305  PROBNP 2171.0*     Other results:    Imaging: Dg Chest 2 View  01/22/2014   CLINICAL DATA:  Coronary artery disease  EXAM: CHEST  2 VIEW  COMPARISON:  01/06/2014  FINDINGS: Cardiomegaly again noted. Dual lead cardiac pacemaker is unchanged in position. Mild basilar atelectasis. No acute infiltrate or pulmonary edema. Thoracic spine osteopenia.  IMPRESSION: No active disease. Mild basilar atelectasis. Cardiomegaly again noted.   Electronically Signed   By: Lahoma Crocker M.D.   On: 01/22/2014 16:27     Medications:     Scheduled Medications: . aminocaproic acid (AMICAR) for OHS   Intravenous To OR  . [facility] HOLD] amiodarone   400 mg Oral BID  . [facility] HOLD] aspirin EC  81 mg Oral Daily  . [facility] HOLD] atorvastatin  40 mg Oral q1800  . [facility] HOLD] carvedilol  6.25 mg Oral BID WC  . cefUROXime (ZINACEF)  IV  1.5 g Intravenous To OR  . cefUROXime (ZINACEF)  IV  750 mg Intravenous To OR  . dexmedetomidine  0.1-0.7 mcg/kg/hr Intravenous To OR  . DOPamine  2-20 mcg/kg/min Intravenous To OR  . epinephrine  0.5-20 mcg/min Intravenous To OR  . [facility] HOLD] furosemide  40 mg Intravenous Once  . heparin-papaverine-plasmalyte irrigation   Irrigation To OR  . heparin 30,000 units/NS 1000 mL solution for CELLSAVER   Other To OR  . [MAR HOLD] heparin  5,000 Units Subcutaneous 3 times per day  . insulin (NOVOLIN-R) infusion   Intravenous To OR  . [facility] HOLD] isosorbide mononitrate  30 mg Oral Daily  . magnesium sulfate  40 mEq Other To OR  . [MAR HOLD] multivitamin with minerals  1 tablet Oral Daily  . nitroGLYCERIN  2-200 mcg/min Intravenous To OR  . [MAR HOLD] pantoprazole  40 mg Oral Daily  . phenylephrine (NEO-SYNEPHRINE) Adult infusion  30-200 mcg/min Intravenous To OR  . potassium chloride  80 mEq Other To OR  . [facility] HOLD] tamsulosin  0.4 mg Oral QPC supper  . vancomycin  1,250 mg Intravenous To OR    Infusions:    PRN Medications: [MAR HOLD] sodium chloride, [MAR HOLD] acetaminophen, [MAR HOLD] ALPRAZolam, [MAR HOLD] nitroGLYCERIN, [MAR HOLD] ondansetron (ZOFRAN) IV, [MAR HOLD] oxyCODONE-acetaminophen, [MAR HOLD] traMADol   Assessment:   1. Coronary artery disease 2. A/c systolic HF 3, iCM EF 22-02% 4. [name] 5. Acute on CKD, stage III 6. R arm hematoma  Plan/Discussion:    Renal function worse today likely due to overdiuresis. (only got 1 dose toradol) Got 500 cc NS yesterday. Lasix stopped. Will give one more bolus. Defer CABG/Maze until kidneys improve. Renal u/s ordered.   Continue amio fo AF. Off heparin due to R arm hematoma.  Continues to have pain in R forearm. We ultrasounded R radial artery at bedside  over weekend. Good triphasic flow.  No pseudoaneurysm. No evidence of neurovascular compromise or compartment syndrome. Will ask vascular to see.  D/W Dr. Roxan Hockey.  Daniel Bensimhon,MD 9:44 AM

## 2014-01-24 NOTE — Telephone Encounter (Signed)
FYI

## 2014-01-24 NOTE — Progress Notes (Signed)
CARDIAC REHAB PHASE I   PRE:  Rate/Rhythm: 82 paced  BP:  Supine: 132/74  Sitting:   Standing:    SaO2: 99%RA  MODE:  Ambulation: 300 ft   POST:  Rate/Rhythm: 94  BP:  Supine:   Sitting: 124/60  Standing:    SaO2: 96%RA 0940-1005 Pt walked 300 ft on RA with rolling walker and asst x 1 with fairly steady gait. Tolerated well. To recliner after walk. Emotional support given since surgery canceled. Call bell in reach.   Graylon Good, RN BSN  02/11/2014 10:03 AM

## 2014-01-24 NOTE — Progress Notes (Signed)
Called by Rn re: creatinine  BMET    Component Value Date/Time   NA 138 01/22/2014 0144   K 4.0 01/27/2014 0144   CL 101 02/07/2014 0144   CO2 23 01/31/2014 0144   GLUCOSE 116* 01/17/2014 0144   BUN 31* 02/12/2014 0144   CREATININE 1.96* 01/25/2014 0144   CALCIUM 9.1 01/15/2014 0144   GFRNONAA 30* 02/05/2014 0144   GFRAA 35* 02/07/2014 0144   His creatinine has gone from 1.4 up to 1.96. C/w acute renal failure   This likely multifactorial with dye and NSAIDs  In my opinion we should hold off on CABG/ maze and have Nephrology see the patient. We can reschedule him once his renal function recovers

## 2014-01-25 ENCOUNTER — Ambulatory Visit: Payer: Commercial Managed Care - HMO | Admitting: Family

## 2014-01-25 ENCOUNTER — Ambulatory Visit: Payer: Commercial Managed Care - HMO | Admitting: Internal Medicine

## 2014-01-25 DIAGNOSIS — Z0289 Encounter for other administrative examinations: Secondary | ICD-10-CM

## 2014-01-25 LAB — BASIC METABOLIC PANEL
Anion gap: 14 (ref 5–15)
BUN: 26 mg/dL — ABNORMAL HIGH (ref 6–23)
CALCIUM: 9 mg/dL (ref 8.4–10.5)
CO2: 23 mEq/L (ref 19–32)
Chloride: 103 mEq/L (ref 96–112)
Creatinine, Ser: 1.54 mg/dL — ABNORMAL HIGH (ref 0.50–1.35)
GFR calc Af Amer: 47 mL/min — ABNORMAL LOW (ref >90)
GFR, EST NON AFRICAN AMERICAN: 41 mL/min — AB (ref >90)
Glucose, Bld: 133 mg/dL — ABNORMAL HIGH (ref 70–99)
Potassium: 3.7 mEq/L (ref 3.7–5.3)
Sodium: 140 mEq/L (ref 137–147)

## 2014-01-25 NOTE — Progress Notes (Addendum)
Advanced Heart Failure Rounding Note   Subjective:     Francisco Merritt is a 80 y.o. male with a history of obesity, hypertension, CAD, paroxysmal atrial fibrillation, complicated by tachybradycardia syndrome and is status post pacemaker implantation and takes coumadin.   Seen in HF clinic last week with worsening EF now 30-35%. Underwent R/L cath 10/7.  Severe 2V CAD. Was pending CABG on 10/12 but held due to rising creatinine (likely overdiuresis in setting of decreased po intake in hospital). Hydrated.  No further CP. Breathing ok. BMET pending. Weight at baseline. Cr back to baseline 1.5   R forearm pain is decreasing some. Continues to be sore at site of attempted radial artery cannulation (sheath never inserted as unable to feed wire). We did bedside u/s over weekend and it was negative.  Objective:   Weight Range:  Vital Signs:   Temp:  [98.2 F (36.8 C)-98.9 F (37.2 C)] 98.5 F (36.9 C) (10/13 0512) Pulse Rate:  [70-82] 70 (10/13 0512) Resp:  [16-18] 18 (10/13 0512) BP: (101-124)/(62-72) 124/72 mmHg (10/13 0512) SpO2:  [95 %-98 %] 97 % (10/13 0512) Weight:  [91.4 kg (201 lb 8 oz)] 91.4 kg (201 lb 8 oz) (10/13 0512) Last BM Date: 02/11/2014  Weight change: Filed Weights   01/23/14 0510 01/27/2014 0300 01/25/14 0512  Weight: 93.9 kg (207 lb 0.2 oz) 87.8 kg (193 lb 9 oz) 91.4 kg (201 lb 8 oz)    Intake/Output:   Intake/Output Summary (Last 24 hours) at 01/25/14 0838 Last data filed at 01/25/14 0300  Gross per 24 hour  Intake      0 ml  Output    500 ml  Net   -500 ml     Physical Exam: General:  Elderly. Sitting up in bed No resp difficulty HEENT: normal Neck: supple. JVP 7 . Carotids 2+ bilat; no bruits. No lymphadenopathy or thryomegaly appreciated. Cor: PMI nondisplaced. Irregular rate & rhythm. No rubs, gallops or murmurs. Lungs: clear Abdomen: soft, nontender, nondistended. No hepatosplenomegaly. No bruits or masses. Good bowel sounds. Extremities: no cyanosis,  clubbing, rash, edema  condom cath on  RUE bruising at radial artery site. Tender to palpation. Good pulse. No bruit. Neurovasc intact in hand Neuro: alert & orientedx3, cranial nerves grossly intact. moves all 4 extremities w/o difficulty. Affect pleasant  Telemetry:  AF  Labs: Basic Metabolic Panel:  Recent Labs Lab 01/20/14 0358 01/21/14 1723 01/22/14 1203 01/23/14 1122 01/13/2014 0144  NA 140 137 135* 136* 138  K 4.0 4.1 4.0 4.2 4.0  CL 103 101 100 104 101  CO2 20 20 20 21 23   GLUCOSE 100* 109* 112* 102* 116*  BUN 19 24* 26* 28* 31*  CREATININE 1.43* 1.61* 1.75* 1.71* 1.96*  CALCIUM 8.9 9.3 9.0 8.9 9.1    Liver Function Tests:  Recent Labs Lab 01/22/14 1203  AST 19  ALT 11  ALKPHOS 54  BILITOT 0.6  PROT 6.7  ALBUMIN 3.2*   No results found for this basename: LIPASE, AMYLASE,  in the last 168 hours No results found for this basename: AMMONIA,  in the last 168 hours  CBC:  Recent Labs Lab 02/09/2014 1947 01/20/14 0358 01/21/14 0337 01/22/14 0225 01/23/14 0350  WBC 10.9* 8.9 8.5 10.8* 11.3*  HGB 14.2 13.7 14.0 13.1 12.4*  HCT 41.6 41.1 41.3 39.0 36.5*  MCV 96.1 95.8 95.6 95.8 95.8  PLT 211 228 204 215 203    Cardiac Enzymes: No results found for this basename: CKTOTAL, CKMB, CKMBINDEX,  TROPONINI,  in the last 168 hours  BNP: BNP (last 3 results)  Recent Labs  01/06/14 1305  PROBNP 2171.0*     Other results:    Imaging: US Renal  02/05/2014   CLINICAL DATA:  Acute on chronic renal failure, hypertension  EXAM: RENAL/URINARY TRACT ULTRASOUND COMPLETE  COMPARISON:  None.  FINDINGS: Right Kidney:  Length: 11.5 cm. No hydronephrosis. 1.1 x 0.8 x 1 cm anechoic lower pole renal mass most consistent with a cyst. Increased renal cortical echogenicity with cortical thinning.  Left Kidney:  Length: 11.7 cm. Increased renal cortical echogenicity with cortical thinning.No mass or hydronephrosis visualized.  Bladder:  Appears normal for degree of bladder  distention.  IMPRESSION: 1. No obstructive uropathy. 2. Increased renal cortical echogenicity and cortical thinning as can be seen with medical renal disease.   Electronically Signed   By: Kathreen Devoid   On: 01/26/2014 19:34     Medications:     Scheduled Medications: . amiodarone  400 mg Oral BID  . aspirin EC  81 mg Oral Daily  . atorvastatin  40 mg Oral q1800  . carvedilol  6.25 mg Oral BID WC  . heparin  5,000 Units Subcutaneous 3 times per day  . isosorbide mononitrate  30 mg Oral Daily  . multivitamin with minerals  1 tablet Oral Daily  . pantoprazole  40 mg Oral Daily  . tamsulosin  0.4 mg Oral QPC supper    Infusions:    PRN Medications: [MAR HOLD] sodium chloride, [MAR HOLD] acetaminophen, [MAR HOLD] ALPRAZolam, [MAR HOLD] nitroGLYCERIN, [MAR HOLD] ondansetron (ZOFRAN) IV, [MAR HOLD] oxyCODONE-acetaminophen, [MAR HOLD] traMADol   Assessment:   1. Coronary artery disease 2. A/c systolic HF 3, iCM EF 27-51% 4. [name] 5. Acute on CKD, stage III 6. R arm hematoma  Plan/Discussion:    Renal function back to baseline. Holding lasix. Await TCTS input for timing of CABG. They feel he may have to wait until next Monday due to schedule. Will see if we can do it sooner.   Continue amio fo AF. Off heparin due to R arm hematoma.  Continues to have pain in R forearm. We ultrasounded R radial artery at bedside over weekend. Good triphasic flow.  No pseudoaneurysm. No evidence of neurovascular compromise or compartment syndrome. Pain mostly with rotation. Suspect mostly a joint issue   Benay Spice 8:38 AM

## 2014-01-25 NOTE — Progress Notes (Signed)
CARDIAC REHAB PHASE I   PRE:  Rate/Rhythm: 71 paced  BP:  Supine:   Sitting: 100/70  Standing:    SaO2: 97%RA  MODE:  Ambulation: 200 ft   POST:  Rate/Rhythm: 102  BP:  Supine: 128/80  Sitting:   Standing:    SaO2: 97%RA 1445-1505 Pt walked 200 ft on RA with rolling walker and asst x1. Walks bent over. Encouraged to try to stand upright. Had been up in chair most of day so assisted to bed. No c/o CP.   Graylon Good, RN BSN  01/25/2014 3:03 PM

## 2014-01-25 NOTE — Progress Notes (Addendum)
[address]       [place] [address]             [phone]    Subjective: Awaits surgery till creat improves. No chest pain/SOB. C/O right arm pain since cath   Objective: Vital signs in last 24 hours: Temp:  [98.2 F (36.8 C)-98.9 F (37.2 C)] 98.5 F (36.9 C) (10/13 0512) Pulse Rate:  [70-82] 70 (10/13 0512) Cardiac Rhythm:  [-] Ventricular paced;Atrial fibrillation (10/12 1952) Resp:  [16-18] 18 (10/13 0512) BP: (101-124)/(62-72) 124/72 mmHg (10/13 0512) SpO2:  [95 %-98 %] 97 % (10/13 0512) Weight:  [201 lb 8 oz (91.4 kg)] 201 lb 8 oz (91.4 kg) (10/13 0512)  Hemodynamic parameters for last 24 hours:    Intake/Output from previous day: 10/12 0701 - 10/13 0700 In: -  Out: 500 [Urine:500] Intake/Output this shift:    General appearance: alert, cooperative and no distress Heart: irregularly irregular rhythm Lungs: clear to auscultation bilaterally Abdomen: benign Extremities: no edema  Lab Results:  Recent Labs  01/23/14 0350  WBC 11.3*  HGB 12.4*  HCT 36.5*  PLT 203   BMET:  Recent Labs  01/23/14 1122 01/23/2014 0144  NA 136* 138  K 4.2 4.0  CL 104 101  CO2 21 23  GLUCOSE 102* 116*  BUN 28* 31*  CREATININE 1.71* 1.96*  CALCIUM 8.9 9.1    PT/INR: No results found for this basename: LABPROT, INR,  in the last 72 hours ABG    Component Value Date/Time   PHART 7.395 01/17/2014 1607   HCO3 21.7 02/11/2014 1607   TCO2 23 01/24/2014 1607   ACIDBASEDEF 3.0* 01/13/2014 1607   O2SAT 95.0 02/02/2014 1607   CBG (last 3)  No results found for this basename: GLUCAP,  in the last 72 hours  Meds Scheduled Meds: . amiodarone  400 mg Oral BID  . aspirin EC  81 mg Oral Daily  . atorvastatin  40 mg Oral q1800  . carvedilol  6.25 mg Oral BID WC  . heparin  5,000 Units Subcutaneous 3 times per day  . isosorbide mononitrate  30 mg Oral Daily  . multivitamin with minerals  1 tablet Oral Daily  . pantoprazole  40 mg Oral Daily  .  tamsulosin  0.4 mg Oral QPC supper   Continuous Infusions:  PRN Meds:.[MAR HOLD] sodium chloride, [MAR HOLD] acetaminophen, [MAR HOLD] ALPRAZolam, [MAR HOLD] nitroGLYCERIN, [MAR HOLD] ondansetron (ZOFRAN) IV, [MAR HOLD] oxyCODONE-acetaminophen, [MAR HOLD] traMADol  Xrays US Renal  01/13/2014   CLINICAL DATA:  Acute on chronic renal failure, hypertension  EXAM: RENAL/URINARY TRACT ULTRASOUND COMPLETE  COMPARISON:  None.  FINDINGS: Right Kidney:  Length: 11.5 cm. No hydronephrosis. 1.1 x 0.8 x 1 cm anechoic lower pole renal mass most consistent with a cyst. Increased renal cortical echogenicity with cortical thinning.  Left Kidney:  Length: 11.7 cm. Increased renal cortical echogenicity with cortical thinning.No mass or hydronephrosis visualized.  Bladder:  Appears normal for degree of bladder distention.  IMPRESSION: 1. No obstructive uropathy. 2. Increased renal cortical echogenicity and cortical thinning as can be seen with medical renal disease.   Electronically Signed   By: Kathreen Devoid   On: 02/09/2014 19:34    Assessment/Plan: 1 awaits resolution of renal issues. Management as outlined per cardiology.    LOS: 6 days    GOLD,WAYNE E 01/25/2014  Renal US unremarkable No labs this AM Still c/o pain right wrist  Will recheck BMET, if  creatinine still rising will likely need a Nephrology consult Looking at schedule it is likely next Monday is earliest we will be able to get him back on schedule. If renal issues resolve amy be able to go home and come back as outpatient

## 2014-01-26 LAB — BASIC METABOLIC PANEL
Anion gap: 13 (ref 5–15)
BUN: 25 mg/dL — AB (ref 6–23)
CO2: 22 mEq/L (ref 19–32)
Calcium: 9.1 mg/dL (ref 8.4–10.5)
Chloride: 106 mEq/L (ref 96–112)
Creatinine, Ser: 1.55 mg/dL — ABNORMAL HIGH (ref 0.50–1.35)
GFR calc Af Amer: 47 mL/min — ABNORMAL LOW (ref >90)
GFR calc non Af Amer: 40 mL/min — ABNORMAL LOW (ref >90)
GLUCOSE: 99 mg/dL (ref 70–99)
Potassium: 4.1 mEq/L (ref 3.7–5.3)
Sodium: 141 mEq/L (ref 137–147)

## 2014-01-26 MED ORDER — TEMAZEPAM 15 MG PO CAPS: 15.0000 mg | ORAL_CAPSULE | Freq: Once | ORAL | Status: AC | PRN

## 2014-01-26 MED ORDER — ALPRAZOLAM 0.25 MG PO TABS: 0.2500 mg | ORAL_TABLET | ORAL | Status: DC | PRN

## 2014-01-26 MED ORDER — PAPAVERINE HCL 30 MG/ML IJ SOLN
INTRAMUSCULAR | Status: AC
  Administered 2014-01-27: 09:00:00
  Filled 2014-01-26: qty 2.5

## 2014-01-26 MED ORDER — METOPROLOL TARTRATE 12.5 MG HALF TABLET
12.5000 mg | ORAL_TABLET | Freq: Once | ORAL | Status: AC
  Administered 2014-01-27: 12.5 mg via ORAL
  Filled 2014-01-26: qty 1

## 2014-01-26 MED ORDER — DEXTROSE 5 % IV SOLN
750.0000 mg | INTRAVENOUS | Status: DC
  Filled 2014-01-26 (×2): qty 750

## 2014-01-26 MED ORDER — SODIUM CHLORIDE 0.9 % IV SOLN
INTRAVENOUS | Status: AC
  Administered 2014-01-27: 1 [IU]/h via INTRAVENOUS
  Administered 2014-01-27: 1.9 [IU]/h via INTRAVENOUS
  Filled 2014-01-26: qty 2.5

## 2014-01-26 MED ORDER — DIAZEPAM 2 MG PO TABS
2.0000 mg | ORAL_TABLET | Freq: Once | ORAL | Status: AC
Start: 2014-01-27 — End: 2014-01-27
  Administered 2014-01-27: 2 mg via ORAL
  Filled 2014-01-26: qty 1

## 2014-01-26 MED ORDER — ACETAMINOPHEN 325 MG PO TABS: 650.0000 mg | ORAL_TABLET | ORAL | Status: DC | PRN

## 2014-01-26 MED ORDER — SODIUM CHLORIDE 0.9 % IV SOLN: INTRAVENOUS | Status: DC | PRN

## 2014-01-26 MED ORDER — CHLORHEXIDINE GLUCONATE CLOTH 2 % EX PADS
6.0000 | MEDICATED_PAD | Freq: Once | CUTANEOUS | Status: AC
  Administered 2014-01-27: 6 via TOPICAL

## 2014-01-26 MED ORDER — MAGNESIUM SULFATE 50 % IJ SOLN
40.0000 meq | INTRAMUSCULAR | Status: DC
  Filled 2014-01-26: qty 10

## 2014-01-26 MED ORDER — VANCOMYCIN HCL 10 G IV SOLR
1250.0000 mg | INTRAVENOUS | Status: AC
  Administered 2014-01-27: 1250 mg via INTRAVENOUS
  Filled 2014-01-26: qty 1250

## 2014-01-26 MED ORDER — SODIUM CHLORIDE 0.9 % IV SOLN
INTRAVENOUS | Status: DC
  Filled 2014-01-26: qty 30

## 2014-01-26 MED ORDER — EPINEPHRINE HCL 1 MG/ML IJ SOLN
0.5000 ug/min | INTRAVENOUS | Status: DC
  Filled 2014-01-26: qty 4

## 2014-01-26 MED ORDER — OXYCODONE-ACETAMINOPHEN 5-325 MG PO TABS: 1.0000 | ORAL_TABLET | ORAL | Status: DC | PRN

## 2014-01-26 MED ORDER — DEXTROSE 5 % IV SOLN
1.5000 g | INTRAVENOUS | Status: AC
  Administered 2014-01-27: .75 g via INTRAVENOUS
  Administered 2014-01-27: 1.5 g via INTRAVENOUS
  Filled 2014-01-26: qty 1.5

## 2014-01-26 MED ORDER — POTASSIUM CHLORIDE 2 MEQ/ML IV SOLN
80.0000 meq | INTRAVENOUS | Status: DC
  Filled 2014-01-26: qty 40

## 2014-01-26 MED ORDER — ONDANSETRON HCL 4 MG/2ML IJ SOLN: 4.0000 mg | Freq: Four times a day (QID) | INTRAMUSCULAR | Status: DC | PRN

## 2014-01-26 MED ORDER — NITROGLYCERIN 0.4 MG SL SUBL: 0.4000 mg | SUBLINGUAL_TABLET | SUBLINGUAL | Status: DC | PRN

## 2014-01-26 MED ORDER — SODIUM CHLORIDE 0.9 % IV SOLN
INTRAVENOUS | Status: AC
  Administered 2014-01-27: 14 mL/h via INTRAVENOUS
  Administered 2014-01-27: 70 mL/h via INTRAVENOUS
  Filled 2014-01-26: qty 40

## 2014-01-26 MED ORDER — DEXMEDETOMIDINE HCL IN NACL 400 MCG/100ML IV SOLN
0.1000 ug/kg/h | INTRAVENOUS | Status: AC
  Administered 2014-01-27: 0.2 ug/kg/h via INTRAVENOUS
  Filled 2014-01-26: qty 100

## 2014-01-26 MED ORDER — TRAMADOL HCL 50 MG PO TABS
50.0000 mg | ORAL_TABLET | Freq: Four times a day (QID) | ORAL | Status: DC | PRN
Start: 2014-01-26 — End: 2014-01-27

## 2014-01-26 MED ORDER — NITROGLYCERIN IN D5W 200-5 MCG/ML-% IV SOLN
2.0000 ug/min | INTRAVENOUS | Status: DC
  Filled 2014-01-26: qty 250

## 2014-01-26 MED ORDER — CHLORHEXIDINE GLUCONATE CLOTH 2 % EX PADS
6.0000 | MEDICATED_PAD | Freq: Once | CUTANEOUS | Status: AC
  Administered 2014-01-26: 6 via TOPICAL

## 2014-01-26 MED ORDER — BISACODYL 5 MG PO TBEC
5.0000 mg | DELAYED_RELEASE_TABLET | Freq: Once | ORAL | Status: AC
Start: 2014-01-26 — End: 2014-01-26
  Administered 2014-01-26: 5 mg via ORAL
  Filled 2014-01-26: qty 1

## 2014-01-26 MED ORDER — PHENYLEPHRINE HCL 10 MG/ML IJ SOLN
30.0000 ug/min | INTRAVENOUS | Status: DC
  Filled 2014-01-26: qty 2

## 2014-01-26 MED ORDER — DOPAMINE-DEXTROSE 3.2-5 MG/ML-% IV SOLN
0.0000 ug/kg/min | INTRAVENOUS | Status: AC
  Administered 2014-01-27: 3 ug/kg/min via INTRAVENOUS
  Filled 2014-01-26: qty 250

## 2014-01-26 NOTE — Progress Notes (Signed)
Advanced Heart Failure Rounding Note   Subjective:     Francisco Merritt is a 80 y.o. male with a history of obesity, hypertension, CAD, paroxysmal atrial fibrillation, complicated by tachybradycardia syndrome and is status post pacemaker implantation and takes coumadin.   Seen in HF clinic last week with worsening EF now 30-35%. Underwent R/L cath 10/7.  Severe 2V CAD. Was pending CABG on 10/12 but held due to rising creatinine (likely overdiuresis in setting of decreased po intake in hospital). Hydrated.  Feeling stronger today. Cr back to baseline 1.5. No dyspnea. R wrist still sore but improving   Objective:   Weight Range:  Vital Signs:   Temp:  [97.2 F (36.2 C)-98 F (36.7 C)] 97.8 F (36.6 C) (10/14 0756) Pulse Rate:  [70-88] 78 (10/14 0756) Resp:  [17-18] 18 (10/14 0756) BP: (103-140)/(52-82) 121/65 mmHg (10/14 0756) SpO2:  [96 %-99 %] 99 % (10/14 0756) Weight:  [92.897 kg (204 lb 12.8 oz)] 92.897 kg (204 lb 12.8 oz) (10/14 0500) Last BM Date: 01/17/2014  Weight change: Filed Weights   01/16/2014 0300 01/25/14 0512 01/26/14 0500  Weight: 87.8 kg (193 lb 9 oz) 91.4 kg (201 lb 8 oz) 92.897 kg (204 lb 12.8 oz)    Intake/Output:   Intake/Output Summary (Last 24 hours) at 01/26/14 0800 Last data filed at 01/26/14 0500  Gross per 24 hour  Intake    600 ml  Output   1226 ml  Net   -626 ml     Physical Exam: General:  Elderly. Sitting up in bed No resp difficulty HEENT: normal Neck: supple. JVP 7-8 . Carotids 2+ bilat; no bruits. No lymphadenopathy or thryomegaly appreciated. Cor: PMI nondisplaced. Irregular rate & rhythm. No rubs, gallops or murmurs. Lungs: clear Abdomen: soft, nontender, nondistended. No hepatosplenomegaly. No bruits or masses. Good bowel sounds. Extremities: no cyanosis, clubbing, rash, edema  condom cath on  RUE bruising at radial artery site. Tender to palpation. Good pulse. No bruit. Neurovasc intact in hand Neuro: alert & orientedx3, cranial nerves  grossly intact. moves all 4 extremities w/o difficulty. Affect pleasant  Telemetry:  AF  Labs: Basic Metabolic Panel:  Recent Labs Lab 01/22/14 1203 01/23/14 1122 01/21/2014 0144 01/25/14 0910 01/26/14 0436  NA 135* 136* 138 140 141  K 4.0 4.2 4.0 3.7 4.1  CL 100 104 101 103 106  CO2 20 21 23 23 22   GLUCOSE 112* 102* 116* 133* 99  BUN 26* 28* 31* 26* 25*  CREATININE 1.75* 1.71* 1.96* 1.54* 1.55*  CALCIUM 9.0 8.9 9.1 9.0 9.1    Liver Function Tests:  Recent Labs Lab 01/22/14 1203  AST 19  ALT 11  ALKPHOS 54  BILITOT 0.6  PROT 6.7  ALBUMIN 3.2*   No results found for this basename: LIPASE, AMYLASE,  in the last 168 hours No results found for this basename: AMMONIA,  in the last 168 hours  CBC:  Recent Labs Lab 02/12/2014 1947 01/20/14 0358 01/21/14 0337 01/22/14 0225 01/23/14 0350  WBC 10.9* 8.9 8.5 10.8* 11.3*  HGB 14.2 13.7 14.0 13.1 12.4*  HCT 41.6 41.1 41.3 39.0 36.5*  MCV 96.1 95.8 95.6 95.8 95.8  PLT 211 228 204 215 203    Cardiac Enzymes: No results found for this basename: CKTOTAL, CKMB, CKMBINDEX, TROPONINI,  in the last 168 hours  BNP: BNP (last 3 results)  Recent Labs  01/06/14 1305  PROBNP 2171.0*     Other results:    Imaging: US Renal  01/17/2014  CLINICAL DATA:  Acute on chronic renal failure, hypertension  EXAM: RENAL/URINARY TRACT ULTRASOUND COMPLETE  COMPARISON:  None.  FINDINGS: Right Kidney:  Length: 11.5 cm. No hydronephrosis. 1.1 x 0.8 x 1 cm anechoic lower pole renal mass most consistent with a cyst. Increased renal cortical echogenicity with cortical thinning.  Left Kidney:  Length: 11.7 cm. Increased renal cortical echogenicity with cortical thinning.No mass or hydronephrosis visualized.  Bladder:  Appears normal for degree of bladder distention.  IMPRESSION: 1. No obstructive uropathy. 2. Increased renal cortical echogenicity and cortical thinning as can be seen with medical renal disease.   Electronically Signed   By:  Kathreen Devoid   On: 01/17/2014 19:34     Medications:     Scheduled Medications: . amiodarone  400 mg Oral BID  . aspirin EC  81 mg Oral Daily  . atorvastatin  40 mg Oral q1800  . carvedilol  6.25 mg Oral BID WC  . heparin  5,000 Units Subcutaneous 3 times per day  . isosorbide mononitrate  30 mg Oral Daily  . multivitamin with minerals  1 tablet Oral Daily  . pantoprazole  40 mg Oral Daily  . tamsulosin  0.4 mg Oral QPC supper    Infusions:    PRN Medications: [MAR HOLD] sodium chloride, [MAR HOLD] acetaminophen, [MAR HOLD] ALPRAZolam, [MAR HOLD] nitroGLYCERIN, [MAR HOLD] ondansetron (ZOFRAN) IV, [MAR HOLD] oxyCODONE-acetaminophen, [MAR HOLD] traMADol   Assessment:   1. Coronary artery disease 2. A/c systolic HF 3, iCM EF 16-10% 4. [name] 5. Acute on CKD, stage III 6. R arm hematoma  Plan/Discussion:    Renal function back to baseline. Holding lasix. CABG possibly tomorrow am.  Continue amio fo AF. Off systemic heparin due to R arm hematoma. (on DVT dose)  R wrist pain improving  Zaia Carre,MD 8:00 AM

## 2014-01-26 NOTE — Progress Notes (Addendum)
[address]       [place] [address]             [phone]      2 Days Post-Op Procedure(s) (LRB): CORONARY ARTERY BYPASS GRAFTING (CABG) (N/A) MAZE (N/A) TRANSESOPHAGEAL ECHOCARDIOGRAM (TEE) (N/A) Subjective: Feels ok, had a brief episode of left ant chest pain< 30 seconds.  Objective: Vital signs in last 24 hours: Temp:  [97.2 F (36.2 C)-98 F (36.7 C)] 97.8 F (36.6 C) (10/14 0756) Pulse Rate:  [70-88] 78 (10/14 0756) Cardiac Rhythm:  [-] Ventricular paced;Atrial fibrillation (10/13 2000) Resp:  [17-18] 18 (10/14 0756) BP: (103-140)/(52-82) 121/65 mmHg (10/14 0756) SpO2:  [96 %-99 %] 99 % (10/14 0756) Weight:  [204 lb 12.8 oz (92.897 kg)] 204 lb 12.8 oz (92.897 kg) (10/14 0500)  Hemodynamic parameters for last 24 hours:    Intake/Output from previous day: 10/13 0701 - 10/14 0700 In: 840 [P.O.:840] Out: 1226 [Urine:1225; Stool:1] Intake/Output this shift:    General appearance: alert, cooperative and no distress Heart: irregularly irregular rhythm Lungs: clear to auscultation bilaterally Abdomen: benign Extremities: no edema  Lab Results: No results found for this basename: WBC, HGB, HCT, PLT,  in the last 72 hours BMET:  Recent Labs  01/25/14 0910 01/26/14 0436  NA 140 141  K 3.7 4.1  CL 103 106  CO2 23 22  GLUCOSE 133* 99  BUN 26* 25*  CREATININE 1.54* 1.55*  CALCIUM 9.0 9.1    PT/INR: No results found for this basename: LABPROT, INR,  in the last 72 hours ABG    Component Value Date/Time   PHART 7.395 01/22/2014 1607   HCO3 21.7 02/12/2014 1607   TCO2 23 02/03/2014 1607   ACIDBASEDEF 3.0* 01/16/2014 1607   O2SAT 95.0 02/09/2014 1607   CBG (last 3)  No results found for this basename: GLUCAP,  in the last 72 hours  Meds Scheduled Meds: . amiodarone  400 mg Oral BID  . aspirin EC  81 mg Oral Daily  . atorvastatin  40 mg Oral q1800  . carvedilol  6.25 mg Oral BID WC  . heparin  5,000 Units Subcutaneous 3 times per  day  . isosorbide mononitrate  30 mg Oral Daily  . multivitamin with minerals  1 tablet Oral Daily  . pantoprazole  40 mg Oral Daily  . tamsulosin  0.4 mg Oral QPC supper   Continuous Infusions:  PRN Meds:.[MAR HOLD] sodium chloride, [MAR HOLD] acetaminophen, [MAR HOLD] ALPRAZolam, [MAR HOLD] nitroGLYCERIN, [MAR HOLD] ondansetron (ZOFRAN) IV, [MAR HOLD] oxyCODONE-acetaminophen, [MAR HOLD] traMADol  Xrays US Renal  02/02/2014   CLINICAL DATA:  Acute on chronic renal failure, hypertension  EXAM: RENAL/URINARY TRACT ULTRASOUND COMPLETE  COMPARISON:  None.  FINDINGS: Right Kidney:  Length: 11.5 cm. No hydronephrosis. 1.1 x 0.8 x 1 cm anechoic lower pole renal mass most consistent with a cyst. Increased renal cortical echogenicity with cortical thinning.  Left Kidney:  Length: 11.7 cm. Increased renal cortical echogenicity with cortical thinning.No mass or hydronephrosis visualized.  Bladder:  Appears normal for degree of bladder distention.  IMPRESSION: 1. No obstructive uropathy. 2. Increased renal cortical echogenicity and cortical thinning as can be seen with medical renal disease.   Electronically Signed   By: Kathreen Devoid   On: 01/13/2014 19:34    Assessment/Plan: S/P Procedure(s) (LRB): CORONARY ARTERY BYPASS GRAFTING (CABG) (N/A) MAZE (N/A) TRANSESOPHAGEAL ECHOCARDIOGRAM (TEE) (N/A)  1 stable, he does not typically have chest pain and episode  may not be angina. (was very brief) 2 creat stable 3 plans as schedule will allow re: surgery    LOS: 7 days    GOLD,WAYNE E 01/26/2014  Had transient CP this AM, resolved spontaneously Creatinine stable at 1.55- still high risk for perioperative renal issues We were able to secure a 4th cardiac room for tomorrow so will proceed with CABG/ maze He is aware of the indications, risks and benefits

## 2014-01-26 NOTE — Research (Signed)
LEVO-CTS Informed Consent   Subject Name: Francisco Merritt [facility]  Subject met inclusion and exclusion criteria.  The informed consent form, study requirements and expectations were reviewed with the subject and questions and concerns were addressed prior to the signing of the consent form.  The subject verbalized understanding of the trial requirements.  The subject agreed to participate in the LEVO-CTS trial and signed the informed consent.  The informed consent was obtained prior to performance of any protocol-specific procedures for the subject.  A copy of the signed informed consent was given to the subject and a copy was placed in the subject's medical record.  Berneda Rose 01/26/2014, 6:34 PM

## 2014-01-26 NOTE — Progress Notes (Signed)
0930 Noted pt for surgery tomorrow. We will follow up after surgery. Graylon Good RN BSN 01/26/2014 9:33 AM

## 2014-01-27 ENCOUNTER — Inpatient Hospital Stay (HOSPITAL_COMMUNITY): Payer: Medicare HMO

## 2014-01-27 ENCOUNTER — Inpatient Hospital Stay (HOSPITAL_COMMUNITY): Payer: Medicare HMO | Admitting: Certified Registered"

## 2014-01-27 ENCOUNTER — Encounter (HOSPITAL_COMMUNITY): Payer: Medicare HMO | Admitting: Certified Registered"

## 2014-01-27 ENCOUNTER — Encounter (HOSPITAL_COMMUNITY): Payer: Self-pay | Admitting: Certified Registered"

## 2014-01-27 ENCOUNTER — Encounter (HOSPITAL_COMMUNITY)
Admission: RE | Disposition: E | Payer: Commercial Managed Care - HMO | Source: Ambulatory Visit | Attending: Thoracic Surgery (Cardiothoracic Vascular Surgery)

## 2014-01-27 DIAGNOSIS — I2510 Atherosclerotic heart disease of native coronary artery without angina pectoris: Secondary | ICD-10-CM

## 2014-01-27 DIAGNOSIS — Z951 Presence of aortocoronary bypass graft: Secondary | ICD-10-CM

## 2014-01-27 HISTORY — PX: CORONARY ARTERY BYPASS GRAFT: SHX141

## 2014-01-27 HISTORY — PX: MAZE: SHX5063

## 2014-01-27 HISTORY — PX: INTRAOPERATIVE TRANSESOPHAGEAL ECHOCARDIOGRAM: SHX5062

## 2014-01-27 LAB — POCT I-STAT, CHEM 8
BUN: 21 mg/dL (ref 6–23)
BUN: 21 mg/dL (ref 6–23)
BUN: 20 mg/dL (ref 6–23)
BUN: 19 mg/dL (ref 6–23)
BUN: 19 mg/dL (ref 6–23)
BUN: 19 mg/dL (ref 6–23)
BUN: 19 mg/dL (ref 6–23)
BUN: 16 mg/dL (ref 6–23)
CALCIUM ION: 1.1 mmol/L — AB (ref 1.13–1.30)
CALCIUM ION: 1 mmol/L — AB (ref 1.13–1.30)
CALCIUM ION: 1.02 mmol/L — AB (ref 1.13–1.30)
CALCIUM ION: 1.22 mmol/L (ref 1.13–1.30)
CHLORIDE: 108 meq/L (ref 96–112)
CHLORIDE: 104 meq/L (ref 96–112)
CHLORIDE: 104 meq/L (ref 96–112)
CHLORIDE: 104 meq/L (ref 96–112)
CHLORIDE: 107 meq/L (ref 96–112)
CREATININE: 1.2 mg/dL (ref 0.50–1.35)
CREATININE: 1 mg/dL (ref 0.50–1.35)
CREATININE: 1.1 mg/dL (ref 0.50–1.35)
Calcium, Ion: 1.23 mmol/L (ref 1.13–1.30)
Calcium, Ion: 1.28 mmol/L (ref 1.13–1.30)
Calcium, Ion: 1.1 mmol/L — ABNORMAL LOW (ref 1.13–1.30)
Calcium, Ion: 1 mmol/L — ABNORMAL LOW (ref 1.13–1.30)
Chloride: 108 mEq/L (ref 96–112)
Chloride: 107 mEq/L (ref 96–112)
Chloride: 104 mEq/L (ref 96–112)
Creatinine, Ser: 1.1 mg/dL (ref 0.50–1.35)
Creatinine, Ser: 1.1 mg/dL (ref 0.50–1.35)
Creatinine, Ser: 1 mg/dL (ref 0.50–1.35)
Creatinine, Ser: 1 mg/dL (ref 0.50–1.35)
Creatinine, Ser: 1.1 mg/dL (ref 0.50–1.35)
GLUCOSE: 165 mg/dL — AB (ref 70–99)
Glucose, Bld: 123 mg/dL — ABNORMAL HIGH (ref 70–99)
Glucose, Bld: 147 mg/dL — ABNORMAL HIGH (ref 70–99)
Glucose, Bld: 140 mg/dL — ABNORMAL HIGH (ref 70–99)
Glucose, Bld: 161 mg/dL — ABNORMAL HIGH (ref 70–99)
Glucose, Bld: 126 mg/dL — ABNORMAL HIGH (ref 70–99)
Glucose, Bld: 142 mg/dL — ABNORMAL HIGH (ref 70–99)
Glucose, Bld: 127 mg/dL — ABNORMAL HIGH (ref 70–99)
HCT: 26 % — ABNORMAL LOW (ref 39.0–52.0)
HCT: 20 % — ABNORMAL LOW (ref 39.0–52.0)
HCT: 24 % — ABNORMAL LOW (ref 39.0–52.0)
HCT: 26 % — ABNORMAL LOW (ref 39.0–52.0)
HEMATOCRIT: 32 % — AB (ref 39.0–52.0)
HEMATOCRIT: 33 % — AB (ref 39.0–52.0)
HEMATOCRIT: 23 % — AB (ref 39.0–52.0)
HEMATOCRIT: 25 % — AB (ref 39.0–52.0)
HEMOGLOBIN: 11.2 g/dL — AB (ref 13.0–17.0)
Hemoglobin: 10.9 g/dL — ABNORMAL LOW (ref 13.0–17.0)
Hemoglobin: 8.8 g/dL — ABNORMAL LOW (ref 13.0–17.0)
Hemoglobin: 6.8 g/dL — CL (ref 13.0–17.0)
Hemoglobin: 8.2 g/dL — ABNORMAL LOW (ref 13.0–17.0)
Hemoglobin: 7.8 g/dL — ABNORMAL LOW (ref 13.0–17.0)
Hemoglobin: 8.8 g/dL — ABNORMAL LOW (ref 13.0–17.0)
Hemoglobin: 8.5 g/dL — ABNORMAL LOW (ref 13.0–17.0)
POTASSIUM: 4.3 meq/L (ref 3.7–5.3)
POTASSIUM: 7.1 meq/L — AB (ref 3.7–5.3)
POTASSIUM: 4.4 meq/L (ref 3.7–5.3)
Potassium: 4 mEq/L (ref 3.7–5.3)
Potassium: 5.2 mEq/L (ref 3.7–5.3)
Potassium: 5.5 mEq/L — ABNORMAL HIGH (ref 3.7–5.3)
Potassium: 4.4 mEq/L (ref 3.7–5.3)
Potassium: 5.3 mEq/L (ref 3.7–5.3)
SODIUM: 138 meq/L (ref 137–147)
SODIUM: 134 meq/L — AB (ref 137–147)
SODIUM: 136 meq/L — AB (ref 137–147)
Sodium: 137 mEq/L (ref 137–147)
Sodium: 132 mEq/L — ABNORMAL LOW (ref 137–147)
Sodium: 134 mEq/L — ABNORMAL LOW (ref 137–147)
Sodium: 138 mEq/L (ref 137–147)
Sodium: 140 mEq/L (ref 137–147)
TCO2: 23 mmol/L (ref 0–100)
TCO2: 24 mmol/L (ref 0–100)
TCO2: 22 mmol/L (ref 0–100)
TCO2: 22 mmol/L (ref 0–100)
TCO2: 22 mmol/L (ref 0–100)
TCO2: 21 mmol/L (ref 0–100)
TCO2: 23 mmol/L (ref 0–100)
TCO2: 21 mmol/L (ref 0–100)

## 2014-01-27 LAB — POCT I-STAT 3, ART BLOOD GAS (G3+)
ACID-BASE DEFICIT: 6 mmol/L — AB (ref 0.0–2.0)
Acid-base deficit: 1 mmol/L (ref 0.0–2.0)
Acid-base deficit: 4 mmol/L — ABNORMAL HIGH (ref 0.0–2.0)
Bicarbonate: 24.7 mEq/L — ABNORMAL HIGH (ref 20.0–24.0)
Bicarbonate: 24.3 mEq/L — ABNORMAL HIGH (ref 20.0–24.0)
Bicarbonate: 19.7 mEq/L — ABNORMAL LOW (ref 20.0–24.0)
Bicarbonate: 21.7 mEq/L (ref 20.0–24.0)
O2 SAT: 95 %
O2 Saturation: 100 %
O2 Saturation: 100 %
O2 Saturation: 99 %
PCO2 ART: 32.1 mmHg — AB (ref 35.0–45.0)
PCO2 ART: 41.6 mmHg (ref 35.0–45.0)
PH ART: 7.48 — AB (ref 7.350–7.450)
PH ART: 7.374 (ref 7.350–7.450)
PH ART: 7.376 (ref 7.350–7.450)
PO2 ART: 362 mmHg — AB (ref 80.0–100.0)
Patient temperature: 37.1
Patient temperature: 35.8
TCO2: 26 mmol/L (ref 0–100)
TCO2: 26 mmol/L (ref 0–100)
TCO2: 21 mmol/L (ref 0–100)
TCO2: 23 mmol/L (ref 0–100)
pCO2 arterial: 37.3 mmHg (ref 35.0–45.0)
pCO2 arterial: 36.5 mmHg (ref 35.0–45.0)
pH, Arterial: 7.324 — ABNORMAL LOW (ref 7.350–7.450)
pO2, Arterial: 320 mmHg — ABNORMAL HIGH (ref 80.0–100.0)
pO2, Arterial: 76 mmHg — ABNORMAL LOW (ref 80.0–100.0)
pO2, Arterial: 119 mmHg — ABNORMAL HIGH (ref 80.0–100.0)

## 2014-01-27 LAB — PREPARE RBC (CROSSMATCH)

## 2014-01-27 LAB — POCT I-STAT 4, (NA,K, GLUC, HGB,HCT)
Glucose, Bld: 134 mg/dL — ABNORMAL HIGH (ref 70–99)
HCT: 28 % — ABNORMAL LOW (ref 39.0–52.0)
Hemoglobin: 9.5 g/dL — ABNORMAL LOW (ref 13.0–17.0)
Potassium: 4.1 mEq/L (ref 3.7–5.3)
Sodium: 138 mEq/L (ref 137–147)

## 2014-01-27 LAB — POCT I-STAT 3, VENOUS BLOOD GAS (G3P V)
Acid-base deficit: 2 mmol/L (ref 0.0–2.0)
Bicarbonate: 23.2 mEq/L (ref 20.0–24.0)
O2 Saturation: 77 %
PCO2 VEN: 33.8 mmHg — AB (ref 45.0–50.0)
PH VEN: 7.429 — AB (ref 7.250–7.300)
TCO2: 24 mmol/L (ref 0–100)
pO2, Ven: 33 mmHg (ref 30.0–45.0)

## 2014-01-27 LAB — COMPREHENSIVE METABOLIC PANEL
ALBUMIN: 3.4 g/dL — AB (ref 3.5–5.2)
ALT: 21 U/L (ref 0–53)
AST: 28 U/L (ref 0–37)
Alkaline Phosphatase: 64 U/L (ref 39–117)
Anion gap: 13 (ref 5–15)
BUN: 25 mg/dL — ABNORMAL HIGH (ref 6–23)
CALCIUM: 9.5 mg/dL (ref 8.4–10.5)
CO2: 22 meq/L (ref 19–32)
Chloride: 104 mEq/L (ref 96–112)
Creatinine, Ser: 1.52 mg/dL — ABNORMAL HIGH (ref 0.50–1.35)
GFR calc Af Amer: 48 mL/min — ABNORMAL LOW (ref >90)
GFR, EST NON AFRICAN AMERICAN: 41 mL/min — AB (ref >90)
Glucose, Bld: 101 mg/dL — ABNORMAL HIGH (ref 70–99)
Potassium: 4.4 mEq/L (ref 3.7–5.3)
Sodium: 139 mEq/L (ref 137–147)
Total Bilirubin: 0.8 mg/dL (ref 0.3–1.2)
Total Protein: 7.4 g/dL (ref 6.0–8.3)

## 2014-01-27 LAB — CBC
HCT: 34.8 % — ABNORMAL LOW (ref 39.0–52.0)
HCT: 30 % — ABNORMAL LOW (ref 39.0–52.0)
HCT: 20.6 % — ABNORMAL LOW (ref 39.0–52.0)
HCT: 22.6 % — ABNORMAL LOW (ref 39.0–52.0)
HEMATOCRIT: 25.4 % — AB (ref 39.0–52.0)
HEMOGLOBIN: 7.6 g/dL — AB (ref 13.0–17.0)
Hemoglobin: 11.6 g/dL — ABNORMAL LOW (ref 13.0–17.0)
Hemoglobin: 10.2 g/dL — ABNORMAL LOW (ref 13.0–17.0)
Hemoglobin: 7 g/dL — ABNORMAL LOW (ref 13.0–17.0)
Hemoglobin: 8.7 g/dL — ABNORMAL LOW (ref 13.0–17.0)
MCH: 32.5 pg (ref 26.0–34.0)
MCH: 32.8 pg (ref 26.0–34.0)
MCH: 31 pg (ref 26.0–34.0)
MCH: 31.5 pg (ref 26.0–34.0)
MCH: 30.6 pg (ref 26.0–34.0)
MCHC: 33.3 g/dL (ref 30.0–36.0)
MCHC: 34 g/dL (ref 30.0–36.0)
MCHC: 33.6 g/dL (ref 30.0–36.0)
MCHC: 34 g/dL (ref 30.0–36.0)
MCHC: 34.3 g/dL (ref 30.0–36.0)
MCV: 97.5 fL (ref 78.0–100.0)
MCV: 96.5 fL (ref 78.0–100.0)
MCV: 92.2 fL (ref 78.0–100.0)
MCV: 92.8 fL (ref 78.0–100.0)
MCV: 89.4 fL (ref 78.0–100.0)
PLATELETS: 241 10*3/uL (ref 150–400)
PLATELETS: 103 10*3/uL — AB (ref 150–400)
PLATELETS: 93 10*3/uL — AB (ref 150–400)
Platelets: 81 10*3/uL — ABNORMAL LOW (ref 150–400)
Platelets: 81 10*3/uL — ABNORMAL LOW (ref 150–400)
RBC: 3.57 MIL/uL — ABNORMAL LOW (ref 4.22–5.81)
RBC: 3.11 MIL/uL — AB (ref 4.22–5.81)
RBC: 2.22 MIL/uL — AB (ref 4.22–5.81)
RBC: 2.45 MIL/uL — AB (ref 4.22–5.81)
RBC: 2.84 MIL/uL — ABNORMAL LOW (ref 4.22–5.81)
RDW: 13.7 % (ref 11.5–15.5)
RDW: 15 % (ref 11.5–15.5)
RDW: 15 % (ref 11.5–15.5)
RDW: 15.3 % (ref 11.5–15.5)
RDW: 16.1 % — AB (ref 11.5–15.5)
WBC: 8.6 10*3/uL (ref 4.0–10.5)
WBC: 16.9 10*3/uL — AB (ref 4.0–10.5)
WBC: 12.8 10*3/uL — AB (ref 4.0–10.5)
WBC: 14.6 10*3/uL — AB (ref 4.0–10.5)
WBC: 11.3 10*3/uL — ABNORMAL HIGH (ref 4.0–10.5)

## 2014-01-27 LAB — HEMOGLOBIN AND HEMATOCRIT, BLOOD
HCT: 18.6 % — ABNORMAL LOW (ref 39.0–52.0)
Hemoglobin: 6.2 g/dL — CL (ref 13.0–17.0)

## 2014-01-27 LAB — CK TOTAL AND CKMB (NOT AT ARMC)
CK TOTAL: 52 U/L (ref 7–232)
CK, MB: 1.7 ng/mL (ref 0.3–4.0)
Relative Index: INVALID (ref 0.0–2.5)

## 2014-01-27 LAB — POCT I-STAT GLUCOSE
Glucose, Bld: 147 mg/dL — ABNORMAL HIGH (ref 70–99)
Glucose, Bld: 151 mg/dL — ABNORMAL HIGH (ref 70–99)
OPERATOR ID: 3390
OPERATOR ID: 3390

## 2014-01-27 LAB — CARBOXYHEMOGLOBIN
Carboxyhemoglobin: 1.5 % (ref 0.5–1.5)
Methemoglobin: 1 % (ref 0.0–1.5)
O2 SAT: 47.1 %
Total hemoglobin: 9 g/dL — ABNORMAL LOW (ref 13.5–18.0)

## 2014-01-27 LAB — PROTIME-INR
INR: 1.18 (ref 0.00–1.49)
INR: 1.74 — AB (ref 0.00–1.49)
PROTHROMBIN TIME: 15.1 s (ref 11.6–15.2)
PROTHROMBIN TIME: 20.5 s — AB (ref 11.6–15.2)

## 2014-01-27 LAB — GLUCOSE, CAPILLARY
GLUCOSE-CAPILLARY: 93 mg/dL (ref 70–99)
Glucose-Capillary: 113 mg/dL — ABNORMAL HIGH (ref 70–99)
Glucose-Capillary: 110 mg/dL — ABNORMAL HIGH (ref 70–99)

## 2014-01-27 LAB — CREATININE, SERUM
Creatinine, Ser: 1.12 mg/dL (ref 0.50–1.35)
GFR calc non Af Amer: 60 mL/min — ABNORMAL LOW (ref >90)
GFR, EST AFRICAN AMERICAN: 69 mL/min — AB (ref >90)

## 2014-01-27 LAB — PLATELET COUNT: Platelets: 98 10*3/uL — ABNORMAL LOW (ref 150–400)

## 2014-01-27 LAB — MAGNESIUM: MAGNESIUM: 3.2 mg/dL — AB (ref 1.5–2.5)

## 2014-01-27 LAB — TROPONIN I: Troponin I: <0.3 ng/mL (ref <0.30)

## 2014-01-27 LAB — PRO B NATRIURETIC PEPTIDE: PRO B NATRI PEPTIDE: 1919 pg/mL — AB (ref 0–450)

## 2014-01-27 LAB — APTT: aPTT: 32 seconds (ref 24–37)

## 2014-01-27 SURGERY — CORONARY ARTERY BYPASS GRAFTING (CABG)
Anesthesia: General | Site: Chest

## 2014-01-27 MED ORDER — ACETAMINOPHEN 160 MG/5ML PO SOLN: 1000.0000 mg | Freq: Four times a day (QID) | ORAL | Status: AC

## 2014-01-27 MED ORDER — DEXTROSE 5 % IV SOLN
0.2000 ug/kg/min | INTRAVENOUS | Status: DC
  Filled 2014-01-27: qty 25

## 2014-01-27 MED ORDER — VECURONIUM BROMIDE 10 MG IV SOLR
INTRAVENOUS | Status: AC
  Filled 2014-01-27: qty 20

## 2014-01-27 MED ORDER — LIDOCAINE HCL (CARDIAC) 20 MG/ML IV SOLN
INTRAVENOUS | Status: AC
  Filled 2014-01-27: qty 5

## 2014-01-27 MED ORDER — PHENYLEPHRINE HCL 10 MG/ML IJ SOLN
0.0000 ug/min | INTRAVENOUS | Status: DC
  Administered 2014-01-27: 60 ug/min via INTRAVENOUS
  Filled 2014-01-27 (×2): qty 2

## 2014-01-27 MED ORDER — EPHEDRINE SULFATE 50 MG/ML IJ SOLN
INTRAMUSCULAR | Status: AC
  Filled 2014-01-27: qty 1

## 2014-01-27 MED ORDER — ACETAMINOPHEN 160 MG/5ML PO SOLN: 650.0000 mg | Freq: Once | ORAL | Status: AC

## 2014-01-27 MED ORDER — LACTATED RINGERS IV SOLN
INTRAVENOUS | Status: DC | PRN
  Administered 2014-01-27 (×2): via INTRAVENOUS

## 2014-01-27 MED ORDER — HEPARIN SODIUM (PORCINE) 1000 UNIT/ML IJ SOLN
INTRAMUSCULAR | Status: DC | PRN
  Administered 2014-01-27: 35000 [IU] via INTRAVENOUS
  Administered 2014-01-27: 2000 [IU] via INTRAVENOUS

## 2014-01-27 MED ORDER — BISACODYL 5 MG PO TBEC
10.0000 mg | DELAYED_RELEASE_TABLET | Freq: Every day | ORAL | Status: DC
  Administered 2014-01-29 – 2014-02-04 (×4): 10 mg via ORAL
  Filled 2014-01-27 (×4): qty 2

## 2014-01-27 MED ORDER — NITROGLYCERIN IN D5W 200-5 MCG/ML-% IV SOLN
INTRAVENOUS | Status: DC | PRN
  Administered 2014-01-27: 16.67 ug/min via INTRAVENOUS

## 2014-01-27 MED ORDER — POTASSIUM CHLORIDE 10 MEQ/50ML IV SOLN: 10.0000 meq | INTRAVENOUS | Status: AC

## 2014-01-27 MED ORDER — SODIUM CHLORIDE 0.9 % IJ SOLN
INTRAMUSCULAR | Status: AC
  Filled 2014-01-27: qty 30

## 2014-01-27 MED ORDER — LACTATED RINGERS IV SOLN
500.0000 mL | Freq: Once | INTRAVENOUS | Status: AC | PRN
  Administered 2014-01-27: 18:00:00 via INTRAVENOUS

## 2014-01-27 MED ORDER — FENTANYL CITRATE 0.05 MG/ML IJ SOLN
INTRAMUSCULAR | Status: AC
  Filled 2014-01-27: qty 5

## 2014-01-27 MED ORDER — ONDANSETRON HCL 4 MG/2ML IJ SOLN
4.0000 mg | Freq: Four times a day (QID) | INTRAMUSCULAR | Status: DC | PRN
  Administered 2014-01-29: 4 mg via INTRAVENOUS
  Filled 2014-01-27: qty 2

## 2014-01-27 MED ORDER — MIDAZOLAM HCL 10 MG/2ML IJ SOLN
INTRAMUSCULAR | Status: AC
  Filled 2014-01-27: qty 2

## 2014-01-27 MED ORDER — SODIUM BICARBONATE 8.4 % IV SOLN
50.0000 meq | Freq: Once | INTRAVENOUS | Status: AC
  Administered 2014-01-27: 50 meq via INTRAVENOUS

## 2014-01-27 MED ORDER — PROPOFOL 10 MG/ML IV BOLUS
INTRAVENOUS | Status: AC
  Filled 2014-01-27: qty 20

## 2014-01-27 MED ORDER — PROTAMINE SULFATE 10 MG/ML IV SOLN
INTRAVENOUS | Status: DC | PRN
  Administered 2014-01-27: 30 mg via INTRAVENOUS
  Administered 2014-01-27: 50 mg via INTRAVENOUS
  Administered 2014-01-27: 20 mg via INTRAVENOUS
  Administered 2014-01-27 (×3): 50 mg via INTRAVENOUS
  Administered 2014-01-27 (×3): 20 mg via INTRAVENOUS
  Administered 2014-01-27: 50 mg via INTRAVENOUS

## 2014-01-27 MED ORDER — DEXTROSE 5 % IV SOLN
1.5000 g | Freq: Two times a day (BID) | INTRAVENOUS | Status: AC
  Administered 2014-01-27 – 2014-01-29 (×4): 1.5 g via INTRAVENOUS
  Filled 2014-01-27 (×4): qty 1.5

## 2014-01-27 MED ORDER — ACETAMINOPHEN 500 MG PO TABS
1000.0000 mg | ORAL_TABLET | Freq: Four times a day (QID) | ORAL | Status: AC
  Administered 2014-01-28 – 2014-02-01 (×15): 1000 mg via ORAL
  Filled 2014-01-27 (×18): qty 2

## 2014-01-27 MED ORDER — HEMOSTATIC AGENTS (NO CHARGE) OPTIME
TOPICAL | Status: DC | PRN
  Administered 2014-01-27 (×3): 1 via TOPICAL

## 2014-01-27 MED ORDER — ALBUMIN HUMAN 5 % IV SOLN
INTRAVENOUS | Status: DC | PRN
  Administered 2014-01-27 (×2): via INTRAVENOUS

## 2014-01-27 MED ORDER — SODIUM CHLORIDE 0.9 % IV SOLN
10.0000 mg | INTRAVENOUS | Status: DC | PRN
  Administered 2014-01-27: 50 ug/min via INTRAVENOUS

## 2014-01-27 MED ORDER — SODIUM CHLORIDE 0.9 % IV SOLN
INTRAVENOUS | Status: DC
  Administered 2014-01-27: 17:00:00 via INTRAVENOUS
  Administered 2014-01-28: 20 mL via INTRAVENOUS

## 2014-01-27 MED ORDER — FAMOTIDINE IN NACL 20-0.9 MG/50ML-% IV SOLN
20.0000 mg | Freq: Two times a day (BID) | INTRAVENOUS | Status: AC
  Administered 2014-01-27: 20 mg via INTRAVENOUS
  Filled 2014-01-27: qty 50

## 2014-01-27 MED ORDER — MILRINONE IN DEXTROSE 20 MG/100ML IV SOLN
0.3750 ug/kg/min | INTRAVENOUS | Status: DC
  Administered 2014-01-27 – 2014-02-04 (×13): 0.25 ug/kg/min via INTRAVENOUS
  Administered 2014-02-05: 0.375 ug/kg/min via INTRAVENOUS
  Administered 2014-02-05: 0.25 ug/kg/min via INTRAVENOUS
  Administered 2014-02-06 (×2): 0.375 ug/kg/min via INTRAVENOUS
  Filled 2014-01-27 (×18): qty 100

## 2014-01-27 MED ORDER — VECURONIUM BROMIDE 10 MG IV SOLR
INTRAVENOUS | Status: DC | PRN
  Administered 2014-01-27: 4 mg via INTRAVENOUS
  Administered 2014-01-27: 10 mg via INTRAVENOUS
  Administered 2014-01-27: 4 mg via INTRAVENOUS
  Administered 2014-01-27: 6 mg via INTRAVENOUS

## 2014-01-27 MED ORDER — MORPHINE SULFATE 2 MG/ML IJ SOLN
2.0000 mg | INTRAMUSCULAR | Status: DC | PRN
  Administered 2014-01-28 (×5): 2 mg via INTRAVENOUS
  Filled 2014-01-27 (×5): qty 1

## 2014-01-27 MED ORDER — SODIUM BICARBONATE 4.2 % IV SOLN
50.0000 meq | Freq: Once | INTRAVENOUS | Status: DC
  Filled 2014-01-27: qty 100

## 2014-01-27 MED ORDER — SODIUM CHLORIDE 0.9 % IJ SOLN
3.0000 mL | Freq: Two times a day (BID) | INTRAMUSCULAR | Status: DC
  Administered 2014-01-28 – 2014-01-31 (×7): 3 mL via INTRAVENOUS

## 2014-01-27 MED ORDER — OXYCODONE HCL 5 MG PO TABS
5.0000 mg | ORAL_TABLET | ORAL | Status: DC | PRN
  Administered 2014-02-04: 5 mg via ORAL
  Filled 2014-01-27: qty 1

## 2014-01-27 MED ORDER — CALCIUM CHLORIDE 10 % IV SOLN
1.0000 g | Freq: Once | INTRAVENOUS | Status: AC
  Administered 2014-01-27: 1 g via INTRAVENOUS

## 2014-01-27 MED ORDER — SODIUM CHLORIDE 0.9 % IV SOLN: Freq: Once | INTRAVENOUS | Status: DC

## 2014-01-27 MED ORDER — ACETAMINOPHEN 650 MG RE SUPP
650.0000 mg | Freq: Once | RECTAL | Status: AC
  Administered 2014-01-27: 650 mg via RECTAL

## 2014-01-27 MED ORDER — PROPOFOL 10 MG/ML IV BOLUS
INTRAVENOUS | Status: DC | PRN
  Administered 2014-01-27: 80 mg via INTRAVENOUS

## 2014-01-27 MED ORDER — METOPROLOL TARTRATE 1 MG/ML IV SOLN
2.5000 mg | INTRAVENOUS | Status: DC | PRN
  Administered 2014-02-04: 5 mg via INTRAVENOUS

## 2014-01-27 MED ORDER — ALBUMIN HUMAN 5 % IV SOLN
12.5000 g | INTRAVENOUS | Status: DC | PRN
  Administered 2014-01-27 – 2014-01-29 (×7): 12.5 g via INTRAVENOUS
  Filled 2014-01-27: qty 250
  Filled 2014-01-27: qty 500
  Filled 2014-01-27 (×3): qty 250

## 2014-01-27 MED ORDER — INSULIN REGULAR BOLUS VIA INFUSION
0.0000 [IU] | Freq: Three times a day (TID) | INTRAVENOUS | Status: DC
  Filled 2014-01-27: qty 10

## 2014-01-27 MED ORDER — NITROGLYCERIN IN D5W 200-5 MCG/ML-% IV SOLN: 0.0000 ug/min | INTRAVENOUS | Status: DC

## 2014-01-27 MED ORDER — ALBUMIN HUMAN 5 % IV SOLN
250.0000 mL | INTRAVENOUS | Status: AC | PRN
  Administered 2014-01-27 (×4): 250 mL via INTRAVENOUS
  Filled 2014-01-27 (×2): qty 250

## 2014-01-27 MED ORDER — VANCOMYCIN HCL IN DEXTROSE 1-5 GM/200ML-% IV SOLN
1000.0000 mg | Freq: Once | INTRAVENOUS | Status: AC
  Administered 2014-01-27: 1000 mg via INTRAVENOUS
  Filled 2014-01-27: qty 200

## 2014-01-27 MED ORDER — TRAMADOL HCL 50 MG PO TABS
50.0000 mg | ORAL_TABLET | ORAL | Status: DC | PRN
  Administered 2014-02-01 – 2014-02-02 (×3): 50 mg via ORAL
  Filled 2014-01-27 (×3): qty 1

## 2014-01-27 MED ORDER — DEXMEDETOMIDINE HCL IN NACL 400 MCG/100ML IV SOLN
0.4000 ug/kg/h | INTRAVENOUS | Status: DC
  Filled 2014-01-27: qty 100

## 2014-01-27 MED ORDER — ASPIRIN 81 MG PO CHEW
324.0000 mg | CHEWABLE_TABLET | Freq: Every day | ORAL | Status: DC
  Administered 2014-02-05 – 2014-02-06 (×2): 324 mg
  Filled 2014-01-27 (×2): qty 4

## 2014-01-27 MED ORDER — PANTOPRAZOLE SODIUM 40 MG PO TBEC
40.0000 mg | DELAYED_RELEASE_TABLET | Freq: Every day | ORAL | Status: DC
  Administered 2014-01-29 – 2014-02-05 (×8): 40 mg via ORAL
  Filled 2014-01-27 (×7): qty 1

## 2014-01-27 MED ORDER — PROTAMINE SULFATE 10 MG/ML IV SOLN
INTRAVENOUS | Status: AC
Start: 2014-01-27 — End: 2014-01-27
  Filled 2014-01-27: qty 10

## 2014-01-27 MED ORDER — BISACODYL 10 MG RE SUPP
10.0000 mg | Freq: Every day | RECTAL | Status: DC
  Administered 2014-02-05 – 2014-02-06 (×2): 10 mg via RECTAL
  Filled 2014-01-27 (×2): qty 1

## 2014-01-27 MED ORDER — CHLORHEXIDINE GLUCONATE 0.12 % MT SOLN
15.0000 mL | Freq: Once | OROMUCOSAL | Status: AC
  Administered 2014-01-27: 15 mL via OROMUCOSAL
  Filled 2014-01-27: qty 15

## 2014-01-27 MED ORDER — 0.9 % SODIUM CHLORIDE (POUR BTL) OPTIME
TOPICAL | Status: DC | PRN
  Administered 2014-01-27: 6000 mL

## 2014-01-27 MED ORDER — METOPROLOL TARTRATE 12.5 MG HALF TABLET
12.5000 mg | ORAL_TABLET | Freq: Two times a day (BID) | ORAL | Status: DC
  Filled 2014-01-27 (×9): qty 1

## 2014-01-27 MED ORDER — MAGNESIUM SULFATE 4000MG/100ML IJ SOLN
4.0000 g | Freq: Once | INTRAMUSCULAR | Status: AC
  Administered 2014-01-27: 4 g via INTRAVENOUS
  Filled 2014-01-27: qty 100

## 2014-01-27 MED ORDER — HEPARIN SODIUM (PORCINE) 1000 UNIT/ML IJ SOLN
INTRAMUSCULAR | Status: AC
  Filled 2014-01-27: qty 1

## 2014-01-27 MED ORDER — ASPIRIN EC 325 MG PO TBEC
325.0000 mg | DELAYED_RELEASE_TABLET | Freq: Every day | ORAL | Status: DC
  Administered 2014-01-29 – 2014-02-04 (×7): 325 mg via ORAL
  Filled 2014-01-27 (×10): qty 1

## 2014-01-27 MED ORDER — SODIUM CHLORIDE 0.9 % IV SOLN
250.0000 mL | INTRAVENOUS | Status: DC
  Administered 2014-02-04: 20 mL via INTRAVENOUS

## 2014-01-27 MED ORDER — SODIUM CHLORIDE 0.9 % IV SOLN
INTRAVENOUS | Status: DC
  Filled 2014-01-27 (×2): qty 2.5

## 2014-01-27 MED ORDER — SODIUM CHLORIDE 0.9 % IV SOLN
Freq: Once | INTRAVENOUS | Status: AC
  Administered 2014-01-27: 10 mL/h via INTRAVENOUS

## 2014-01-27 MED ORDER — ROCURONIUM BROMIDE 50 MG/5ML IV SOLN
INTRAVENOUS | Status: AC
  Filled 2014-01-27: qty 1

## 2014-01-27 MED ORDER — LACTATED RINGERS IV SOLN
INTRAVENOUS | Status: DC
  Administered 2014-01-27: 17:00:00 via INTRAVENOUS

## 2014-01-27 MED ORDER — DEXMEDETOMIDINE HCL IN NACL 200 MCG/50ML IV SOLN
0.0000 ug/kg/h | INTRAVENOUS | Status: DC
  Administered 2014-01-27: 0.3 ug/kg/h via INTRAVENOUS
  Filled 2014-01-27: qty 50

## 2014-01-27 MED ORDER — SODIUM CHLORIDE 0.45 % IV SOLN
INTRAVENOUS | Status: DC
  Administered 2014-01-27: 17:00:00 via INTRAVENOUS
  Administered 2014-01-30: 20 mL/h via INTRAVENOUS

## 2014-01-27 MED ORDER — SODIUM BICARBONATE 8.4 % IV SOLN
50.0000 meq | Freq: Once | INTRAVENOUS | Status: AC
  Administered 2014-01-27: 50 meq via INTRAVENOUS
  Filled 2014-01-27: qty 50

## 2014-01-27 MED ORDER — NOREPINEPHRINE BITARTRATE 1 MG/ML IV SOLN
0.0000 ug/min | INTRAVENOUS | Status: DC
  Administered 2014-01-27: 5 ug/min via INTRAVENOUS
  Filled 2014-01-27: qty 4

## 2014-01-27 MED ORDER — SODIUM CHLORIDE 0.9 % IJ SOLN
OROMUCOSAL | Status: DC | PRN
  Administered 2014-01-27 (×4): via TOPICAL

## 2014-01-27 MED ORDER — SODIUM CHLORIDE 0.9 % IJ SOLN: 3.0000 mL | INTRAMUSCULAR | Status: DC | PRN

## 2014-01-27 MED ORDER — SODIUM CHLORIDE 0.9 % IV SOLN
0.5000 g/h | INTRAVENOUS | Status: DC
  Filled 2014-01-27: qty 20

## 2014-01-27 MED ORDER — MIDAZOLAM HCL 5 MG/5ML IJ SOLN
INTRAMUSCULAR | Status: DC | PRN
  Administered 2014-01-27: 1 mg via INTRAVENOUS
  Administered 2014-01-27 (×2): 2 mg via INTRAVENOUS
  Administered 2014-01-27: 1 mg via INTRAVENOUS

## 2014-01-27 MED ORDER — LACTATED RINGERS IV SOLN
INTRAVENOUS | Status: DC | PRN
  Administered 2014-01-27: 07:00:00 via INTRAVENOUS

## 2014-01-27 MED ORDER — PHENYLEPHRINE HCL 10 MG/ML IJ SOLN
20.0000 mg | INTRAVENOUS | Status: DC | PRN
  Administered 2014-01-27: 10 ug/min via INTRAVENOUS

## 2014-01-27 MED ORDER — MIDAZOLAM HCL 2 MG/2ML IJ SOLN
2.0000 mg | INTRAMUSCULAR | Status: DC | PRN
  Filled 2014-01-27: qty 2

## 2014-01-27 MED ORDER — ALBUMIN HUMAN 5 % IV SOLN
12.5000 g | Freq: Once | INTRAVENOUS | Status: AC
  Administered 2014-01-27: 12.5 g via INTRAVENOUS

## 2014-01-27 MED ORDER — METOPROLOL TARTRATE 25 MG/10 ML ORAL SUSPENSION
12.5000 mg | Freq: Two times a day (BID) | ORAL | Status: DC
  Filled 2014-01-27 (×9): qty 5

## 2014-01-27 MED ORDER — PHENYLEPHRINE 40 MCG/ML (10ML) SYRINGE FOR IV PUSH (FOR BLOOD PRESSURE SUPPORT)
PREFILLED_SYRINGE | INTRAVENOUS | Status: AC
  Filled 2014-01-27: qty 10

## 2014-01-27 MED ORDER — MORPHINE SULFATE 2 MG/ML IJ SOLN
1.0000 mg | INTRAMUSCULAR | Status: AC | PRN
  Administered 2014-01-28: 2 mg via INTRAVENOUS
  Filled 2014-01-27: qty 1

## 2014-01-27 MED ORDER — STUDY - INVESTIGATIONAL DRUG SIMPLE RECORD
0.1000 ug/kg/min | Status: DC
  Filled 2014-01-27: qty 25

## 2014-01-27 MED ORDER — DOCUSATE SODIUM 100 MG PO CAPS
200.0000 mg | ORAL_CAPSULE | Freq: Every day | ORAL | Status: DC
  Administered 2014-01-29 – 2014-02-04 (×5): 200 mg via ORAL
  Filled 2014-01-27 (×5): qty 2

## 2014-01-27 MED ORDER — MILRINONE LOAD VIA INFUSION
50.0000 ug/kg | Freq: Once | INTRAVENOUS | Status: AC
  Administered 2014-01-27: 4200 ug via INTRAVENOUS
  Filled 2014-01-27: qty 4.2

## 2014-01-27 MED ORDER — FENTANYL CITRATE 0.05 MG/ML IJ SOLN
INTRAMUSCULAR | Status: DC | PRN
Start: 2014-01-27 — End: 2014-01-27
  Administered 2014-01-27: 25 ug via INTRAVENOUS
  Administered 2014-01-27: 225 ug via INTRAVENOUS
  Administered 2014-01-27 (×3): 250 ug via INTRAVENOUS

## 2014-01-27 SURGICAL SUPPLY — 128 items
ADAPTER CARDIO PERF ANTE/RETRO (ADAPTER) ×3 IMPLANT
ADH SKN CLS APL DERMABOND .7 (GAUZE/BANDAGES/DRESSINGS) ×2
ADPR PRFSN 84XANTGRD RTRGD (ADAPTER) ×2
ATRICLIP EXCLUSION 45 FLEX HDL (Clip) ×2 IMPLANT
ATTRACTOMAT 16X20 MAGNETIC DRP (DRAPES) ×4 IMPLANT
BAG DECANTER FOR FLEXI CONT (MISCELLANEOUS) ×4 IMPLANT
BANDAGE ELASTIC 4 VELCRO ST LF (GAUZE/BANDAGES/DRESSINGS) ×6 IMPLANT
BANDAGE ELASTIC 6 VELCRO ST LF (GAUZE/BANDAGES/DRESSINGS) ×6 IMPLANT
BASKET HEART  (ORDER IN 25'S) (MISCELLANEOUS) ×1
BASKET HEART (ORDER IN 25'S) (MISCELLANEOUS) ×1
BASKET HEART (ORDER IN 25S) (MISCELLANEOUS) ×2 IMPLANT
BLADE STERNUM SYSTEM 6 (BLADE) ×4 IMPLANT
BLADE SURG 11 STRL SS (BLADE) ×2 IMPLANT
BNDG GAUZE ELAST 4 BULKY (GAUZE/BANDAGES/DRESSINGS) ×6 IMPLANT
CANISTER SUCTION 2500CC (MISCELLANEOUS) ×4 IMPLANT
CANN PRFSN 3/8XCNCT ST RT ANG (MISCELLANEOUS) ×2
CANNULA EZ GLIDE AORTIC 21FR (CANNULA) ×4 IMPLANT
CANNULA GUNDRY RCSP 15FR (MISCELLANEOUS) ×2 IMPLANT
CANNULA PRFSN 3/8XCNCT RT ANG (MISCELLANEOUS) IMPLANT
CANNULA VEN MTL TIP RT (MISCELLANEOUS) ×4
CANNULA VRC MALB SNGL STG 36FR (MISCELLANEOUS) IMPLANT
CARDIAC SUCTION (MISCELLANEOUS) ×4 IMPLANT
CARDIOBLATE CARDIAC ABLATION (MISCELLANEOUS)
CATH CPB KIT HENDRICKSON (MISCELLANEOUS) ×4 IMPLANT
CATH RETROPLEGIA CORONARY 14FR (CATHETERS) ×2 IMPLANT
CATH ROBINSON RED A/P 18FR (CATHETERS) ×6 IMPLANT
CATH THORACIC 36FR (CATHETERS) ×4 IMPLANT
CATH THORACIC 36FR RT ANG (CATHETERS) ×4 IMPLANT
CLIP TI MEDIUM 24 (CLIP) ×4 IMPLANT
CLIP TI WIDE RED SMALL 24 (CLIP) ×15 IMPLANT
CONN 1/2X1/2X1/2  BEN (MISCELLANEOUS) ×2
CONN 1/2X1/2X1/2 BEN (MISCELLANEOUS) ×1 IMPLANT
COVER SURGICAL LIGHT HANDLE (MISCELLANEOUS) ×4 IMPLANT
CRADLE DONUT ADULT HEAD (MISCELLANEOUS) ×4 IMPLANT
DERMABOND ADVANCED (GAUZE/BANDAGES/DRESSINGS) ×2
DERMABOND ADVANCED .7 DNX12 (GAUZE/BANDAGES/DRESSINGS) IMPLANT
DEVICE CARDIOBLATE CARDIAC ABL (MISCELLANEOUS) IMPLANT
DRAIN CHANNEL 15F RND FF W/TCR (WOUND CARE) ×6 IMPLANT
DRAPE CARDIOVASCULAR INCISE (DRAPES) ×4
DRAPE SLUSH/WARMER DISC (DRAPES) ×4 IMPLANT
DRAPE SRG 135X102X78XABS (DRAPES) ×2 IMPLANT
DRSG COVADERM 4X14 (GAUZE/BANDAGES/DRESSINGS) ×4 IMPLANT
ELECT REM PT RETURN 9FT ADLT (ELECTROSURGICAL) ×12
ELECTRODE REM PT RTRN 9FT ADLT (ELECTROSURGICAL) ×4 IMPLANT
EVACUATOR SILICONE 100CC (DRAIN) ×6 IMPLANT
GAUZE SPONGE 4X4 12PLY STRL (GAUZE/BANDAGES/DRESSINGS) ×10 IMPLANT
GLOVE BIO SURGEON STRL SZ 6 (GLOVE) ×12 IMPLANT
GLOVE BIO SURGEON STRL SZ 6.5 (GLOVE) ×6 IMPLANT
GLOVE BIO SURGEON STRL SZ8 (GLOVE) ×3 IMPLANT
GLOVE BIO SURGEONS STRL SZ 6.5 (GLOVE) ×3
GLOVE BIOGEL PI IND STRL 6 (GLOVE) ×1 IMPLANT
GLOVE BIOGEL PI IND STRL 6.5 (GLOVE) IMPLANT
GLOVE BIOGEL PI IND STRL 7.0 (GLOVE) IMPLANT
GLOVE BIOGEL PI INDICATOR 6 (GLOVE) ×2
GLOVE BIOGEL PI INDICATOR 6.5 (GLOVE) ×6
GLOVE BIOGEL PI INDICATOR 7.0 (GLOVE) ×4
GLOVE SURG SIGNA 7.5 PF LTX (GLOVE) ×16 IMPLANT
GOWN STRL REUS W/ TWL LRG LVL3 (GOWN DISPOSABLE) ×8 IMPLANT
GOWN STRL REUS W/ TWL XL LVL3 (GOWN DISPOSABLE) ×4 IMPLANT
GOWN STRL REUS W/TWL LRG LVL3 (GOWN DISPOSABLE) ×36
GOWN STRL REUS W/TWL XL LVL3 (GOWN DISPOSABLE) ×12
HEMOSTAT POWDER SURGIFOAM 1G (HEMOSTASIS) ×15 IMPLANT
HEMOSTAT SURGICEL 2X14 (HEMOSTASIS) ×6 IMPLANT
INSERT FOGARTY XLG (MISCELLANEOUS) IMPLANT
KIT BASIN OR (CUSTOM PROCEDURE TRAY) ×4 IMPLANT
KIT ROOM TURNOVER OR (KITS) ×4 IMPLANT
KIT SUCTION CATH 14FR (SUCTIONS) ×8 IMPLANT
KIT VASOVIEW W/TROCAR VH 2000 (KITS) ×4 IMPLANT
LINE VENT (MISCELLANEOUS) ×3 IMPLANT
LOOP VESSEL MAXI BLUE (MISCELLANEOUS) ×3 IMPLANT
LOOP VESSEL SUPERMAXI WHITE (MISCELLANEOUS) ×2 IMPLANT
MARKER GRAFT CORONARY BYPASS (MISCELLANEOUS) ×12 IMPLANT
NS IRRIG 1000ML POUR BTL (IV SOLUTION) ×22 IMPLANT
PACK OPEN HEART (CUSTOM PROCEDURE TRAY) ×4 IMPLANT
PAD ARMBOARD 7.5X6 YLW CONV (MISCELLANEOUS) ×8 IMPLANT
PAD ELECT DEFIB RADIOL ZOLL (MISCELLANEOUS) ×4 IMPLANT
PENCIL BUTTON HOLSTER BLD 10FT (ELECTRODE) ×10 IMPLANT
PROBE CRYO2-ABLATION MALLABLE (MISCELLANEOUS) IMPLANT
PUNCH AORTIC ROTATE 4.0MM (MISCELLANEOUS) ×2 IMPLANT
PUNCH AORTIC ROTATE 4.5MM 8IN (MISCELLANEOUS) IMPLANT
PUNCH AORTIC ROTATE 5MM 8IN (MISCELLANEOUS) IMPLANT
SET CARDIOPLEGIA MPS 5001102 (MISCELLANEOUS) ×2 IMPLANT
SPONGE LAP 18X18 X RAY DECT (DISPOSABLE) ×4 IMPLANT
STAPLER VISISTAT 35W (STAPLE) ×3 IMPLANT
SUCKER INTRACARDIAC WEIGHTED (SUCKER) ×3 IMPLANT
SURGIFLO W/THROMBIN 8M KIT (HEMOSTASIS) ×3 IMPLANT
SUT BONE WAX W31G (SUTURE) ×4 IMPLANT
SUT ETHIBOND 2 0 SH (SUTURE) ×4
SUT ETHIBOND 2 0 SH 36X2 (SUTURE) ×1 IMPLANT
SUT ETHILON 3 0 FSL (SUTURE) ×2 IMPLANT
SUT MNCRL AB 4-0 PS2 18 (SUTURE) ×9 IMPLANT
SUT PROLENE 3 0 SH DA (SUTURE) ×7 IMPLANT
SUT PROLENE 4 0 RB 1 (SUTURE) ×24
SUT PROLENE 4 0 SH DA (SUTURE) ×12 IMPLANT
SUT PROLENE 4-0 RB1 .5 CRCL 36 (SUTURE) IMPLANT
SUT PROLENE 6 0 C 1 30 (SUTURE) ×14 IMPLANT
SUT PROLENE 7 0 BV 1 (SUTURE) ×4 IMPLANT
SUT PROLENE 7 0 BV1 MDA (SUTURE) ×10 IMPLANT
SUT PROLENE 8 0 BV175 6 (SUTURE) ×6 IMPLANT
SUT SILK 1 TIES 10X30 (SUTURE) ×2 IMPLANT
SUT STEEL 6MS V (SUTURE) ×4 IMPLANT
SUT STEEL STERNAL CCS#1 18IN (SUTURE) IMPLANT
SUT STEEL SZ 6 DBL 3X14 BALL (SUTURE) ×4 IMPLANT
SUT VIC AB 1 CTX 27 (SUTURE) ×2 IMPLANT
SUT VIC AB 1 CTX 36 (SUTURE) ×8
SUT VIC AB 1 CTX36XBRD ANBCTR (SUTURE) ×4 IMPLANT
SUT VIC AB 2-0 CT1 27 (SUTURE) ×12
SUT VIC AB 2-0 CT1 TAPERPNT 27 (SUTURE) ×3 IMPLANT
SUT VIC AB 2-0 CTX 27 (SUTURE) IMPLANT
SUT VIC AB 2-0 SH 27 (SUTURE) ×8
SUT VIC AB 2-0 SH 27XBRD (SUTURE) IMPLANT
SUT VIC AB 3-0 SH 27 (SUTURE)
SUT VIC AB 3-0 SH 27X BRD (SUTURE) IMPLANT
SUT VIC AB 3-0 X1 27 (SUTURE) ×2 IMPLANT
SUT VICRYL 4-0 PS2 18IN ABS (SUTURE) IMPLANT
SUTURE E-PAK OPEN HEART (SUTURE) ×4 IMPLANT
SYS ATRICLIP LAA EXCLUSION 45 (CLIP) IMPLANT
SYSTEM SAHARA CHEST DRAIN ATS (WOUND CARE) ×4 IMPLANT
TAPE CLOTH SURG 4X10 WHT LF (GAUZE/BANDAGES/DRESSINGS) ×6 IMPLANT
TAPE PAPER 2X10 WHT MICROPORE (GAUZE/BANDAGES/DRESSINGS) ×3 IMPLANT
TOWEL OR 17X24 6PK STRL BLUE (TOWEL DISPOSABLE) ×8 IMPLANT
TOWEL OR 17X26 10 PK STRL BLUE (TOWEL DISPOSABLE) ×10 IMPLANT
TRAY FOLEY IC TEMP SENS 16FR (CATHETERS) ×4 IMPLANT
TUBE FEEDING 8FR 16IN STR KANG (MISCELLANEOUS) ×2 IMPLANT
TUBING INSUFFLATION (TUBING) ×4 IMPLANT
UNDERPAD 30X30 INCONTINENT (UNDERPADS AND DIAPERS) ×4 IMPLANT
VRC MALLEABLE SINGLE STG 36FR (MISCELLANEOUS) ×4
WATER STERILE IRR 1000ML POUR (IV SOLUTION) ×8 IMPLANT

## 2014-01-27 NOTE — Progress Notes (Signed)
Patient ID: Francisco Merritt, male   DOB: 12/26/1929, 80 y.o.   MRN: [id number]   SICU Evening Rounds:   Hemodynamically stable but CI low at 1.0. PAD 16, SBP 98 on dop 5, neo, study drug.   Asleep on vent. Temp only 35.   Urine output good  CT output low  CBC    Component Value Date/Time   WBC 16.9* 01/31/2014 1650   RBC 3.11* 02/02/2014 1650   HGB 9.5* 01/28/2014 1651   HCT 28.0* 02/05/2014 1651   PLT 103* 01/29/2014 1650   MCV 96.5 02/03/2014 1650   MCH 32.8 01/31/2014 1650   MCHC 34.0 01/31/2014 1650   RDW 15.0 02/07/2014 1650   LYMPHSABS 1.4 01/06/2014 1305   MONOABS 1.1* 01/06/2014 1305   EOSABS 0.1 01/06/2014 1305   BASOSABS 0.0 01/06/2014 1305     BMET    Component Value Date/Time   NA 138 01/25/2014 1651   K 4.1 01/30/2014 1651   CL 104 01/22/2014 1522   CO2 22 01/22/2014 0540   GLUCOSE 134* 01/24/2014 1651   BUN 19 02/05/2014 1522   CREATININE 1.00 01/18/2014 1522   CALCIUM 9.5 02/02/2014 0540   GFRNONAA 41* 02/11/2014 0540   GFRAA 48* 01/18/2014 0540     A/P:  Stable postop course. CI is low but I think he just needs some volume. Use albumin as needed. Continue current plans

## 2014-01-27 NOTE — OR Nursing (Signed)
Second call to SICU 

## 2014-01-27 NOTE — Brief Op Note (Addendum)
01/24/2014 - 01/26/2014  3:08 PM  PATIENT:  Francisco Merritt  80 y.o. male  PRE-OPERATIVE DIAGNOSIS:  Coronary Artery Disease, Atrial Fibrillation  POST-OPERATIVE DIAGNOSIS:  Coronary Artery Disease, Atrial Fibrillation  PROCEDURE:   CORONARY ARTERY BYPASS GRAFTING x 3   LIMA-LAD  SVG-DIAG-OM ENDOSCOPIC VEIN HARVEST BILATERAL THIGHS CRYOMAZE PROCEDURE- left and right side LET ATRIAL CLIP  SURGEON:  Melrose Nakayama, MD  ASSISTANT: Suzzanne Cloud, PA-C  ANESTHESIA:   general  PATIENT CONDITION:  ICU - intubated and hemodynamically stable.  PRE-OPERATIVE WEIGHT: 94 kg  FINDINGS: Vein from right leg fair quality with a section that was unusable  Vein from left thigh not usable  LAD and OM good targets, diagonal fair  Study drug discontinued due to technical issues intraoperatively  Dictation # [id number]

## 2014-01-27 NOTE — Interval H&P Note (Signed)
History and Physical Interval Note:  02/07/2014 7:58 AM  Francisco Merritt  has presented today for surgery, with the diagnosis of CAD, Afib  The various methods of treatment have been discussed with the patient and family. After consideration of risks, benefits and other options for treatment, the patient has consented to  Procedure(s): CORONARY ARTERY BYPASS GRAFTING (CABG) (N/A) MAZE (N/A) INTRAOPERATIVE TRANSESOPHAGEAL ECHOCARDIOGRAM (N/A) as a surgical intervention .  The patient's history has been reviewed, patient examined, no change in status, stable for surgery.  I have reviewed the patient's chart and labs.  Questions were answered to the patient's satisfaction.     Francisco Merritt

## 2014-01-27 NOTE — Anesthesia Preprocedure Evaluation (Addendum)
Anesthesia Evaluation  Patient identified by MRN, date of birth, ID band Patient awake    Reviewed: Allergy & Precautions, H&P , NPO status , Patient's Chart, lab work & pertinent test results  Airway Mallampati: II  Neck ROM: full    Dental  (+) Dental Advisory Given, Edentulous Upper, Edentulous Lower   Pulmonary shortness of breath,          Cardiovascular hypertension, + angina + CAD, + Peripheral Vascular Disease and +CHF + dysrhythmias Atrial Fibrillation + pacemaker     Neuro/Psych    GI/Hepatic GERD-  ,  Endo/Other    Renal/GU ARFRenal disease     Musculoskeletal  (+) Arthritis -,   Abdominal   Peds  Hematology   Anesthesia Other Findings   Reproductive/Obstetrics                         Anesthesia Physical Anesthesia Plan  ASA: IV  Anesthesia Plan: General   Post-op Pain Management:    Induction: Intravenous  Airway Management Planned: Oral ETT  Additional Equipment: Arterial line, CVP, PA Cath, TEE and Ultrasound Guidance Line Placement  Intra-op Plan:   Post-operative Plan: Post-operative intubation/ventilation  Informed Consent: I have reviewed the patients History and Physical, chart, labs and discussed the procedure including the risks, benefits and alternatives for the proposed anesthesia with the patient or authorized representative who has indicated his/her understanding and acceptance.     Plan Discussed with: CRNA, Anesthesiologist and Surgeon  Anesthesia Plan Comments:         Anesthesia Quick Evaluation

## 2014-01-27 NOTE — H&P (View-Only) (Signed)
[address]       [place] [address]             [phone]      2 Days Post-Op Procedure(s) (LRB): CORONARY ARTERY BYPASS GRAFTING (CABG) (N/A) MAZE (N/A) TRANSESOPHAGEAL ECHOCARDIOGRAM (TEE) (N/A) Subjective: Feels ok, had a brief episode of left ant chest pain< 30 seconds.  Objective: Vital signs in last 24 hours: Temp:  [97.2 F (36.2 C)-98 F (36.7 C)] 97.8 F (36.6 C) (10/14 0756) Pulse Rate:  [70-88] 78 (10/14 0756) Cardiac Rhythm:  [-] Ventricular paced;Atrial fibrillation (10/13 2000) Resp:  [17-18] 18 (10/14 0756) BP: (103-140)/(52-82) 121/65 mmHg (10/14 0756) SpO2:  [96 %-99 %] 99 % (10/14 0756) Weight:  [204 lb 12.8 oz (92.897 kg)] 204 lb 12.8 oz (92.897 kg) (10/14 0500)  Hemodynamic parameters for last 24 hours:    Intake/Output from previous day: 10/13 0701 - 10/14 0700 In: 840 [P.O.:840] Out: 1226 [Urine:1225; Stool:1] Intake/Output this shift:    General appearance: alert, cooperative and no distress Heart: irregularly irregular rhythm Lungs: clear to auscultation bilaterally Abdomen: benign Extremities: no edema  Lab Results: No results found for this basename: WBC, HGB, HCT, PLT,  in the last 72 hours BMET:  Recent Labs  01/25/14 0910 01/26/14 0436  NA 140 141  K 3.7 4.1  CL 103 106  CO2 23 22  GLUCOSE 133* 99  BUN 26* 25*  CREATININE 1.54* 1.55*  CALCIUM 9.0 9.1    PT/INR: No results found for this basename: LABPROT, INR,  in the last 72 hours ABG    Component Value Date/Time   PHART 7.395 01/15/2014 1607   HCO3 21.7 01/17/2014 1607   TCO2 23 02/04/2014 1607   ACIDBASEDEF 3.0* 01/31/2014 1607   O2SAT 95.0 02/01/2014 1607   CBG (last 3)  No results found for this basename: GLUCAP,  in the last 72 hours  Meds Scheduled Meds: . amiodarone  400 mg Oral BID  . aspirin EC  81 mg Oral Daily  . atorvastatin  40 mg Oral q1800  . carvedilol  6.25 mg Oral BID WC  . heparin  5,000 Units Subcutaneous 3 times per  day  . isosorbide mononitrate  30 mg Oral Daily  . multivitamin with minerals  1 tablet Oral Daily  . pantoprazole  40 mg Oral Daily  . tamsulosin  0.4 mg Oral QPC supper   Continuous Infusions:  PRN Meds:.[MAR HOLD] sodium chloride, [MAR HOLD] acetaminophen, [MAR HOLD] ALPRAZolam, [MAR HOLD] nitroGLYCERIN, [MAR HOLD] ondansetron (ZOFRAN) IV, [MAR HOLD] oxyCODONE-acetaminophen, [MAR HOLD] traMADol  Xrays US Renal  02/07/2014   CLINICAL DATA:  Acute on chronic renal failure, hypertension  EXAM: RENAL/URINARY TRACT ULTRASOUND COMPLETE  COMPARISON:  None.  FINDINGS: Right Kidney:  Length: 11.5 cm. No hydronephrosis. 1.1 x 0.8 x 1 cm anechoic lower pole renal mass most consistent with a cyst. Increased renal cortical echogenicity with cortical thinning.  Left Kidney:  Length: 11.7 cm. Increased renal cortical echogenicity with cortical thinning.No mass or hydronephrosis visualized.  Bladder:  Appears normal for degree of bladder distention.  IMPRESSION: 1. No obstructive uropathy. 2. Increased renal cortical echogenicity and cortical thinning as can be seen with medical renal disease.   Electronically Signed   By: Kathreen Devoid   On: 01/29/2014 19:34    Assessment/Plan: S/P Procedure(s) (LRB): CORONARY ARTERY BYPASS GRAFTING (CABG) (N/A) MAZE (N/A) TRANSESOPHAGEAL ECHOCARDIOGRAM (TEE) (N/A)  1 stable, he does not typically have chest pain and episode  may not be angina. (was very brief) 2 creat stable 3 plans as schedule will allow re: surgery    LOS: 7 days    GOLD,WAYNE E 01/26/2014  Had transient CP this AM, resolved spontaneously Creatinine stable at 1.55- still high risk for perioperative renal issues We were able to secure a 4th cardiac room for tomorrow so will proceed with CABG/ maze He is aware of the indications, risks and benefits

## 2014-01-27 NOTE — Significant Event (Signed)
3rd albumin given was mixed with calcium per verbal order from Dr. Roxan Hockey. Placed bairhugger on as temperature dropped to 35.7 degree Celsius.

## 2014-01-27 NOTE — Transfer of Care (Signed)
Immediate Anesthesia Transfer of Care Note  Patient: Francisco Merritt  Procedure(s) Performed: Procedure(s): CORONARY ARTERY BYPASS GRAFTING (CABG), ON PUMP, TIMES THREE, USING LEFT INTERNAL MAMMARY ARTERY, RIGHT GREATER SAPHENOUS VEIN HARVESTED ENDOSCOPICALLY. (N/A) MAZE (N/A) INTRAOPERATIVE TRANSESOPHAGEAL ECHOCARDIOGRAM (N/A)  Patient Location: PACU  Anesthesia Type:General  Level of Consciousness: Patient remains intubated per anesthesia plan  Airway & Oxygen Therapy: Patient remains intubated per anesthesia plan  Post-op Assessment: Post -op Vital signs reviewed and stable  Post vital signs: Reviewed and stable  Complications: No apparent anesthesia complications

## 2014-01-27 NOTE — OR Nursing (Signed)
45 min call made to SICU nurse  

## 2014-01-27 NOTE — Progress Notes (Signed)
*  PRELIMINARY RESULTS* Echocardiogram Echocardiogram Transesophageal has been performed.  Francisco Merritt 01/25/2014, 9:27 AM

## 2014-01-27 NOTE — Anesthesia Procedure Notes (Signed)
Procedure Name: Intubation Date/Time: 02/02/2014 8:25 AM Performed by: Octavio Graves Pre-anesthesia Checklist: Patient identified, Timeout performed, Emergency Drugs available, Suction available and Patient being monitored Patient Re-evaluated:Patient Re-evaluated prior to inductionOxygen Delivery Method: Circle system utilized Preoxygenation: Pre-oxygenation with 100% oxygen Intubation Type: IV induction Ventilation: Mask ventilation without difficulty Laryngoscope Size: Miller and 2 Grade View: Grade I Tube type: Oral Tube size: 8.0 mm Number of attempts: 1 Airway Equipment and Method: Stylet Placement Confirmation: ETT inserted through vocal cords under direct vision,  positive ETCO2 and breath sounds checked- equal and bilateral Secured at: 22 cm Tube secured with: Tape Dental Injury: Teeth and Oropharynx as per pre-operative assessment  Comments: IV induction Hodierene- intubation AM CRNA atraumatic teeth and mouth as preop- bilat BS Hoderiene

## 2014-01-28 ENCOUNTER — Inpatient Hospital Stay (HOSPITAL_COMMUNITY): Payer: Medicare HMO

## 2014-01-28 DIAGNOSIS — R570 Cardiogenic shock: Secondary | ICD-10-CM

## 2014-01-28 DIAGNOSIS — I480 Paroxysmal atrial fibrillation: Secondary | ICD-10-CM

## 2014-01-28 LAB — POCT I-STAT 3, ART BLOOD GAS (G3+)
Acid-base deficit: 2 mmol/L (ref 0.0–2.0)
Acid-base deficit: 2 mmol/L (ref 0.0–2.0)
BICARBONATE: 21.7 meq/L (ref 20.0–24.0)
Bicarbonate: 22.3 mEq/L (ref 20.0–24.0)
O2 Saturation: 99 %
O2 Saturation: 97 %
PCO2 ART: 35.2 mmHg (ref 35.0–45.0)
PH ART: 7.41 (ref 7.350–7.450)
PH ART: 7.432 (ref 7.350–7.450)
PO2 ART: 92 mmHg (ref 80.0–100.0)
Patient temperature: 37.3
TCO2: 23 mmol/L (ref 0–100)
TCO2: 23 mmol/L (ref 0–100)
pCO2 arterial: 32.7 mmHg — ABNORMAL LOW (ref 35.0–45.0)
pO2, Arterial: 115 mmHg — ABNORMAL HIGH (ref 80.0–100.0)

## 2014-01-28 LAB — PREPARE RBC (CROSSMATCH)

## 2014-01-28 LAB — CBC
HCT: 26.7 % — ABNORMAL LOW (ref 39.0–52.0)
HEMATOCRIT: 22.3 % — AB (ref 39.0–52.0)
Hemoglobin: 9.2 g/dL — ABNORMAL LOW (ref 13.0–17.0)
Hemoglobin: 7.6 g/dL — ABNORMAL LOW (ref 13.0–17.0)
MCH: 30.6 pg (ref 26.0–34.0)
MCH: 29.9 pg (ref 26.0–34.0)
MCHC: 34.5 g/dL (ref 30.0–36.0)
MCHC: 34.1 g/dL (ref 30.0–36.0)
MCV: 88.7 fL (ref 78.0–100.0)
MCV: 87.8 fL (ref 78.0–100.0)
PLATELETS: 99 10*3/uL — AB (ref 150–400)
Platelets: 131 10*3/uL — ABNORMAL LOW (ref 150–400)
RBC: 3.01 MIL/uL — ABNORMAL LOW (ref 4.22–5.81)
RBC: 2.54 MIL/uL — ABNORMAL LOW (ref 4.22–5.81)
RDW: 16.8 % — ABNORMAL HIGH (ref 11.5–15.5)
RDW: 17.8 % — AB (ref 11.5–15.5)
WBC: 12.1 10*3/uL — AB (ref 4.0–10.5)
WBC: 17.2 10*3/uL — ABNORMAL HIGH (ref 4.0–10.5)

## 2014-01-28 LAB — GLUCOSE, CAPILLARY
GLUCOSE-CAPILLARY: 132 mg/dL — AB (ref 70–99)
GLUCOSE-CAPILLARY: 122 mg/dL — AB (ref 70–99)
GLUCOSE-CAPILLARY: 95 mg/dL (ref 70–99)
GLUCOSE-CAPILLARY: 106 mg/dL — AB (ref 70–99)
GLUCOSE-CAPILLARY: 115 mg/dL — AB (ref 70–99)
GLUCOSE-CAPILLARY: 121 mg/dL — AB (ref 70–99)
Glucose-Capillary: 101 mg/dL — ABNORMAL HIGH (ref 70–99)
Glucose-Capillary: 105 mg/dL — ABNORMAL HIGH (ref 70–99)
Glucose-Capillary: 112 mg/dL — ABNORMAL HIGH (ref 70–99)
Glucose-Capillary: 113 mg/dL — ABNORMAL HIGH (ref 70–99)
Glucose-Capillary: 116 mg/dL — ABNORMAL HIGH (ref 70–99)
Glucose-Capillary: 128 mg/dL — ABNORMAL HIGH (ref 70–99)
Glucose-Capillary: 132 mg/dL — ABNORMAL HIGH (ref 70–99)
Glucose-Capillary: 139 mg/dL — ABNORMAL HIGH (ref 70–99)
Glucose-Capillary: 146 mg/dL — ABNORMAL HIGH (ref 70–99)
Glucose-Capillary: 147 mg/dL — ABNORMAL HIGH (ref 70–99)
Glucose-Capillary: 149 mg/dL — ABNORMAL HIGH (ref 70–99)
Glucose-Capillary: 153 mg/dL — ABNORMAL HIGH (ref 70–99)
Glucose-Capillary: 168 mg/dL — ABNORMAL HIGH (ref 70–99)

## 2014-01-28 LAB — TROPONIN I: Troponin I: >20 ng/mL (ref <0.30)

## 2014-01-28 LAB — POCT I-STAT, CHEM 8
BUN: 20 mg/dL (ref 6–23)
Calcium, Ion: 1.16 mmol/L (ref 1.13–1.30)
Chloride: 108 mEq/L (ref 96–112)
Creatinine, Ser: 1.8 mg/dL — ABNORMAL HIGH (ref 0.50–1.35)
GLUCOSE: 152 mg/dL — AB (ref 70–99)
HEMATOCRIT: 21 % — AB (ref 39.0–52.0)
HEMOGLOBIN: 7.1 g/dL — AB (ref 13.0–17.0)
POTASSIUM: 4.5 meq/L (ref 3.7–5.3)
Sodium: 138 mEq/L (ref 137–147)
TCO2: 22 mmol/L (ref 0–100)

## 2014-01-28 LAB — CK TOTAL AND CKMB (NOT AT ARMC)
CK, MB: 219.7 ng/mL — AB (ref 0.3–4.0)
CK, MB: 96.6 ng/mL — AB (ref 0.3–4.0)
RELATIVE INDEX: 18.6 — AB (ref 0.0–2.5)
RELATIVE INDEX: 17.5 — AB (ref 0.0–2.5)
Total CK: 1183 U/L — ABNORMAL HIGH (ref 7–232)
Total CK: 553 U/L — ABNORMAL HIGH (ref 7–232)

## 2014-01-28 LAB — CARBOXYHEMOGLOBIN
Carboxyhemoglobin: 1.9 % — ABNORMAL HIGH (ref 0.5–1.5)
METHEMOGLOBIN: 0.4 % (ref 0.0–1.5)
O2 SAT: 47.7 %
TOTAL HEMOGLOBIN: 9.1 g/dL — AB (ref 13.5–18.0)

## 2014-01-28 LAB — BASIC METABOLIC PANEL
Anion gap: 11 (ref 5–15)
BUN: 18 mg/dL (ref 6–23)
CHLORIDE: 109 meq/L (ref 96–112)
CO2: 21 mEq/L (ref 19–32)
CREATININE: 1.24 mg/dL (ref 0.50–1.35)
Calcium: 8.3 mg/dL — ABNORMAL LOW (ref 8.4–10.5)
GFR calc non Af Amer: 53 mL/min — ABNORMAL LOW (ref >90)
GFR, EST AFRICAN AMERICAN: 61 mL/min — AB (ref >90)
Glucose, Bld: 115 mg/dL — ABNORMAL HIGH (ref 70–99)
Potassium: 4.2 mEq/L (ref 3.7–5.3)
SODIUM: 141 meq/L (ref 137–147)

## 2014-01-28 LAB — CREATININE, SERUM
Creatinine, Ser: 1.7 mg/dL — ABNORMAL HIGH (ref 0.50–1.35)
GFR calc Af Amer: 42 mL/min — ABNORMAL LOW (ref >90)
GFR calc non Af Amer: 36 mL/min — ABNORMAL LOW (ref >90)

## 2014-01-28 LAB — MAGNESIUM
Magnesium: 2.9 mg/dL — ABNORMAL HIGH (ref 1.5–2.5)
Magnesium: 2.7 mg/dL — ABNORMAL HIGH (ref 1.5–2.5)

## 2014-01-28 MED ORDER — LEVALBUTEROL HCL 0.63 MG/3ML IN NEBU
0.6300 mg | INHALATION_SOLUTION | Freq: Four times a day (QID) | RESPIRATORY_TRACT | Status: DC
  Administered 2014-01-28 (×2): 0.63 mg via RESPIRATORY_TRACT
  Filled 2014-01-28 (×2): qty 3

## 2014-01-28 MED ORDER — AMIODARONE HCL IN DEXTROSE 360-4.14 MG/200ML-% IV SOLN
60.0000 mg/h | INTRAVENOUS | Status: AC
  Administered 2014-01-28 (×2): 60 mg/h via INTRAVENOUS

## 2014-01-28 MED ORDER — INSULIN ASPART 100 UNIT/ML ~~LOC~~ SOLN
0.0000 [IU] | SUBCUTANEOUS | Status: DC
  Administered 2014-01-28: 2 [IU] via SUBCUTANEOUS
  Administered 2014-01-28: 4 [IU] via SUBCUTANEOUS
  Administered 2014-01-28: 2 [IU] via SUBCUTANEOUS
  Administered 2014-01-29 (×2): 4 [IU] via SUBCUTANEOUS
  Administered 2014-01-29: 2 [IU] via SUBCUTANEOUS
  Administered 2014-01-29 (×3): 4 [IU] via SUBCUTANEOUS
  Administered 2014-01-30 (×2): 2 [IU] via SUBCUTANEOUS
  Administered 2014-01-30: 4 [IU] via SUBCUTANEOUS
  Administered 2014-01-30 (×2): 2 [IU] via SUBCUTANEOUS

## 2014-01-28 MED ORDER — CHLORHEXIDINE GLUCONATE CLOTH 2 % EX PADS
6.0000 | MEDICATED_PAD | Freq: Every day | CUTANEOUS | Status: AC
  Administered 2014-01-28 – 2014-02-01 (×5): 6 via TOPICAL

## 2014-01-28 MED ORDER — AMIODARONE HCL IN DEXTROSE 360-4.14 MG/200ML-% IV SOLN
30.0000 mg/h | INTRAVENOUS | Status: DC
Start: 2014-01-28 — End: 2014-02-06
  Administered 2014-01-28 – 2014-02-05 (×21): 30 mg/h via INTRAVENOUS
  Filled 2014-01-28 (×46): qty 200

## 2014-01-28 MED ORDER — INSULIN DETEMIR 100 UNIT/ML ~~LOC~~ SOLN
25.0000 [IU] | Freq: Once | SUBCUTANEOUS | Status: AC
  Administered 2014-01-28: 25 [IU] via SUBCUTANEOUS
  Filled 2014-01-28: qty 0.25

## 2014-01-28 MED ORDER — LEVALBUTEROL HCL 0.63 MG/3ML IN NEBU
0.6300 mg | INHALATION_SOLUTION | Freq: Four times a day (QID) | RESPIRATORY_TRACT | Status: DC | PRN
  Administered 2014-01-31 – 2014-02-06 (×7): 0.63 mg via RESPIRATORY_TRACT
  Filled 2014-01-28 (×7): qty 3

## 2014-01-28 MED ORDER — INSULIN DETEMIR 100 UNIT/ML ~~LOC~~ SOLN
25.0000 [IU] | Freq: Every day | SUBCUTANEOUS | Status: DC
  Administered 2014-01-29 – 2014-01-31 (×3): 25 [IU] via SUBCUTANEOUS
  Filled 2014-01-28 (×3): qty 0.25

## 2014-01-28 MED ORDER — AMIODARONE LOAD VIA INFUSION
150.0000 mg | Freq: Once | INTRAVENOUS | Status: AC
  Administered 2014-01-28: 150 mg via INTRAVENOUS
  Filled 2014-01-28: qty 83.34

## 2014-01-28 MED ORDER — DOPAMINE-DEXTROSE 3.2-5 MG/ML-% IV SOLN
5.0000 ug/kg/min | INTRAVENOUS | Status: DC
Start: 2014-01-28 — End: 2014-01-31
  Administered 2014-01-28 – 2014-01-30 (×3): 5 ug/kg/min via INTRAVENOUS
  Filled 2014-01-28 (×3): qty 250

## 2014-01-28 MED ORDER — NOREPINEPHRINE BITARTRATE 1 MG/ML IV SOLN
0.0000 ug/min | INTRAVENOUS | Status: DC
  Administered 2014-01-28: 17 ug/min via INTRAVENOUS
  Administered 2014-01-30: 19 ug/min via INTRAVENOUS
  Administered 2014-01-30: 20 ug/min via INTRAVENOUS
  Administered 2014-01-31: 18 ug/min via INTRAVENOUS
  Administered 2014-02-01: 7 ug/min via INTRAVENOUS
  Administered 2014-02-02: 6 ug/min via INTRAVENOUS
  Administered 2014-02-05: 2 ug/min via INTRAVENOUS
  Filled 2014-01-28 (×9): qty 16

## 2014-01-28 MED ORDER — MUPIROCIN 2 % EX OINT
1.0000 "application " | TOPICAL_OINTMENT | Freq: Two times a day (BID) | CUTANEOUS | Status: AC
  Administered 2014-01-28 – 2014-02-01 (×10): 1 via NASAL
  Filled 2014-01-28: qty 22

## 2014-01-28 MED ORDER — CETYLPYRIDINIUM CHLORIDE 0.05 % MT LIQD
7.0000 mL | Freq: Four times a day (QID) | OROMUCOSAL | Status: DC
  Administered 2014-01-28 – 2014-01-29 (×5): 7 mL via OROMUCOSAL

## 2014-01-28 MED ORDER — CHLORHEXIDINE GLUCONATE 0.12 % MT SOLN
15.0000 mL | Freq: Two times a day (BID) | OROMUCOSAL | Status: DC
  Administered 2014-01-28 – 2014-01-29 (×4): 15 mL via OROMUCOSAL
  Filled 2014-01-28 (×4): qty 15

## 2014-01-28 MED ORDER — FUROSEMIDE 10 MG/ML IJ SOLN
20.0000 mg/h | INTRAVENOUS | Status: DC
  Administered 2014-01-28: 5 mg/h via INTRAVENOUS
  Administered 2014-01-29 – 2014-01-30 (×2): 10 mg/h via INTRAVENOUS
  Administered 2014-01-31 – 2014-02-01 (×3): 20 mg/h via INTRAVENOUS
  Filled 2014-01-28 (×13): qty 25

## 2014-01-28 MED FILL — Heparin Sodium (Porcine) Inj 1000 Unit/ML: INTRAMUSCULAR | Qty: 10 | Status: AC

## 2014-01-28 MED FILL — Sodium Chloride IV Soln 0.9%: INTRAVENOUS | Qty: 2000 | Status: AC

## 2014-01-28 MED FILL — Sodium Bicarbonate IV Soln 8.4%: INTRAVENOUS | Qty: 50 | Status: AC

## 2014-01-28 MED FILL — Lidocaine HCl IV Inj 20 MG/ML: INTRAVENOUS | Qty: 5 | Status: AC

## 2014-01-28 MED FILL — Electrolyte-R (PH 7.4) Solution: INTRAVENOUS | Qty: 4000 | Status: AC

## 2014-01-28 MED FILL — Mannitol IV Soln 20%: INTRAVENOUS | Qty: 500 | Status: AC

## 2014-01-28 MED FILL — Magnesium Sulfate Inj 50%: INTRAMUSCULAR | Qty: 10 | Status: AC

## 2014-01-28 MED FILL — Heparin Sodium (Porcine) Inj 1000 Unit/ML: INTRAMUSCULAR | Qty: 30 | Status: AC

## 2014-01-28 MED FILL — Potassium Chloride Inj 2 mEq/ML: INTRAVENOUS | Qty: 40 | Status: AC

## 2014-01-28 NOTE — Progress Notes (Signed)
Patient was able to do NIF -20 and VC 1L

## 2014-01-28 NOTE — Anesthesia Postprocedure Evaluation (Signed)
  Anesthesia Post-op Note  Patient: Francisco Merritt  Procedure(s) Performed: Procedure(s): CORONARY ARTERY BYPASS GRAFTING (CABG), ON PUMP, TIMES THREE, USING LEFT INTERNAL MAMMARY ARTERY, RIGHT GREATER SAPHENOUS VEIN HARVESTED ENDOSCOPICALLY. (N/A) MAZE (N/A) INTRAOPERATIVE TRANSESOPHAGEAL ECHOCARDIOGRAM (N/A)  Patient Location: SICU  Anesthesia Type:General  Level of Consciousness: awake, alert , oriented and patient cooperative  Airway and Oxygen Therapy: Patient Spontanous Breathing and Patient connected to nasal cannula oxygen  Post-op Pain: moderate  Post-op Assessment: Post-op Vital signs reviewed, Respiratory Function Stable, Patent Airway, No signs of Nausea or vomiting and Pain level controlled, remains on hemodynamic support with pressors  Post-op Vital Signs: Reviewed and stable on pressors  Last Vitals:  Filed Vitals:   01/28/14 0815  BP:   Pulse: 89  Temp: 37.7 C  Resp: 21    Complications: No apparent anesthesia complications

## 2014-01-28 NOTE — Op Note (Signed)
NAMEMUTASIM, TUCKEY NO.:  [id number]  MEDICAL RECORD NO.:  [id number]  LOCATION:  2S12C                        FACILITY:  [facility]  PHYSICIAN:  Revonda Standard. Roxan Hockey, M.D.DATE OF BIRTH:  December 17, 1929  DATE OF PROCEDURE:  02/08/2014 DATE OF DISCHARGE:                              OPERATIVE REPORT   PREOPERATIVE DIAGNOSES:  Severe 2-vessel coronary disease and paroxysmal atrial fibrillation.  POSTOPERATIVE DIAGNOSES:  Severe 2-vessel coronary disease and paroxysmal atrial fibrillation.  PROCEDURE:  Median sternotomy, extracorporeal circulation, coronary artery bypass grafting x3 (left internal mammary artery to left anterior descending, saphenous vein graft to first diagonal and OM1); left and right-sided CryoMaze procedure; endoscopic vein harvest both thighs.  SURGEON:  Revonda Standard. Roxan Hockey, M.D.  ASSISTANT:  Suzzanne Cloud, P.A.  ANESTHESIA:  General.  FINDINGS:  Transesophageal echocardiography revealed ejection fraction 35%.  Mild aortic insufficiency. Vein from right leg fair quality with a segment that was unusable. Vein from left leg unusable. Mammary good quality.  CLINICAL NOTE:  Mr. Ribeiro is an 80 year old gentleman, who presented after a near syncopal spell. Workup included a cardiac catheterization which showed severe 2-vessel coronary disease with an ejection fraction of 30-35% by echocardiogram.  His coronary disease was not amenable to percutaneous intervention and he was referred for coronary artery bypass grafting and possible maze procedure.  The indications, risks, benefits, and alternatives were discussed in detail with the patient.  He understood and accepted the risks and wished to proceed with coronary bypass grafting and maze procedure.  OPERATIVE NOTE:  Mr. Hack was brought to the preoperative holding area on January 27, 2014.  There, Anesthesia placed a Swan-Ganz catheter and arterial blood pressure monitoring line.  He  was taken to the operating room, anesthetized, and intubated.  Intravenous antibiotics were administered.  Transesophageal echocardiography was performed.  It showed global hypokinesis with an EF in the 35% range.  There was mild aortic insufficiency.  A Foley catheter was placed.  The chest, abdomen, and legs were prepped and draped in usual sterile fashion. Incision made in the medial aspect of the right leg at the level of the knee.  The greater saphenous vein was identified.  It was harvested Endoscopically. The more proximal portion of vein turned out to be fair quality and suitable for grafting but the more distal portion near the knee was not usable.  Additional vein was harvested from the left thigh and that turned out to be completely unusable, therefore the decision was made not to graft the right coronary as there was not sufficient conduit.  Simultaneously with the vein harvest, a median sternotomy was performed and the left internal mammary artery was harvested using standard technique.  It was a good quality vessel with excellent flow when divided distally.  2000 units of heparin was administered during the vessel harvest.  The remainder of the full heparin dose was given prior to beginning cardiopulmonary bypass.  The pericardium was opened.  The ascending aorta was inspected.  There was no palpable atherosclerotic disease.  The aorta was cannulated via concentric 2-0 Ethibond pledgeted pursestring sutures.  A 28-French right angle venous cannula was placed via pursestring suture in the superior vena cava.  After confirming adequate anticoagulation with ACT measurement, cardiopulmonary bypass was instituted.  A pursestring suture was placed in the inferior aspect of the right atrium and a 36- [place] malleable venous cannula was placed into the inferior vena cava via this purse-string.  This cannula was connected to the bypass circuit and full bypass was commenced.  The  patient was cooled to 32 degrees Celsius.  The coronary arteries were inspected and anastomotic sites were chosen.  The first diagonal was felt to be graftable although was a very small vessel.  OM2 and the LAD were good quality targets.  The conduits were inspected and cut to length.  There was 1 segment of vein that was usable for the OM and diagonal sequential graft.  As noted, the mammary was good quality.  A retrograde cardioplegic cannula was placed via pursestring suture in the right atrium and directed in the coronary sinus.  An antegrade cardioplegic cannula was placed into the ascending aorta.  A foam pad was placed in the pericardium to insulate the heart. A temperature probe was placed in the myocardial septum.   The aorta was crossclamped.  The left ventricle was emptied via the aortic root vent.  Cardiac arrest then was achieved with a combination of cold antegrade and retrograde blood cardioplegia.  There was difficulty initially with antegrade cardioplegia due to left ventricular distention.  With retrograde cardioplegia, there was initial rapid septal cooling but then this slowed in rate.  There was a good diastolic arrest but only cooling to the 15 degree Celsius range after 2 L of cardioplegia.  The retrograde cannula was repositioned.  It could be palpated in the coronary sinus and there was a good wedge tracing with initial placement and appropriate pressure rise with cardioplegia administration subsequently.  The saphenous vein then was anastomosed sequentially to the first diagonal and first obtuse marginal. A side-to- side anastomosis was performed at the first diagonal and end-to-side to the first marginal.  The diagonal was a 1 mm fair to poor quality target vessel and the side-to-side anastomosis was performed with a running 7-0 Prolene suture.  There was flow through the anastomosis with the distal end of the vein occluded.  The distal end of the vein then was  beveled was anastomosed end-to-side to OM1.  This was an 1.5 mm good quality target.  This anastomosis also was done with a running 7-0 Prolene suture.  A probe passed easily proximally and distally at the completion of anastomosis.  Cardioplegia was administered down the graft and there was good flow and good hemostasis.  Additional cardioplegia was administered down the retrograde cannula in addition to the vein graft.  Next, the left internal mammary artery was brought through a window in the pericardium and the distal end was beveled.  It was then anastomosed end-to-side to the LAD.  The LAD was an 1.5 mm good quality vessel at the site of the anastomosis.  The mammary was 1.5 mm good quality conduit.  The end-to-side anastomosis was performed with a running 8-0 Prolene suture.  At the completion of the anastomosis, the bulldog clamp was removed.  Septal rewarming was noted.  The bulldog clamp was replaced.  The mammary pedicle was tacked to the epicardial surface of the heart with 6-0 Prolene sutures.  The right atrium was opened.  The coronary sinus catheter was placed back into the coronary sinus.  It had become dislodged.  Additional retrograde cardioplegia was administered.  The interatrial groove was dissected out and  the left atrium was opened.  A cryo probe then was used to perform a maze procedure completing the isolation of the right pulmonary veins.  The left-sided lesions were performed for 3 minutes at -70 degrees Celsius.  Next, the inferior portion of the left-sided pulmonary veins were isolated followed by the superior portion. A connecting lesion was taken from the right-sided pulmonary vein ablation line to the mitral anulus and finally the atrial appendage was completely isolated.  A vent line was placed across the mitral valve into the left ventricle.  The atrium then was closed in 2 layers with a running 4-0 Prolene sutures, the first was a running horizontal  mattress suture followed by a running simple suture.  De-airing was performed prior to tying the suture lines.  Additional cardioplegia was administered at 20-minute intervals during this portion of the procedure.  Rewarming was begun.  An incision was made in the mid body of the right atrium.  Cryolesions then were created from this incision to the superior and inferior vena cava and to the right atrial appendage.  The base of the right atrial appendage was circumferentially ablated.  A connecting lesion was made from the right atrial appendage to the tricuspid anulus and finally a connecting line from the inferior vena caval ablation line to the coronary sinus.  The right-sided lesions were done at 2 minutes at -70 degrees Celsius.  The right atrium was closed with 2 layers of running 4-0 Prolene suture in a similar fashion to the left atrium.  Additional cardioplegia was administered.  The vein graft was cut to length.  The proximal end was beveled.  The cardioplegic cannula was removed from the ascending aorta.  The proximal vein graft anastomosis was performed to a 4.0 mm punch aortotomy with a running 6-0 Prolene suture. The patient was placed in Trendelenburg position.  A warm dose of retrograde cardioplegia was administered.  The bulldog clamp was removed from the left mammary artery.  Additional de-airing was performed and the aortic crossclamp was removed.  Total crossclamp time was 137 minutes.  It should be noted that carbon dioxide was insufflated into the operative field during the entire open portion of the procedure.  While rewarming was completed, the proximal and distal anastomoses, and atrial closure lines were inspected for hemostasis.  The superior vena cava cannula was redirected into the right atrium.  The inferior vena cava cannula was removed.  The left atrium measured for a 45 mm atrial clip and this was applied on the left atrial appendage.  Epicardial  pacing wires were placed on the right ventricle and right atrium.  It should be noted that the patient was on the Levo study during the portion of procedure initially when there was difficulty with initial cardioplegia and cooling.  The patient was also having issues with low blood pressure due to a systemic vasodilatation the study drug was discontinued at that point in time.  When the patient had rewarmed to a core temperature of 37 degrees Celsius, he was weaned from cardiopulmonary bypass.  He was on 5 mcg/kg per minute of dopamine at the time of separation from bypass.  He was DDD paced via the external pacer at 90 beats per minute.  Initial cardiac index was 1.8 liters/minute/meter squared.  The patient remained hemodynamically stable throughout the postbypass period.  Postbypass transesophageal echocardiography showed no change in the mild aortic insufficiency.  Left ventricular function was slightly better, likely due to the ionotropic effects of dopamine.  A test dose of protamine was administered and was well tolerated.  The superior vena cava cannula and the aortic cannula were removed.  The remainder of the protamine was administered without incident.  The chest was irrigated with warm saline.  Hemostasis was achieved.  The pericardium was not closed.  Left pleural and mediastinal chest tubes were placed via separate subcostal incisions.  The sternum was closed with a combination of single and double heavy gauge stainless steel wires.  The pectoralis fascia, subcutaneous tissue, and skin were closed in standard fashion.  A drain was left in the left thigh due to bleeding in the skin incision at the knee was closed with staples.  All sponge, needle, and instrument counts were correct at the end of the procedure.  The patient was taken from the operating room to the Surgical Intensive Care Unit intubated and in good condition.    Revonda Standard Roxan Hockey, M.D.    SCH/MEDQ   D:  01/23/2014  T:  01/28/2014  Job:  [id number]

## 2014-01-28 NOTE — Progress Notes (Signed)
1 Day Post-Op Procedure(s) (LRB): CORONARY ARTERY BYPASS GRAFTING (CABG), ON PUMP, TIMES THREE, USING LEFT INTERNAL MAMMARY ARTERY, RIGHT GREATER SAPHENOUS VEIN HARVESTED ENDOSCOPICALLY. (N/A) MAZE (N/A) INTRAOPERATIVE TRANSESOPHAGEAL ECHOCARDIOGRAM (N/A) Subjective: Intubated, agitated  Objective: Vital signs in last 24 hours: Temp:  [95.5 F (35.3 Merritt)-99.7 F (37.6 Merritt)] 99.7 F (37.6 Merritt) (10/16 0700) Pulse Rate:  [68-93] 93 (10/16 0700) Cardiac Rhythm:  [-] Atrial fibrillation (10/16 0600) Resp:  [12-26] 22 (10/16 0700) BP: (85-126)/(47-103) 110/59 mmHg (10/16 0700) SpO2:  [98 %-100 %] 99 % (10/16 0700) Arterial Line BP: (78-128)/(40-77) 121/53 mmHg (10/16 0700) FiO2 (%):  [40 %-50 %] 40 % (10/16 0600) Weight:  [236 lb 12.4 oz (107.4 kg)] 236 lb 12.4 oz (107.4 kg) (10/16 0600)  Hemodynamic parameters for last 24 hours: PAP: (28-48)/(14-26) 37/23 mmHg CO:  [2.1 L/min-3.9 L/min] 3.3 L/min CI:  [1 L/min/m2-2.6 L/min/m2] 1.5 L/min/m2  Intake/Output from previous day: 10/15 0701 - 10/16 0700 In: 7282.9 [I.V.:3068.5; Blood:1314.3; NG/GT:50; IV Piggyback:2850] Out: 6519 [Urine:3375; Drains:345; Blood:2329; Chest Tube:470] Intake/Output this shift:    General appearance: alert and agitated by suctioning but responds to verbal instruction Neurologic: moves all 4 extremities Heart: irregularly irregular rhythm Lungs: rhonchi bilaterally Abdomen: normal findings: soft, non-tender  Lab Results:  Recent Labs  01/23/2014 2320 01/28/14 0320  WBC 11.3* 12.1*  HGB 8.7* 9.2*  HCT 25.4* 26.7*  PLT 81* 99*   BMET:  Recent Labs  01/28/2014 0540  01/22/2014 2233 01/28/14 0320  NA 139  < > 140 141  K 4.4  < > 4.4 4.2  CL 104  < > 107 109  CO2 22  --   --  21  GLUCOSE 101*  < > 127* 115*  BUN 25*  < > 16 18  CREATININE 1.52*  < > 1.10 1.24  CALCIUM 9.5  --   --  8.3*  < > = values in this interval not displayed.  PT/INR:  Recent Labs  01/23/2014 1650  LABPROT 20.5*  INR 1.74*    ABG    Component Value Date/Time   PHART 7.432 01/28/2014 0644   HCO3 21.7 01/28/2014 0644   TCO2 23 01/28/2014 0644   ACIDBASEDEF 2.0 01/28/2014 0644   O2SAT 97.0 01/28/2014 0644   CBG (last 3)   Recent Labs  01/29/2014 2255 01/28/14 0005 01/28/14 0112  GLUCAP 132* 128* 149*    Assessment/Plan: S/P Procedure(s) (LRB): CORONARY ARTERY BYPASS GRAFTING (CABG), ON PUMP, TIMES THREE, USING LEFT INTERNAL MAMMARY ARTERY, RIGHT GREATER SAPHENOUS VEIN HARVESTED ENDOSCOPICALLY. (N/A) MAZE (N/A) INTRAOPERATIVE TRANSESOPHAGEAL ECHOCARDIOGRAM (N/A) - CV- s/p CABG x 3, maze  Perioperative MI, complicated by cardiogenic shock and acute on chronic systolic heart failure  Low index- better after milrinone added- continue dopamine and milrinone  Atrial fibrillation- not surprising early after maze- on amiodarone gtt  RESP- ABG OK, NIF only -20 but VC 1L  Wean to extubate  Add xopenex  RENAL- creatinine below baseline- expect to rise significantly over next 48 hours  Massively volume overloaded  Start lasix gtt  ENDO- CBG elevated- transition to levemir later in day  Anemia secondary to ABL- better after transfusion, follow  Thrombocytopenia- mild, follow  DC chest tubes post extubation   LOS: 9 days    Francisco Merritt 01/28/2014

## 2014-01-28 NOTE — Procedures (Signed)
Extubation Procedure Note  Patient Details:   Name: Francisco Merritt DOB: 1929-07-18 MRN: [id number]   Airway Documentation:     Evaluation  O2 sats: stable throughout Complications: No apparent complications Patient did tolerate procedure well. Bilateral Breath Sounds: Rhonchi Suctioning: Airway Yes  Pt tolerated wean, NIF -20, VC 1L, positive for cuff leak. Pt extubated to 4L [place]. No dyspnea or stridor noted after extubation. Pt achieved around 560mL IS x 2 with decent effort. All vitals are currently within normal limits. RT will continue to monitor.   Mariam Dollar 01/28/2014, 8:46 AM

## 2014-01-28 NOTE — Progress Notes (Signed)
Every time patient dangles he vagals SBP to 70, becomes unresponsive and starts jerking his arms. Patient all better when BP comes back up, MOE x 4, speech clear, follows commands. Will continue to monitor.

## 2014-01-28 NOTE — Progress Notes (Signed)
CRITICAL VALUE ALERT  Critical value received: Troponin >20      CK[facility] 219.7  Date of notification:  01/28/14  Time of notification:  0425  Critical value read back: yes  Nurse who received alert:  Clearence Ped RN  MD notified (1st page):  Berneda Rose   Time of first page:  [phone]  No orders at this time.

## 2014-01-28 NOTE — Progress Notes (Signed)
Advanced Heart Failure Rounding Note   Subjective:     Francisco Merritt is a 80 y.o. male with a history of obesity, hypertension, CAD, paroxysmal atrial fibrillation, systolic HF EF 61-44%, tachybradycardia syndrome and is status post pacemaker   Underwent R/L cath 10/7.  Severe 2V CAD.  Underwent CABG and Maze on 10/15  Suffered perioperative MI with trop > 20. He had a rough night with very low cardiac output. Now extubated. On dopa 5. Milrinone 0.25 levophed 14. Lasix gtt. ECG non-acute  Still draining from chest tubes. Urine output ~80cc/hr. Complains of being sore.   CVP 16 PA 36/24 (29) CI 1.9   Objective:   Weight Range:  Vital Signs:   Temp:  [95.5 F (35.3 C)-100 F (37.8 C)] 100 F (37.8 C) (10/16 1800) Pulse Rate:  [68-94] 91 (10/16 1800) Resp:  [12-35] 28 (10/16 1800) BP: (85-126)/(45-103) 111/83 mmHg (10/16 1800) SpO2:  [92 %-100 %] 100 % (10/16 1800) Arterial Line BP: (78-128)/(32-95) 114/50 mmHg (10/16 1800) FiO2 (%):  [40 %-50 %] 40 % (10/16 0600) Weight:  [107.4 kg (236 lb 12.4 oz)] 107.4 kg (236 lb 12.4 oz) (10/16 0600) Last BM Date: 01/17/2014  Weight change: Filed Weights   01/26/14 0500 02/02/2014 0500 01/28/14 0600  Weight: 92.897 kg (204 lb 12.8 oz) 94.303 kg (207 lb 14.4 oz) 107.4 kg (236 lb 12.4 oz)    Intake/Output:   Intake/Output Summary (Last 24 hours) at 01/28/14 1816 Last data filed at 01/28/14 1800  Gross per 24 hour  Intake 5658.48 ml  Output   2820 ml  Net 2838.48 ml     Physical Exam: General:  Elderly. Lying in bed HEENT: normal Neck: supple. Swan in place . Carotids 2+ bilat; no bruits. No lymphadenopathy or thryomegaly appreciated. Cor: PMI nondisplaced. RRR. + sternal dressing. Chest tubes in place Lungs: decreased BS anteriorly Abdomen: soft, nontender, +distended. No hepatosplenomegaly. No bruits or masses. Good bowel sounds. Extremities: no cyanosis, clubbing, rash, 2-3+ edema Neuro: alert & orientedx3, cranial nerves  grossly intact. moves all 4 extremities w/o difficulty. Affect pleasant  Telemetry:  SR  Labs: Basic Metabolic Panel:  Recent Labs Lab 01/22/2014 0144 01/25/14 0910 01/26/14 0436 01/30/2014 0540  02/08/2014 1453 02/08/2014 1522 02/12/2014 1651 02/05/2014 2230 02/01/2014 2233 01/28/14 0320 01/28/14 1500 01/28/14 1508  NA 138 140 141 139  < > 136* 138 138  --  140 141  --  138  K 4.0 3.7 4.1 4.4  < > 5.3 4.4 4.1  --  4.4 4.2  --  4.5  CL 101 103 106 104  < > 104 104  --   --  107 109  --  108  CO2 23 23 22 22   --   --   --   --   --   --  21  --   --   GLUCOSE 116* 133* 99 101*  < > 142* 126* 134*  --  127* 115*  --  152*  BUN 31* 26* 25* 25*  < > 19 19  --   --  16 18  --  20  CREATININE 1.96* 1.54* 1.55* 1.52*  < > 1.00 1.00  --  1.12 1.10 1.24 1.70* 1.80*  CALCIUM 9.1 9.0 9.1 9.5  --   --   --   --   --   --  8.3*  --   --   MG  --   --   --   --   --   --   --   --  3.2*  --  2.9* 2.7*  --   < > = values in this interval not displayed.  Liver Function Tests:  Recent Labs Lab 01/22/14 1203 01/13/2014 0540  AST 19 28  ALT 11 21  ALKPHOS 54 64  BILITOT 0.6 0.8  PROT 6.7 7.4  ALBUMIN 3.2* 3.4*   No results found for this basename: LIPASE, AMYLASE,  in the last 168 hours No results found for this basename: AMMONIA,  in the last 168 hours  CBC:  Recent Labs Lab 01/19/2014 2041 01/26/2014 2100 01/24/2014 2233 01/18/2014 2320 01/28/14 0320 01/28/14 1500 01/28/14 1508  WBC 12.8* 14.6*  --  11.3* 12.1* 17.2*  --   HGB 7.0* 7.6* 8.5* 8.7* 9.2* 7.6* 7.1*  HCT 20.6* 22.6* 25.0* 25.4* 26.7* 22.3* 21.0*  MCV 92.8 92.2  --  89.4 88.7 87.8  --   PLT 81* 93*  --  81* 99* 131*  --     Cardiac Enzymes:  Recent Labs Lab 02/02/2014 0540 01/28/14 0320 01/28/14 1500  CKTOTAL 52 1183* 553*  CKMB 1.7 219.7* 96.6*  TROPONINI <0.30 >20.00* >20.00*    BNP: BNP (last 3 results)  Recent Labs  01/06/14 1305 01/23/2014 0540  PROBNP 2171.0* 1919.0*     Other results:    Imaging: Dg  Chest Portable 1 View In Am  01/28/2014   CLINICAL DATA:  Status post coronary artery bypass grafting. Hypoxia  EXAM: PORTABLE CHEST - 1 VIEW  COMPARISON:  January 27, 2014  FINDINGS: Endotracheal tube tip is 5.0 cm above the carina. Nasogastric tube tip and side port are below the diaphragm. Swan-Ganz catheter tip is in the main pulmonary outflow tract directed toward the right, stable. There is a left chest tube as well as a mediastinal drain. There is no appreciable pneumothorax.  There is evidence of underlying emphysematous change. There is cardiomegaly with mild interstitial edema and small effusions bilaterally. There is atelectatic change in the left lower lobe. There is evidence of coronary artery bypass grafting. There is a left atrial clamp, stable.  IMPRESSION: Tube and catheter positions as described without apparent pneumothorax. Findings are felt to represent a degree of congestive heart failure superimposed on emphysematous change. Left lower lobe atelectatic change present.   Electronically Signed   By: Lowella Grip M.D.   On: 01/28/2014 08:16   Dg Chest Portable 1 View  02/08/2014   CLINICAL DATA:  Postoperative evaluation, status post CABG.  EXAM: PORTABLE CHEST - 1 VIEW  COMPARISON:  Chest radiograph January 22, 2014  FINDINGS: Endotracheal tube tip projects 3.1 cm above the carina. Swan-Ganz catheter via RIGHT internal jugular venous approach with distal tip projecting in main pulmonary artery. Nasogastric tube side port past the gastroesophageal junction, distal tip not imaged. LEFT lung base chest tube, mediastinal chest tube. No pneumothorax.  Interval median sternotomy for apparent CABG. The cardiac silhouette appears mildly enlarged, unchanged. Tortuous mildly calcified aorta. Mild mediastinal widening appears projectional. Low inspiratory examination. Bibasilar strandy densities and slight blunting of the costophrenic angles. Dual lead LEFT cardiac pacemaker in situ. Soft tissue  planes and included osseous structures are nonsuspicious.  IMPRESSION: Status post median sternotomy. Mild mediastinal widening may be projectional. Trace pleural effusions and bibasilar atelectasis.  Well positioned life support lines as described above. No pneumothorax.  Stable cardiomegaly.   Electronically Signed   By: Elon Alas   On: 02/12/2014 17:12     Medications:     Scheduled Medications: . acetaminophen  1,000 mg Oral  4 times per day   Or  . acetaminophen (TYLENOL) oral liquid 160 mg/5 mL  1,000 mg Per Tube 4 times per day  . antiseptic oral rinse  7 mL Mouth Rinse QID  . aspirin EC  325 mg Oral Daily   Or  . aspirin  324 mg Per Tube Daily  . atorvastatin  40 mg Oral q1800  . bisacodyl  10 mg Oral Daily   Or  . bisacodyl  10 mg Rectal Daily  . cefUROXime (ZINACEF)  IV  1.5 g Intravenous Q12H  . chlorhexidine  15 mL Mouth Rinse BID  . Chlorhexidine Gluconate Cloth  6 each Topical Daily  . docusate sodium  200 mg Oral Daily  . insulin aspart  0-24 Units Subcutaneous 6 times per day  . [START ON 01/29/2014] insulin detemir  25 Units Subcutaneous Daily  . metoprolol tartrate  12.5 mg Oral BID   Or  . metoprolol tartrate  12.5 mg Per Tube BID  . mupirocin ointment  1 application Nasal BID  . [START ON 01/29/2014] pantoprazole  40 mg Oral Daily  . sodium chloride  3 mL Intravenous Q12H  . tamsulosin  0.4 mg Oral QPC supper    Infusions: . sodium chloride 20 mL/hr at 01/14/2014 2300  . sodium chloride 20 mL (01/28/14 0623)  . sodium chloride    . amiodarone 30 mg/hr (01/28/14 1442)  . dexmedetomidine 0.3 mcg/kg/hr (01/28/14 0600)  . DOPamine    . furosemide (LASIX) infusion 5 mg/hr (01/28/14 0753)  . lactated ringers 20 mL/hr at 02/02/2014 2300  . milrinone 0.25 mcg/kg/min (01/28/14 1442)  . nitroGLYCERIN Stopped (02/03/2014 1630)  . norepinephrine (LEVOPHED) Adult infusion 15 mcg/min (01/28/14 1455)  . phenylephrine (NEO-SYNEPHRINE) Adult infusion Stopped  (02/08/2014 2331)    PRN Medications: albumin human, levalbuterol, metoprolol, midazolam, morphine injection, ondansetron (ZOFRAN) IV, oxyCODONE, sodium chloride, traMADol   Assessment:   1. Coronary artery disease s/p CABG/Maze10/16 2. Perioperative NSTEMI 3. A/c systolic HF with cardiogenic shock 4. iCM EF 30-35% (preop) 5. [name] now NSR s/p Maze 6. Acute on CKD, stage III   Plan/Discussion:    He remains quite tenuous post-op s/p large peri-operative MI. Hemodynamics improved on current pressor regimen. Would continue. He is markedly volume overloaded. Agree with lasix gtt.  Unable to use b-blocker due to shock.  Now holding NSR with Maze and amio. Continue amio.  Hgb down to 7.0 Needs RBCs  Will follow with daily co-ox. Will need echo in the next day or two.   We will follow closely. Appreciate TCTS care.  The patient is critically ill with multiple organ systems failure and requires high complexity decision making for assessment and support, frequent evaluation and titration of therapies, application of advanced monitoring technologies and extensive interpretation of multiple databases.   Critical Care Time devoted to patient care services described in this note is 35 Minutes.   Benay Spice 6:16 PM

## 2014-01-28 NOTE — Progress Notes (Signed)
Dr. Cyndia Bent notified of bursts of a-fib with rates as high as 120s. Patient's PPM kicks in and paces him out of a-fib, but BP does not tolerate rhythm fluctuations. Also updated on drip rates, coox, indexs, and PADs. Order received for Amio drip. Will continue to closely monitor. Richarda Blade RN

## 2014-01-28 NOTE — Progress Notes (Signed)
Dangled patient and he drained 800cc bloody drainage from CT's. Dr. Roxan Hockey aware.

## 2014-01-29 ENCOUNTER — Inpatient Hospital Stay (HOSPITAL_COMMUNITY): Payer: Medicare HMO

## 2014-01-29 DIAGNOSIS — I2510 Atherosclerotic heart disease of native coronary artery without angina pectoris: Secondary | ICD-10-CM

## 2014-01-29 LAB — BASIC METABOLIC PANEL
Anion gap: 16 — ABNORMAL HIGH (ref 5–15)
Anion gap: 16 — ABNORMAL HIGH (ref 5–15)
BUN: 26 mg/dL — ABNORMAL HIGH (ref 6–23)
BUN: 33 mg/dL — ABNORMAL HIGH (ref 6–23)
CO2: 18 meq/L — AB (ref 19–32)
CO2: 17 mEq/L — ABNORMAL LOW (ref 19–32)
Calcium: 8.2 mg/dL — ABNORMAL LOW (ref 8.4–10.5)
Calcium: 8.3 mg/dL — ABNORMAL LOW (ref 8.4–10.5)
Chloride: 104 mEq/L (ref 96–112)
Chloride: 101 mEq/L (ref 96–112)
Creatinine, Ser: 2.24 mg/dL — ABNORMAL HIGH (ref 0.50–1.35)
Creatinine, Ser: 2.76 mg/dL — ABNORMAL HIGH (ref 0.50–1.35)
GFR calc Af Amer: 30 mL/min — ABNORMAL LOW (ref >90)
GFR calc Af Amer: 23 mL/min — ABNORMAL LOW (ref >90)
GFR calc non Af Amer: 26 mL/min — ABNORMAL LOW (ref >90)
GFR calc non Af Amer: 20 mL/min — ABNORMAL LOW (ref >90)
Glucose, Bld: 174 mg/dL — ABNORMAL HIGH (ref 70–99)
Glucose, Bld: 142 mg/dL — ABNORMAL HIGH (ref 70–99)
Potassium: 4.8 mEq/L (ref 3.7–5.3)
Potassium: 4.7 mEq/L (ref 3.7–5.3)
SODIUM: 138 meq/L (ref 137–147)
Sodium: 134 mEq/L — ABNORMAL LOW (ref 137–147)

## 2014-01-29 LAB — CK TOTAL AND CKMB (NOT AT ARMC)
CK, MB: 37.1 ng/mL (ref 0.3–4.0)
CK, MB: 20.1 ng/mL (ref 0.3–4.0)
Relative Index: 10.2 — ABNORMAL HIGH (ref 0.0–2.5)
Relative Index: 5.8 — ABNORMAL HIGH (ref 0.0–2.5)
Total CK: 363 U/L — ABNORMAL HIGH (ref 7–232)
Total CK: 347 U/L — ABNORMAL HIGH (ref 7–232)

## 2014-01-29 LAB — CBC
HCT: 24.8 % — ABNORMAL LOW (ref 39.0–52.0)
HEMATOCRIT: 24.2 % — AB (ref 39.0–52.0)
HEMOGLOBIN: 8.2 g/dL — AB (ref 13.0–17.0)
Hemoglobin: 8.6 g/dL — ABNORMAL LOW (ref 13.0–17.0)
MCH: 29.6 pg (ref 26.0–34.0)
MCH: 30 pg (ref 26.0–34.0)
MCHC: 33.9 g/dL (ref 30.0–36.0)
MCHC: 34.7 g/dL (ref 30.0–36.0)
MCV: 87.4 fL (ref 78.0–100.0)
MCV: 86.4 fL (ref 78.0–100.0)
Platelets: 148 10*3/uL — ABNORMAL LOW (ref 150–400)
Platelets: 108 10*3/uL — ABNORMAL LOW (ref 150–400)
RBC: 2.77 MIL/uL — ABNORMAL LOW (ref 4.22–5.81)
RBC: 2.87 MIL/uL — ABNORMAL LOW (ref 4.22–5.81)
RDW: 17.6 % — ABNORMAL HIGH (ref 11.5–15.5)
RDW: 17 % — ABNORMAL HIGH (ref 11.5–15.5)
WBC: 16.3 10*3/uL — AB (ref 4.0–10.5)
WBC: 17.3 10*3/uL — ABNORMAL HIGH (ref 4.0–10.5)

## 2014-01-29 LAB — GLUCOSE, CAPILLARY
GLUCOSE-CAPILLARY: 182 mg/dL — AB (ref 70–99)
GLUCOSE-CAPILLARY: 163 mg/dL — AB (ref 70–99)
Glucose-Capillary: 138 mg/dL — ABNORMAL HIGH (ref 70–99)
Glucose-Capillary: 180 mg/dL — ABNORMAL HIGH (ref 70–99)
Glucose-Capillary: 194 mg/dL — ABNORMAL HIGH (ref 70–99)

## 2014-01-29 LAB — CARBOXYHEMOGLOBIN
CARBOXYHEMOGLOBIN: 1.1 % (ref 0.5–1.5)
METHEMOGLOBIN: 1.2 % (ref 0.0–1.5)
O2 Saturation: 50.5 %
Total hemoglobin: 9 g/dL — ABNORMAL LOW (ref 13.5–18.0)

## 2014-01-29 LAB — TROPONIN I
Troponin I: >20 ng/mL (ref <0.30)
Troponin I: >20 ng/mL (ref <0.30)

## 2014-01-29 MED ORDER — METOLAZONE 5 MG PO TABS
5.0000 mg | ORAL_TABLET | Freq: Once | ORAL | Status: AC
  Administered 2014-01-29: 5 mg via ORAL
  Filled 2014-01-29 (×2): qty 1

## 2014-01-29 MED ORDER — CETYLPYRIDINIUM CHLORIDE 0.05 % MT LIQD
7.0000 mL | Freq: Two times a day (BID) | OROMUCOSAL | Status: DC
  Administered 2014-01-29 – 2014-02-06 (×16): 7 mL via OROMUCOSAL

## 2014-01-29 MED ORDER — MORPHINE SULFATE 2 MG/ML IJ SOLN
2.0000 mg | INTRAMUSCULAR | Status: DC | PRN
  Administered 2014-01-31 – 2014-02-06 (×8): 2 mg via INTRAVENOUS
  Filled 2014-01-29 (×9): qty 1

## 2014-01-29 MED ORDER — FUROSEMIDE 10 MG/ML IJ SOLN
40.0000 mg | Freq: Once | INTRAMUSCULAR | Status: AC
  Administered 2014-01-29: 40 mg via INTRAVENOUS
  Filled 2014-01-29: qty 4

## 2014-01-29 MED ORDER — METOCLOPRAMIDE HCL 5 MG/ML IJ SOLN
10.0000 mg | Freq: Four times a day (QID) | INTRAMUSCULAR | Status: AC
  Administered 2014-01-29 – 2014-01-30 (×5): 10 mg via INTRAVENOUS
  Filled 2014-01-29 (×5): qty 2

## 2014-01-29 NOTE — Progress Notes (Signed)
Advanced Heart Failure Rounding Note   Subjective:     Francisco Merritt is a 80 y.o. male with a history of obesity, hypertension, CAD, paroxysmal atrial fibrillation, systolic HF EF 81-85%, tachybradycardia syndrome and is status post pacemaker  Underwent R/L cath 10/7.  Severe 2V CAD.  Underwent CABG and Maze on 10/15. (left internal mammary artery to left anterior  descending, saphenous vein graft to first diagonal and OM1);   Suffered perioperative MI with trop > 20.  On dopa 5. Milrinone 0.25 levophed 14. Lasix gtt at 6. I/os + Weight up 33 pounds.   Transfused overnight. Now getting 2nd unit RBCs. Renal function worse Cr 1.8->2.2  Drainage from chest tubes slowing down. Now 20cc/hr. Urine output low ~20cc/hr.   PA 41/25 Thermo 4.5/2.1  Objective:   Weight Range:  Vital Signs:   Temp:  [98.3 F (36.8 C)-100.2 F (37.9 C)] 98.6 F (37 C) (10/17 1100) Pulse Rate:  [86-94] 94 (10/17 1100) Resp:  [16-35] 24 (10/17 1100) BP: (83-121)/(44-83) 102/48 mmHg (10/17 1000) SpO2:  [88 %-100 %] 96 % (10/17 1100) Arterial Line BP: (87-127)/(41-95) 107/53 mmHg (10/17 1100) Weight:  [107.9 kg (237 lb 14 oz)] 107.9 kg (237 lb 14 oz) (10/17 0500) Last BM Date: 02/05/2014  Weight change: Filed Weights   01/25/2014 0500 01/28/14 0600 01/29/14 0500  Weight: 94.303 kg (207 lb 14.4 oz) 107.4 kg (236 lb 12.4 oz) 107.9 kg (237 lb 14 oz)    Intake/Output:   Intake/Output Summary (Last 24 hours) at 01/29/14 1105 Last data filed at 01/29/14 1100  Gross per 24 hour  Intake 3587.78 ml  Output   1210 ml  Net 2377.78 ml     Physical Exam: General:  Elderly. Lying in bed HEENT: normal Neck: supple. Swan in place . Carotids 2+ bilat; no bruits. No lymphadenopathy or thryomegaly appreciated. Cor: PMI nondisplaced. RRR. +s3 + sternal dressing. Chest tubes in place. + rub Lungs: decreased BS anteriorly Abdomen: soft, nontender, +distended. No hepatosplenomegaly. No bruits or masses. Good bowel  sounds. Extremities: no cyanosis, clubbing, rash, 3-4+ edema Neuro: alert & orientedx3, cranial nerves grossly intact. moves all 4 extremities w/o difficulty. Affect pleasant  Telemetry:  SR  Labs: Basic Metabolic Panel:  Recent Labs Lab 01/25/14 0910 01/26/14 0436 02/03/2014 0540  02/03/2014 1522 01/26/2014 1651 02/09/2014 2230 01/22/2014 2233 01/28/14 0320 01/28/14 1500 01/28/14 1508 01/29/14 0320  NA 140 141 139  < > 138 138  --  140 141  --  138 138  K 3.7 4.1 4.4  < > 4.4 4.1  --  4.4 4.2  --  4.5 4.8  CL 103 106 104  < > 104  --   --  107 109  --  108 104  CO2 23 22 22   --   --   --   --   --  21  --   --  18*  GLUCOSE 133* 99 101*  < > 126* 134*  --  127* 115*  --  152* 174*  BUN 26* 25* 25*  < > 19  --   --  16 18  --  20 26*  CREATININE 1.54* 1.55* 1.52*  < > 1.00  --  1.12 1.10 1.24 1.70* 1.80* 2.24*  CALCIUM 9.0 9.1 9.5  --   --   --   --   --  8.3*  --   --  8.2*  MG  --   --   --   --   --   --  3.2*  --  2.9* 2.7*  --   --   < > = values in this interval not displayed.  Liver Function Tests:  Recent Labs Lab 01/22/14 1203 01/15/2014 0540  AST 19 28  ALT 11 21  ALKPHOS 54 64  BILITOT 0.6 0.8  PROT 6.7 7.4  ALBUMIN 3.2* 3.4*   No results found for this basename: LIPASE, AMYLASE,  in the last 168 hours No results found for this basename: AMMONIA,  in the last 168 hours  CBC:  Recent Labs Lab 02/07/2014 2100  01/26/2014 2320 01/28/14 0320 01/28/14 1500 01/28/14 1508 01/29/14 0320  WBC 14.6*  --  11.3* 12.1* 17.2*  --  16.3*  HGB 7.6*  < > 8.7* 9.2* 7.6* 7.1* 8.2*  HCT 22.6*  < > 25.4* 26.7* 22.3* 21.0* 24.2*  MCV 92.2  --  89.4 88.7 87.8  --  87.4  PLT 93*  --  81* 99* 131*  --  148*  < > = values in this interval not displayed.  Cardiac Enzymes:  Recent Labs Lab 01/20/2014 0540 01/28/14 0320 01/28/14 1500 01/29/14 0320  CKTOTAL 52 1183* 553* 363*  CKMB 1.7 219.7* 96.6* 37.1*  TROPONINI <0.30 >20.00* >20.00* >20.00*    BNP: BNP (last 3  results)  Recent Labs  01/06/14 1305 01/23/2014 0540  PROBNP 2171.0* 1919.0*     Other results:    Imaging: Dg Chest Port 1 View  01/29/2014   CLINICAL DATA:  Postop cardiac surgery  EXAM: PORTABLE CHEST - 1 VIEW  COMPARISON:  Radiograph 01/28/2014  FINDINGS: Interval extubation. Swan-Ganz catheter andleft chest tube are unchanged. Atrial clip noted. Mediastinal drain in place. No pneumothorax. Bibasilar atelectasis and small left effusion.  IMPRESSION: 1. Extubation without complication. 2. Basilar atelectasis in small left effusion.   Electronically Signed   By: Suzy Bouchard M.D.   On: 01/29/2014 09:16   Dg Chest Portable 1 View In Am  01/28/2014   CLINICAL DATA:  Status post coronary artery bypass grafting. Hypoxia  EXAM: PORTABLE CHEST - 1 VIEW  COMPARISON:  January 27, 2014  FINDINGS: Endotracheal tube tip is 5.0 cm above the carina. Nasogastric tube tip and side port are below the diaphragm. Swan-Ganz catheter tip is in the main pulmonary outflow tract directed toward the right, stable. There is a left chest tube as well as a mediastinal drain. There is no appreciable pneumothorax.  There is evidence of underlying emphysematous change. There is cardiomegaly with mild interstitial edema and small effusions bilaterally. There is atelectatic change in the left lower lobe. There is evidence of coronary artery bypass grafting. There is a left atrial clamp, stable.  IMPRESSION: Tube and catheter positions as described without apparent pneumothorax. Findings are felt to represent a degree of congestive heart failure superimposed on emphysematous change. Left lower lobe atelectatic change present.   Electronically Signed   By: Lowella Grip M.D.   On: 01/28/2014 08:16   Dg Chest Portable 1 View  02/05/2014   CLINICAL DATA:  Postoperative evaluation, status post CABG.  EXAM: PORTABLE CHEST - 1 VIEW  COMPARISON:  Chest radiograph January 22, 2014  FINDINGS: Endotracheal tube tip projects  3.1 cm above the carina. Swan-Ganz catheter via RIGHT internal jugular venous approach with distal tip projecting in main pulmonary artery. Nasogastric tube side port past the gastroesophageal junction, distal tip not imaged. LEFT lung base chest tube, mediastinal chest tube. No pneumothorax.  Interval median sternotomy for apparent CABG. The cardiac silhouette appears mildly enlarged,  unchanged. Tortuous mildly calcified aorta. Mild mediastinal widening appears projectional. Low inspiratory examination. Bibasilar strandy densities and slight blunting of the costophrenic angles. Dual lead LEFT cardiac pacemaker in situ. Soft tissue planes and included osseous structures are nonsuspicious.  IMPRESSION: Status post median sternotomy. Mild mediastinal widening may be projectional. Trace pleural effusions and bibasilar atelectasis.  Well positioned life support lines as described above. No pneumothorax.  Stable cardiomegaly.   Electronically Signed   By: Elon Alas   On: 01/30/2014 17:12     Medications:     Scheduled Medications: . acetaminophen  1,000 mg Oral 4 times per day   Or  . acetaminophen (TYLENOL) oral liquid 160 mg/5 mL  1,000 mg Per Tube 4 times per day  . antiseptic oral rinse  7 mL Mouth Rinse QID  . aspirin EC  325 mg Oral Daily   Or  . aspirin  324 mg Per Tube Daily  . atorvastatin  40 mg Oral q1800  . bisacodyl  10 mg Oral Daily   Or  . bisacodyl  10 mg Rectal Daily  . chlorhexidine  15 mL Mouth Rinse BID  . Chlorhexidine Gluconate Cloth  6 each Topical Daily  . docusate sodium  200 mg Oral Daily  . furosemide  40 mg Intravenous Once  . insulin aspart  0-24 Units Subcutaneous 6 times per day  . insulin detemir  25 Units Subcutaneous Daily  . metoprolol tartrate  12.5 mg Oral BID   Or  . metoprolol tartrate  12.5 mg Per Tube BID  . mupirocin ointment  1 application Nasal BID  . pantoprazole  40 mg Oral Daily  . sodium chloride  3 mL Intravenous Q12H  . tamsulosin   0.4 mg Oral QPC supper    Infusions: . sodium chloride 20 mL/hr at 01/29/14 1100  . sodium chloride 20 mL/hr at 01/29/14 1100  . sodium chloride    . amiodarone 30 mg/hr (01/29/14 1100)  . dexmedetomidine Stopped (01/29/14 0700)  . DOPamine 5 mcg/kg/min (01/29/14 1100)  . furosemide (LASIX) infusion 6 mg/hr (01/29/14 1100)  . lactated ringers 20 mL/hr at 01/29/14 1100  . milrinone 0.25 mcg/kg/min (01/29/14 1100)  . nitroGLYCERIN Stopped (01/22/2014 1630)  . norepinephrine (LEVOPHED) Adult infusion 13.973 mcg/min (01/29/14 1100)  . phenylephrine (NEO-SYNEPHRINE) Adult infusion Stopped (01/26/2014 2331)    PRN Medications: albumin human, levalbuterol, metoprolol, midazolam, morphine injection, ondansetron (ZOFRAN) IV, oxyCODONE, sodium chloride, traMADol   Assessment:   1. Coronary artery disease s/p CABG/Maze10/16 2. Perioperative NSTEMI 3. A/c systolic HF with cardiogenic shock 4. iCM EF 30-35% (preop) 5. [name] now NSR s/p Maze 6. Acute on CKD, stage III   Plan/Discussion:    He remains quite tenuous post-op s/p large peri-operative MI. He is markedly volume overloaded. Will increase lasix gtt to 10. Give one dose metolazone. Would avoid further albumin if at all possible as CVP already very high. No b-blocker due to shock.  Now holding NSR with Maze and amio. Continue amio.  Will check co-ox. Will need echo in the next day or two.   We will follow closely. Appreciate TCTS care.  The patient is critically ill with multiple organ systems failure and requires high complexity decision making for assessment and support, frequent evaluation and titration of therapies, application of advanced monitoring technologies and extensive interpretation of multiple databases.   Critical Care Time devoted to patient care services described in this note is 35 Minutes.   Eily Louvier,MD 11:05 AM

## 2014-01-29 NOTE — Progress Notes (Signed)
2 Days Post-Op Procedure(s) (LRB): CORONARY ARTERY BYPASS GRAFTING (CABG), ON PUMP, TIMES THREE, USING LEFT INTERNAL MAMMARY ARTERY, RIGHT GREATER SAPHENOUS VEIN HARVESTED ENDOSCOPICALLY. (N/A) MAZE (N/A) INTRAOPERATIVE TRANSESOPHAGEAL ECHOCARDIOGRAM (N/A) Subjective:  alert and responsive with minimal postoperative discomfort Chest tubes continue to drain serous fluid was low urine output Rising BUN and creatinine being monitored carefully Patient received unit of blood for postoperative expected blood loss anemia Objective: Vital signs in last 24 hours: Temp:  [97.8 F (36.6 C)-100.2 F (37.9 C)] 98.8 F (37.1 C) (10/17 1615) Pulse Rate:  [55-138] 87 (10/17 1615) Cardiac Rhythm:  [-] Normal sinus rhythm (10/17 1600) Resp:  [17-37] 25 (10/17 1615) BP: (83-121)/(39-83) 116/44 mmHg (10/17 1600) SpO2:  [83 %-100 %] 99 % (10/17 1615) Arterial Line BP: (73-128)/(38-91) 114/52 mmHg (10/17 1615) Weight:  [237 lb 14 oz (107.9 kg)] 237 lb 14 oz (107.9 kg) (10/17 0500)  Hemodynamic parameters for last 24 hours: PAP: (28-62)/(12-25) 38/20 mmHg CO:  [3.4 L/min-4.5 L/min] 4.5 L/min CI:  [1.6 L/min/m2-2.1 L/min/m2] 2.1 L/min/m2  Intake/Output from previous day: 10/16 0701 - 10/17 0700 In: 4051.5 [P.O.:120; I.V.:2831.5; Blood:300; IV Piggyback:800] Out: 3254 [Urine:715; Chest Tube:1070] Intake/Output this shift: Total I/O In: 1759.7 [P.O.:360; I.V.:1034.7; Blood:365] Out: 630 [Urine:130; Chest Tube:500]  Alert responsive Diminished breath sounds Abdomen nontender Positive peripheral edema  Lab Results:  Recent Labs  01/29/14 0320 01/29/14 1610  WBC 16.3* 17.3*  HGB 8.2* 8.6*  HCT 24.2* 24.8*  PLT 148* 108*   BMET:  Recent Labs  01/29/14 0320 01/29/14 1610  NA 138 134*  K 4.8 4.7  CL 104 101  CO2 18* 17*  GLUCOSE 174* 142*  BUN 26* 33*  CREATININE 2.24* 2.76*  CALCIUM 8.2* 8.3*    PT/INR:  Recent Labs  01/21/2014 1650  LABPROT 20.5*  INR 1.74*   ABG     Component Value Date/Time   PHART 7.432 01/28/2014 0644   HCO3 21.7 01/28/2014 0644   TCO2 22 01/28/2014 1508   ACIDBASEDEF 2.0 01/28/2014 0644   O2SAT 50.5 01/29/2014 1255   CBG (last 3)   Recent Labs  01/29/14 0022 01/29/14 0417 01/29/14 0751  GLUCAP 180* 194* 182*    Assessment/Plan: S/P Procedure(s) (LRB): CORONARY ARTERY BYPASS GRAFTING (CABG), ON PUMP, TIMES THREE, USING LEFT INTERNAL MAMMARY ARTERY, RIGHT GREATER SAPHENOUS VEIN HARVESTED ENDOSCOPICALLY. (N/A) MAZE (N/A) INTRAOPERATIVE TRANSESOPHAGEAL ECHOCARDIOGRAM (N/A) Perioperative MI with CPK-M.B. progressively decreased Continue milrinone and norepinephrine Rising BUN creatinine Postoperative expected blood loss anemia treated with blood transfusion Sinus alternating with junctional rhythm on amiodarone   LOS: 10 days    VAN TRIGT III,[place] Cordial 01/29/2014

## 2014-01-30 ENCOUNTER — Inpatient Hospital Stay (HOSPITAL_COMMUNITY): Payer: Medicare HMO

## 2014-01-30 DIAGNOSIS — Z950 Presence of cardiac pacemaker: Secondary | ICD-10-CM

## 2014-01-30 DIAGNOSIS — N178 Other acute kidney failure: Secondary | ICD-10-CM

## 2014-01-30 LAB — TYPE AND SCREEN
ABO/RH(D): O NEG
Antibody Screen: NEGATIVE
UNIT DIVISION: 0
UNIT DIVISION: 0
Unit division: 0
Unit division: 0
Unit division: 0
Unit division: 0

## 2014-01-30 LAB — BASIC METABOLIC PANEL
ANION GAP: 17 — AB (ref 5–15)
Anion gap: 17 — ABNORMAL HIGH (ref 5–15)
BUN: 37 mg/dL — ABNORMAL HIGH (ref 6–23)
BUN: 42 mg/dL — ABNORMAL HIGH (ref 6–23)
CALCIUM: 8.1 mg/dL — AB (ref 8.4–10.5)
CHLORIDE: 102 meq/L (ref 96–112)
CO2: 17 meq/L — AB (ref 19–32)
CO2: 19 mEq/L (ref 19–32)
Calcium: 8.2 mg/dL — ABNORMAL LOW (ref 8.4–10.5)
Chloride: 98 mEq/L (ref 96–112)
Creatinine, Ser: 3.15 mg/dL — ABNORMAL HIGH (ref 0.50–1.35)
Creatinine, Ser: 3.59 mg/dL — ABNORMAL HIGH (ref 0.50–1.35)
GFR calc Af Amer: 20 mL/min — ABNORMAL LOW (ref >90)
GFR calc Af Amer: 17 mL/min — ABNORMAL LOW (ref >90)
GFR calc non Af Amer: 17 mL/min — ABNORMAL LOW (ref >90)
GFR calc non Af Amer: 15 mL/min — ABNORMAL LOW (ref >90)
Glucose, Bld: 130 mg/dL — ABNORMAL HIGH (ref 70–99)
Glucose, Bld: 114 mg/dL — ABNORMAL HIGH (ref 70–99)
Potassium: 4.3 mEq/L (ref 3.7–5.3)
Potassium: 4.1 mEq/L (ref 3.7–5.3)
SODIUM: 136 meq/L — AB (ref 137–147)
Sodium: 134 mEq/L — ABNORMAL LOW (ref 137–147)

## 2014-01-30 LAB — CBC
HCT: 23.6 % — ABNORMAL LOW (ref 39.0–52.0)
HCT: 24.5 % — ABNORMAL LOW (ref 39.0–52.0)
HEMOGLOBIN: 7.9 g/dL — AB (ref 13.0–17.0)
Hemoglobin: 8.4 g/dL — ABNORMAL LOW (ref 13.0–17.0)
MCH: 29.8 pg (ref 26.0–34.0)
MCH: 29.8 pg (ref 26.0–34.0)
MCHC: 33.5 g/dL (ref 30.0–36.0)
MCHC: 34.3 g/dL (ref 30.0–36.0)
MCV: 89.1 fL (ref 78.0–100.0)
MCV: 86.9 fL (ref 78.0–100.0)
Platelets: 89 10*3/uL — ABNORMAL LOW (ref 150–400)
Platelets: 87 10*3/uL — ABNORMAL LOW (ref 150–400)
RBC: 2.65 MIL/uL — ABNORMAL LOW (ref 4.22–5.81)
RBC: 2.82 MIL/uL — ABNORMAL LOW (ref 4.22–5.81)
RDW: 17.2 % — AB (ref 11.5–15.5)
RDW: 17 % — ABNORMAL HIGH (ref 11.5–15.5)
WBC: 16 10*3/uL — AB (ref 4.0–10.5)
WBC: 17.5 10*3/uL — ABNORMAL HIGH (ref 4.0–10.5)

## 2014-01-30 LAB — POCT I-STAT 3, ART BLOOD GAS (G3+)
ACID-BASE DEFICIT: 6 mmol/L — AB (ref 0.0–2.0)
BICARBONATE: 18.3 meq/L — AB (ref 20.0–24.0)
O2 Saturation: 94 %
PCO2 ART: 29.7 mmHg — AB (ref 35.0–45.0)
PO2 ART: 68 mmHg — AB (ref 80.0–100.0)
Patient temperature: 36.6
TCO2: 19 mmol/L (ref 0–100)
pH, Arterial: 7.396 (ref 7.350–7.450)

## 2014-01-30 LAB — GLUCOSE, CAPILLARY
GLUCOSE-CAPILLARY: 132 mg/dL — AB (ref 70–99)
GLUCOSE-CAPILLARY: 137 mg/dL — AB (ref 70–99)
GLUCOSE-CAPILLARY: 138 mg/dL — AB (ref 70–99)
GLUCOSE-CAPILLARY: 171 mg/dL — AB (ref 70–99)
GLUCOSE-CAPILLARY: 177 mg/dL — AB (ref 70–99)
Glucose-Capillary: 103 mg/dL — ABNORMAL HIGH (ref 70–99)
Glucose-Capillary: 125 mg/dL — ABNORMAL HIGH (ref 70–99)

## 2014-01-30 LAB — CARBOXYHEMOGLOBIN
Carboxyhemoglobin: 1.2 % (ref 0.5–1.5)
Methemoglobin: 1.1 % (ref 0.0–1.5)
O2 SAT: 53.9 %
Total hemoglobin: 8.3 g/dL — ABNORMAL LOW (ref 13.5–18.0)

## 2014-01-30 MED ORDER — SODIUM BICARBONATE 8.4 % IV SOLN: 25.0000 meq | Freq: Once | INTRAVENOUS | Status: DC

## 2014-01-30 MED ORDER — METOLAZONE 5 MG PO TABS
5.0000 mg | ORAL_TABLET | Freq: Once | ORAL | Status: AC
  Administered 2014-01-30: 5 mg via ORAL
  Filled 2014-01-30: qty 1

## 2014-01-30 MED ORDER — MIDAZOLAM HCL 2 MG/2ML IJ SOLN
INTRAMUSCULAR | Status: AC
  Administered 2014-01-30: 1 mg
  Filled 2014-01-30: qty 2

## 2014-01-30 MED ORDER — AMIODARONE IV BOLUS ONLY 150 MG/100ML
150.0000 mg | Freq: Once | INTRAVENOUS | Status: AC
  Administered 2014-01-30: 150 mg via INTRAVENOUS

## 2014-01-30 MED ORDER — SODIUM BICARBONATE 8.4 % IV SOLN
50.0000 meq | Freq: Once | INTRAVENOUS | Status: AC
  Administered 2014-01-30: 50 meq via INTRAVENOUS

## 2014-01-30 NOTE — Brief Op Note (Signed)
01/18/2014 - 01/18/2014  12:13 PM  PATIENT:  Francisco Merritt  80 y.o. male  PRE-OPERATIVE DIAGNOSIS: acute renal failure after CABG  POST-OPERATIVE DIAGNOSIS:  Coronary Artery Disease,  Acute renal failure PROCEDURE:   Central Line placement SURGEON:  Surgeon(s) and Role:   Engineer, maintenance (IT)  PHYSICIAN ASSISTANT:0   ASSISTANTS: none   ANESTHESIA:   local  EBL:  Total I/O In: 186.6 [I.V.:186.6] Out: 90 [Urine:50; Chest Tube:40]  BLOOD ADMINISTERED:none  DRAINS: none   LOCAL MEDICATIONS USED:  XYLOCAINE  and Amount: 5 ml  SPECIMEN:  No Specimen  DISPOSITION OF SPECIMEN:  N/A  COUNTS:  YES  TOURNIQUET:  * No tourniquets in log *  DICTATION: .Dragon Dictation  PLAN OF CARE: cont current LOC  PATIENT DISPOSITION:  na   Delay start of Pharmacological VTE agent (>24hrs) due to surgical blood loss or risk of bleeding: yes

## 2014-01-30 NOTE — Progress Notes (Signed)
CT surgery p.m. Rounds  Patient resting comfortably, still somewhat sedated after 0.5 mg Versed given for central line placement. Chest x-ray shows line in good position for vasoactive drugs and daily co- ox. CVP 18  Blood pressure improved, urine out put improved PA catheter removed-patient will be mobilize out of bed to chair.

## 2014-01-30 NOTE — Progress Notes (Signed)
3 Days Post-Op Procedure(s) (LRB): CORONARY ARTERY BYPASS GRAFTING (CABG), ON PUMP, TIMES THREE, USING LEFT INTERNAL MAMMARY ARTERY, RIGHT GREATER SAPHENOUS VEIN HARVESTED ENDOSCOPICALLY. (N/A) MAZE (N/A) INTRAOPERATIVE TRANSESOPHAGEAL ECHOCARDIOGRAM (N/A) Subjective: Overall improved Stable cardiac index, NE has been weaned Urine output better, creat plateau Will DC swan so he can get OOB- PT ordered  Objective: Vital signs in last 24 hours: Temp:  [96.2 F (35.7 C)-99.3 F (37.4 C)] 96.2 F (35.7 C) (10/18 0741) Pulse Rate:  [55-138] 89 (10/18 0700) Cardiac Rhythm:  [-] Normal sinus rhythm (10/18 0800) Resp:  [14-37] 16 (10/18 0700) BP: (69-116)/(35-67) 94/58 mmHg (10/18 0700) SpO2:  [83 %-100 %] 100 % (10/18 0700) Arterial Line BP: (68-136)/(36-91) 95/43 mmHg (10/18 0700) Weight:  [241 lb 2.9 oz (109.4 kg)] 241 lb 2.9 oz (109.4 kg) (10/18 0600)  Hemodynamic parameters for last 24 hours: PAP: (28-62)/(12-30) 31/17 mmHg CO:  [4.1 L/min-5.5 L/min] 5.5 L/min CI:  [1.9 L/min/m2-2.5 L/min/m2] 2.5 L/min/m2  Intake/Output from previous day: 10/17 0701 - 10/18 0700 In: 3669.3 [P.O.:780; I.V.:2524.3; Blood:365] Out: 1400 [Urine:600; Chest Tube:800] Intake/Output this shift: Total I/O In: 186.6 [I.V.:186.6] Out: 90 [Urine:50; Chest Tube:40]  Neuro intact extrem warm  Lab Results:  Recent Labs  01/29/14 1610 01/30/14 0300  WBC 17.3* 16.0*  HGB 8.6* 7.9*  HCT 24.8* 23.6*  PLT 108* 89*   BMET:  Recent Labs  01/29/14 1610 01/30/14 0300  NA 134* 136*  K 4.7 4.3  CL 101 102  CO2 17* 17*  GLUCOSE 142* 130*  BUN 33* 37*  CREATININE 2.76* 3.15*  CALCIUM 8.3* 8.1*    PT/INR:  Recent Labs  01/25/2014 1650  LABPROT 20.5*  INR 1.74*   ABG    Component Value Date/Time   PHART 7.396 01/30/2014 0518   HCO3 18.3* 01/30/2014 0518   TCO2 19 01/30/2014 0518   ACIDBASEDEF 6.0* 01/30/2014 0518   O2SAT 94.0 01/30/2014 0518   CBG (last 3)   Recent Labs   01/29/14 2348 01/30/14 0312 01/30/14 0739  GLUCAP 137* 132* 125*    Assessment/Plan: S/P Procedure(s) (LRB): CORONARY ARTERY BYPASS GRAFTING (CABG), ON PUMP, TIMES THREE, USING LEFT INTERNAL MAMMARY ARTERY, RIGHT GREATER SAPHENOUS VEIN HARVESTED ENDOSCOPICALLY. (N/A) MAZE (N/A) INTRAOPERATIVE TRANSESOPHAGEAL ECHOCARDIOGRAM (N/A) Mobilize zoob Cont drips   LOS: 11 days    VAN TRIGT III,PETER 01/30/2014

## 2014-01-30 NOTE — Plan of Care (Signed)
Problem: Phase II - Intermediate Post-Op Goal: Maintain Hemodynamic Stability Outcome: Not Met (add Reason) Pt with diastolic HF requiring continued pressors and milrinone

## 2014-01-30 NOTE — Progress Notes (Signed)
SUBJECTIVE:  Awake with no complaints.  Had afib last night  OBJECTIVE:   Vitals:   Filed Vitals:   01/30/14 0600 01/30/14 0630 01/30/14 0700 01/30/14 0741  BP: 101/50 104/44 94/58   Pulse: 103 78 89   Temp: 97.9 F (36.6 C) 98.1 F (36.7 C) 98.1 F (36.7 C) 96.2 F (35.7 C)  TempSrc:    Oral  Resp: 18 16 16    Height:      Weight: 241 lb 2.9 oz (109.4 kg)     SpO2: 100% 99% 100%    I&O's:   Intake/Output Summary (Last 24 hours) at 01/30/14 1010 Last data filed at 01/30/14 0800  Gross per 24 hour  Intake   3490 ml  Output   1395 ml  Net   2095 ml   TELEMETRY: Reviewed telemetry pt in ? NSR with v pacing intermittently    PHYSICAL EXAM General: Well developed, well nourished, in no acute distress Lungs:   Clear bilaterally to auscultation anteriorly. Heart:   HRRR S1 S2  Abdomen: Bowel sounds are positive, abdomen soft and non-tender without masses Extremities:   No clubbing, cyanosis or edema.  DP +1 Neuro: Alert and oriented X 3. Psych:  Good affect, responds appropriately   LABS: Basic Metabolic Panel:  Recent Labs  01/28/14 0320 01/28/14 1500  01/29/14 1610 01/30/14 0300  NA 141  --   < > 134* 136*  K 4.2  --   < > 4.7 4.3  CL 109  --   < > 101 102  CO2 21  --   < > 17* 17*  GLUCOSE 115*  --   < > 142* 130*  BUN 18  --   < > 33* 37*  CREATININE 1.24 1.70*  < > 2.76* 3.15*  CALCIUM 8.3*  --   < > 8.3* 8.1*  MG 2.9* 2.7*  --   --   --   < > = values in this interval not displayed. Liver Function Tests: No results found for this basename: AST, ALT, ALKPHOS, BILITOT, PROT, ALBUMIN,  in the last 72 hours No results found for this basename: LIPASE, AMYLASE,  in the last 72 hours CBC:  Recent Labs  01/29/14 1610 01/30/14 0300  WBC 17.3* 16.0*  HGB 8.6* 7.9*  HCT 24.8* 23.6*  MCV 86.4 89.1  PLT 108* 89*   Cardiac Enzymes:  Recent Labs  01/28/14 1500 01/29/14 0320 01/29/14 1545  CKTOTAL 553* 363* 347*  CKMB 96.6* 37.1* 20.1*  TROPONINI  >20.00* >20.00* >20.00*   BNP: No components found with this basename: POCBNP,  D-Dimer: No results found for this basename: DDIMER,  in the last 72 hours Hemoglobin A1C: No results found for this basename: HGBA1C,  in the last 72 hours Fasting Lipid Panel: No results found for this basename: CHOL, HDL, LDLCALC, TRIG, CHOLHDL, LDLDIRECT,  in the last 72 hours Thyroid Function Tests: No results found for this basename: TSH, T4TOTAL, FREET3, T3FREE, THYROIDAB,  in the last 72 hours Anemia Panel: No results found for this basename: VITAMINB12, FOLATE, FERRITIN, TIBC, IRON, RETICCTPCT,  in the last 72 hours Coag Panel:   Lab Results  Component Value Date   INR 1.74* 01/22/2014   INR 1.18 01/19/2014   INR 1.30 01/20/2014   PROTIME 20.6 09/09/2008    RADIOLOGY: Dg Chest 2 View  01/22/2014   CLINICAL DATA:  Coronary artery disease  EXAM: CHEST  2 VIEW  COMPARISON:  01/06/2014  FINDINGS: Cardiomegaly again noted.  Dual lead cardiac pacemaker is unchanged in position. Mild basilar atelectasis. No acute infiltrate or pulmonary edema. Thoracic spine osteopenia.  IMPRESSION: No active disease. Mild basilar atelectasis. Cardiomegaly again noted.   Electronically Signed   By: Lahoma Crocker M.D.   On: 01/22/2014 16:27   US Renal  01/16/2014   CLINICAL DATA:  Acute on chronic renal failure, hypertension  EXAM: RENAL/URINARY TRACT ULTRASOUND COMPLETE  COMPARISON:  None.  FINDINGS: Right Kidney:  Length: 11.5 cm. No hydronephrosis. 1.1 x 0.8 x 1 cm anechoic lower pole renal mass most consistent with a cyst. Increased renal cortical echogenicity with cortical thinning.  Left Kidney:  Length: 11.7 cm. Increased renal cortical echogenicity with cortical thinning.No mass or hydronephrosis visualized.  Bladder:  Appears normal for degree of bladder distention.  IMPRESSION: 1. No obstructive uropathy. 2. Increased renal cortical echogenicity and cortical thinning as can be seen with medical renal disease.    Electronically Signed   By: Kathreen Devoid   On: 01/23/2014 19:34   Dg Chest Port 1 View  01/30/2014   CLINICAL DATA:  Status post CABG x3.  EXAM: PORTABLE CHEST - 1 VIEW  COMPARISON:  01/29/2014.  FINDINGS: There is a left chest wall pacer device with lead in the right atrial appendage and right ventricle. Left Swan-Ganz catheter tip is in the pulmonary artery. Mediastinal drain and left chest tube are in place. Heart size is enlarged. There is mild interstitial edema and small bilateral pleural effusions. No pneumothorax.  IMPRESSION: 1. Changes of mild CHF status post CABG procedure. 2. Left chest tube without pneumothorax.   Electronically Signed   By: Kerby Moors M.D.   On: 01/30/2014 07:48   Dg Chest Port 1 View  01/29/2014   CLINICAL DATA:  Postop cardiac surgery  EXAM: PORTABLE CHEST - 1 VIEW  COMPARISON:  Radiograph 01/28/2014  FINDINGS: Interval extubation. Swan-Ganz catheter andleft chest tube are unchanged. Atrial clip noted. Mediastinal drain in place. No pneumothorax. Bibasilar atelectasis and small left effusion.  IMPRESSION: 1. Extubation without complication. 2. Basilar atelectasis in small left effusion.   Electronically Signed   By: Suzy Bouchard M.D.   On: 01/29/2014 09:16   Dg Chest Portable 1 View In Am  01/28/2014   CLINICAL DATA:  Status post coronary artery bypass grafting. Hypoxia  EXAM: PORTABLE CHEST - 1 VIEW  COMPARISON:  January 27, 2014  FINDINGS: Endotracheal tube tip is 5.0 cm above the carina. Nasogastric tube tip and side port are below the diaphragm. Swan-Ganz catheter tip is in the main pulmonary outflow tract directed toward the right, stable. There is a left chest tube as well as a mediastinal drain. There is no appreciable pneumothorax.  There is evidence of underlying emphysematous change. There is cardiomegaly with mild interstitial edema and small effusions bilaterally. There is atelectatic change in the left lower lobe. There is evidence of coronary artery  bypass grafting. There is a left atrial clamp, stable.  IMPRESSION: Tube and catheter positions as described without apparent pneumothorax. Findings are felt to represent a degree of congestive heart failure superimposed on emphysematous change. Left lower lobe atelectatic change present.   Electronically Signed   By: Lowella Grip M.D.   On: 01/28/2014 08:16   Dg Chest Portable 1 View  01/20/2014   CLINICAL DATA:  Postoperative evaluation, status post CABG.  EXAM: PORTABLE CHEST - 1 VIEW  COMPARISON:  Chest radiograph January 22, 2014  FINDINGS: Endotracheal tube tip projects 3.1 cm above the carina. Swan-Ganz  catheter via RIGHT internal jugular venous approach with distal tip projecting in main pulmonary artery. Nasogastric tube side port past the gastroesophageal junction, distal tip not imaged. LEFT lung base chest tube, mediastinal chest tube. No pneumothorax.  Interval median sternotomy for apparent CABG. The cardiac silhouette appears mildly enlarged, unchanged. Tortuous mildly calcified aorta. Mild mediastinal widening appears projectional. Low inspiratory examination. Bibasilar strandy densities and slight blunting of the costophrenic angles. Dual lead LEFT cardiac pacemaker in situ. Soft tissue planes and included osseous structures are nonsuspicious.  IMPRESSION: Status post median sternotomy. Mild mediastinal widening may be projectional. Trace pleural effusions and bibasilar atelectasis.  Well positioned life support lines as described above. No pneumothorax.  Stable cardiomegaly.   Electronically Signed   By: Elon Alas   On: 01/18/2014 17:12   Dg Chest Port 1 View  01/06/2014   CLINICAL DATA:  Shortness of breath.  EXAM: PORTABLE CHEST - 1 VIEW  COMPARISON:  PA and lateral chest 02/13/2007. Single view of the chest 07/05/2009.  FINDINGS: Pacing device remains in place. Lungs are clear. Heart size is upper normal. No pneumothorax or pleural effusion  IMPRESSION: No acute disease.    Electronically Signed   By: Inge Rise M.D.   On: 01/06/2014 13:30   Assessment:   1. Coronary artery disease s/p CABG/Maze10/16  2. Perioperative NSTEMI  3. A/c systolic HF with cardiogenic shock  4. ICM EF 30-35% (preop)  5. [name] now NSR s/p Maze  6. Acute on CKD, stage III - creatinine bumped to 3.1 today Plan/Discussion:   He remains quite tenuous post-op s/p large peri-operative MI. He is markedly volume overloaded. PAD running around 1mmHg.  Continue lasix gtt at 10.  Continue Milrinone and Levo.  No b-blocker due to shock. Co-ox improving and now 53. CVTS plans on d/c of swan today and will follow CO-ox. Will get echo tomorrow.  Had afib last night and rebolused with Amio. 12 lead this am ? Rhythm - there appears to be p waves intermittently with occasional v pacing.   Continue amio.   We will follow closely. Appreciate TCTS care.   The patient is critically ill with multiple organ systems failure and requires high complexity decision making for assessment and support, frequent evaluation and titration of therapies, application of advanced monitoring technologies and extensive interpretation of multiple databases.    Francisco Margarita, MD  01/30/2014  10:10 AM

## 2014-01-30 NOTE — Op Note (Signed)
NAMEGARNIE, BORCHARDT NO.:  [id number]  MEDICAL RECORD NO.:  [id number]  LOCATION:  2S12C                        FACILITY:  [facility]  PHYSICIAN:  Ivin Poot, M.D.  DATE OF BIRTH:  30-Sep-1929  DATE OF PROCEDURE:  01/30/2014 DATE OF DISCHARGE:                              OPERATIVE REPORT   OPERATION:  Placement of central venous catheter for IV administration of vasoactive drugs.  SURGEON:  Ivin Poot, M.D.  PREOPERATIVE DIAGNOSES:  Acute renal failure following coronary artery bypass grafting, history of congestive heart failure.  POSTOPERATIVE DIAGNOSES:  Acute renal failure following coronary artery bypass grafting, history of congestive heart failure.  ANESTHESIA:  Local 1% lidocaine 10 mL.  PROCEDURE:  The patient is an 80 year old gentleman who recently underwent multivessel CABG with combined Maze procedure. Postoperatively, he developed acute renal failure.  He has required vasoactive ionotropic agents and his pulmonary artery catheter needed to be removed for mobilization of the patient.  A central line placement for continued therapy of his vasoactive drugs was recommended.  I discussed the procedure with the patient including location of procedure, the associated risks of bleeding, pneumothorax, and the expected care of the catheter following insertion.  He understood and agreed to proceed.  OPERATIVE PROCEDURE:  The right shoulder was prepped and draped as a sterile field.  A proper time-out was performed.  Local 1% lidocaine was infiltrated in the distal third of the clavicle.  Using the Seldinger technique, a needle was placed into the right subclavian vein.  Through the needle, a guidewire was passed.  Over the wire, a triple-lumen catheter was passed without difficulty after dilating the tract.  The catheter had good blood return from all 3 ports, which were flushed with sterile saline.  The catheter was then secured to the skin  with silk sutures and a sterile dressing was applied.  A chest x-ray is pending.     Ivin Poot, M.D.     PV/MEDQ  D:  01/30/2014  T:  01/30/2014  Job:  [id number]

## 2014-01-30 NOTE — Progress Notes (Signed)
Pt ECG noted to be fluctuating between Afib, PPM VVI pacing and NSVT; Dr Prescott Gum notified and Pt A.M. Labs, hemodynamics and current IV drip rates all reviewed. Orders received for ABG, additional amio bolus and one amp. NaHCO2, orders carried out and will continue to monitor and observe.

## 2014-01-31 LAB — BASIC METABOLIC PANEL
ANION GAP: 17 — AB (ref 5–15)
BUN: 46 mg/dL — ABNORMAL HIGH (ref 6–23)
CHLORIDE: 95 meq/L — AB (ref 96–112)
CO2: 19 mEq/L (ref 19–32)
Calcium: 8 mg/dL — ABNORMAL LOW (ref 8.4–10.5)
Creatinine, Ser: 3.71 mg/dL — ABNORMAL HIGH (ref 0.50–1.35)
GFR calc Af Amer: 16 mL/min — ABNORMAL LOW (ref >90)
GFR calc non Af Amer: 14 mL/min — ABNORMAL LOW (ref >90)
Glucose, Bld: 97 mg/dL (ref 70–99)
POTASSIUM: 3.9 meq/L (ref 3.7–5.3)
Sodium: 131 mEq/L — ABNORMAL LOW (ref 137–147)

## 2014-01-31 LAB — CBC
HCT: 23.5 % — ABNORMAL LOW (ref 39.0–52.0)
HEMOGLOBIN: 8.1 g/dL — AB (ref 13.0–17.0)
MCH: 30.1 pg (ref 26.0–34.0)
MCHC: 34.5 g/dL (ref 30.0–36.0)
MCV: 87.4 fL (ref 78.0–100.0)
Platelets: 97 10*3/uL — ABNORMAL LOW (ref 150–400)
RBC: 2.69 MIL/uL — ABNORMAL LOW (ref 4.22–5.81)
RDW: 16.9 % — ABNORMAL HIGH (ref 11.5–15.5)
WBC: 17.4 10*3/uL — ABNORMAL HIGH (ref 4.0–10.5)

## 2014-01-31 LAB — CARBOXYHEMOGLOBIN
CARBOXYHEMOGLOBIN: 1.8 % — AB (ref 0.5–1.5)
Methemoglobin: 0.9 % (ref 0.0–1.5)
O2 Saturation: 61 %
TOTAL HEMOGLOBIN: 8 g/dL — AB (ref 13.5–18.0)

## 2014-01-31 LAB — GLUCOSE, CAPILLARY
GLUCOSE-CAPILLARY: 109 mg/dL — AB (ref 70–99)
GLUCOSE-CAPILLARY: 100 mg/dL — AB (ref 70–99)
Glucose-Capillary: 88 mg/dL (ref 70–99)
Glucose-Capillary: 117 mg/dL — ABNORMAL HIGH (ref 70–99)
Glucose-Capillary: 109 mg/dL — ABNORMAL HIGH (ref 70–99)

## 2014-01-31 MED ORDER — SODIUM CHLORIDE 0.9 % IJ SOLN
10.0000 mL | Freq: Two times a day (BID) | INTRAMUSCULAR | Status: DC
  Administered 2014-01-31 – 2014-02-06 (×8): 10 mL

## 2014-01-31 MED ORDER — CARVEDILOL 3.125 MG PO TABS
3.1250 mg | ORAL_TABLET | Freq: Two times a day (BID) | ORAL | Status: DC
  Administered 2014-02-01: 3.125 mg via ORAL
  Filled 2014-01-31 (×4): qty 1

## 2014-01-31 MED ORDER — INSULIN ASPART 100 UNIT/ML ~~LOC~~ SOLN
0.0000 [IU] | Freq: Three times a day (TID) | SUBCUTANEOUS | Status: DC
  Administered 2014-02-02 – 2014-02-03 (×6): 2 [IU] via SUBCUTANEOUS
  Administered 2014-02-04: 13:00:00 via SUBCUTANEOUS
  Administered 2014-02-05 – 2014-02-06 (×3): 1 [IU] via SUBCUTANEOUS

## 2014-01-31 MED ORDER — AMIODARONE IV BOLUS ONLY 150 MG/100ML
150.0000 mg | Freq: Once | INTRAVENOUS | Status: AC
  Administered 2014-01-31: 150 mg via INTRAVENOUS

## 2014-01-31 MED ORDER — METOLAZONE 2.5 MG PO TABS
2.5000 mg | ORAL_TABLET | Freq: Two times a day (BID) | ORAL | Status: DC
Start: 2014-01-31 — End: 2014-02-02
  Administered 2014-01-31 – 2014-02-02 (×6): 2.5 mg via ORAL
  Filled 2014-01-31 (×7): qty 1

## 2014-01-31 MED ORDER — DOPAMINE-DEXTROSE 3.2-5 MG/ML-% IV SOLN
3.0000 ug/kg/min | INTRAVENOUS | Status: DC
Start: 2014-01-31 — End: 2014-02-04
  Administered 2014-02-01 – 2014-02-03 (×2): 3 ug/kg/min via INTRAVENOUS
  Filled 2014-01-31 (×2): qty 250

## 2014-01-31 MED ORDER — SORBITOL 70 % SOLN
30.0000 mL | Freq: Once | Status: AC
  Administered 2014-01-31: 30 mL via ORAL
  Filled 2014-01-31: qty 30

## 2014-01-31 MED ORDER — SORBITOL 70 % SOLN
30.0000 mL | Freq: Every day | Status: DC | PRN
  Filled 2014-01-31: qty 30

## 2014-01-31 MED ORDER — SODIUM CHLORIDE 0.9 % IJ SOLN: 10.0000 mL | INTRAMUSCULAR | Status: DC | PRN

## 2014-01-31 MED ORDER — INSULIN DETEMIR 100 UNIT/ML ~~LOC~~ SOLN
20.0000 [IU] | Freq: Every day | SUBCUTANEOUS | Status: DC
  Filled 2014-01-31: qty 0.2

## 2014-01-31 NOTE — Progress Notes (Signed)
Chart reviewed. Patient markedly volume overloaded. Weight up 40 pounds since CABG.  Creatinine continues to rise. Will increase lasix gtt to 20 and add metolazone.. I suspect he may need short course of CVVHD.   Stephane Junkins,MD 12:15 AM

## 2014-01-31 NOTE — Progress Notes (Signed)
Advanced Heart Failure Rounding Note   Subjective:     Francisco Merritt is a 80 y.o. male with a history of obesity, hypertension, CAD, paroxysmal atrial fibrillation, systolic HF EF 54-00%, tachybradycardia syndrome and is status post pacemaker  Underwent R/L cath 10/7.  Severe 2V CAD.  Underwent CABG and Maze on 10/15. (left internal mammary artery to left anterior  descending, saphenous vein graft to first diagonal and OM1);   Suffered perioperative MI with trop > 20.  On dopa 5. Milrinone 0.25 levophed 14 and amiodarone. This am lasix drip was increased 20 mg per hour and given metolazone. Weight unchanged. (weight remains up 40 pounds).Urine output improved. Feels weak. Denies dyspnea.  CT discontinued this am. Max assist to get out of bed.     Objective:   Weight Range:  Vital Signs:   Temp:  [97.5 F (36.4 C)-99.1 F (37.3 C)] 97.7 F (36.5 C) (10/19 0736) Pulse Rate:  [68-135] 99 (10/19 0900) Resp:  [13-43] 16 (10/19 0900) BP: (72-156)/(44-103) 114/52 mmHg (10/19 0800) SpO2:  [95 %-100 %] 97 % (10/19 0900) Arterial Line BP: (93-132)/(39-63) 110/50 mmHg (10/19 0900) Weight:  [241 lb 2.9 oz (109.4 kg)] 241 lb 2.9 oz (109.4 kg) (10/19 0530) Last BM Date: 01/29/14  Weight change: Filed Weights   01/29/14 0500 01/30/14 0600 01/31/14 0530  Weight: 237 lb 14 oz (107.9 kg) 241 lb 2.9 oz (109.4 kg) 241 lb 2.9 oz (109.4 kg)    Intake/Output:   Intake/Output Summary (Last 24 hours) at 01/31/14 0923 Last data filed at 01/31/14 0900  Gross per 24 hour  Intake 2443.85 ml  Output   1980 ml  Net 463.85 ml     Physical Exam: General:  Elderly. In chair.  HEENT: normal Neck: supple. Swan sleeve . Carotids 2+ bilat; no bruits. No lymphadenopathy or thryomegaly appreciated. Cor: PMI nondisplaced. RRR. +s3 + sternal dressing.  Lungs: decreased BS anteriorly Abdomen: soft, nontender, +distended. No hepatosplenomegaly. No bruits or masses. Good bowel sounds. Extremities: no  cyanosis, clubbing, rash, 3-4+ edema Neuro: alert & orientedx3, cranial nerves grossly intact. moves all 4 extremities w/o difficulty. Affect pleasant  Telemetry:  SR  Labs: Basic Metabolic Panel:  Recent Labs Lab 02/08/2014 2230  01/28/14 0320 01/28/14 1500  01/29/14 0320 01/29/14 1610 01/30/14 0300 01/30/14 1740 01/31/14 0500  NA  --   < > 141  --   < > 138 134* 136* 134* 131*  K  --   < > 4.2  --   < > 4.8 4.7 4.3 4.1 3.9  CL  --   < > 109  --   < > 104 101 102 98 95*  CO2  --   --  21  --   --  18* 17* 17* 19 19  GLUCOSE  --   < > 115*  --   < > 174* 142* 130* 114* 97  BUN  --   < > 18  --   < > 26* 33* 37* 42* 46*  CREATININE 1.12  < > 1.24 1.70*  < > 2.24* 2.76* 3.15* 3.59* 3.71*  CALCIUM  --   --  8.3*  --   --  8.2* 8.3* 8.1* 8.2* 8.0*  MG 3.2*  --  2.9* 2.7*  --   --   --   --   --   --   < > = values in this interval not displayed.  Liver Function Tests:  Recent Labs Lab 02/01/2014 0540  AST 28  ALT 21  ALKPHOS 64  BILITOT 0.8  PROT 7.4  ALBUMIN 3.4*   No results found for this basename: LIPASE, AMYLASE,  in the last 168 hours No results found for this basename: AMMONIA,  in the last 168 hours  CBC:  Recent Labs Lab 01/29/14 0320 01/29/14 1610 01/30/14 0300 01/30/14 1740 01/31/14 0500  WBC 16.3* 17.3* 16.0* 17.5* 17.4*  HGB 8.2* 8.6* 7.9* 8.4* 8.1*  HCT 24.2* 24.8* 23.6* 24.5* 23.5*  MCV 87.4 86.4 89.1 86.9 87.4  PLT 148* 108* 89* 87* 97*    Cardiac Enzymes:  Recent Labs Lab 01/31/2014 0540 01/28/14 0320 01/28/14 1500 01/29/14 0320 01/29/14 1545  CKTOTAL 52 1183* 553* 363* 347*  CKMB 1.7 219.7* 96.6* 37.1* 20.1*  TROPONINI <0.30 >20.00* >20.00* >20.00* >20.00*    BNP: BNP (last 3 results)  Recent Labs  01/06/14 1305 01/30/2014 0540  PROBNP 2171.0* 1919.0*     Other results:    Imaging: Dg Chest Port 1 View  01/30/2014   CLINICAL DATA:  Central line placement.  EXAM: PORTABLE CHEST - 1 VIEW  COMPARISON:  Single view of the  chest 01/30/2014 at 6:04 a.m.  FINDINGS: The patient has a new right subclavian central venous catheter. Tip of the catheter projects in the right atrium. Withdrawal of 4.5 cm is recommended. Support tubes and lines are otherwise unchanged. No pneumothorax is identified. Lung volumes are low with basilar atelectasis. Mild interstitial edema is unchanged.  IMPRESSION: New right subclavian catheter tip projects over the right atrium. Recommend withdrawal of 4.5 cm. Negative for pneumothorax or other change.   Electronically Signed   By: Inge Rise M.D.   On: 01/30/2014 13:26   Dg Chest Port 1 View  01/30/2014   CLINICAL DATA:  Status post CABG x3.  EXAM: PORTABLE CHEST - 1 VIEW  COMPARISON:  01/29/2014.  FINDINGS: There is a left chest wall pacer device with lead in the right atrial appendage and right ventricle. Left Swan-Ganz catheter tip is in the pulmonary artery. Mediastinal drain and left chest tube are in place. Heart size is enlarged. There is mild interstitial edema and small bilateral pleural effusions. No pneumothorax.  IMPRESSION: 1. Changes of mild CHF status post CABG procedure. 2. Left chest tube without pneumothorax.   Electronically Signed   By: Kerby Moors M.D.   On: 01/30/2014 07:48     Medications:     Scheduled Medications: . acetaminophen  1,000 mg Oral 4 times per day   Or  . acetaminophen (TYLENOL) oral liquid 160 mg/5 mL  1,000 mg Per Tube 4 times per day  . antiseptic oral rinse  7 mL Mouth Rinse BID  . aspirin EC  325 mg Oral Daily   Or  . aspirin  324 mg Per Tube Daily  . atorvastatin  40 mg Oral q1800  . bisacodyl  10 mg Oral Daily   Or  . bisacodyl  10 mg Rectal Daily  . Chlorhexidine Gluconate Cloth  6 each Topical Daily  . docusate sodium  200 mg Oral Daily  . insulin aspart  0-24 Units Subcutaneous 6 times per day  . insulin detemir  25 Units Subcutaneous Daily  . metolazone  2.5 mg Oral BID  . metoprolol tartrate  12.5 mg Oral BID   Or  .  metoprolol tartrate  12.5 mg Per Tube BID  . mupirocin ointment  1 application Nasal BID  . pantoprazole  40 mg Oral Daily  . sodium chloride  10-40 mL Intracatheter Q12H  . sodium chloride  3 mL Intravenous Q12H    Infusions: . sodium chloride 10 mL/hr at 01/30/14 1900  . sodium chloride Stopped (01/30/14 1400)  . sodium chloride    . amiodarone 30 mg/hr (01/31/14 0230)  . DOPamine 3 mcg/kg/min (01/31/14 0045)  . furosemide (LASIX) infusion 20 mg/hr (01/31/14 0040)  . lactated ringers Stopped (01/30/14 1400)  . milrinone 0.25 mcg/kg/min (01/31/14 0854)  . norepinephrine (LEVOPHED) Adult infusion 18 mcg/min (01/31/14 0400)    PRN Medications: albumin human, levalbuterol, metoprolol, morphine injection, ondansetron (ZOFRAN) IV, oxyCODONE, sodium chloride, sodium chloride, traMADol   Assessment:   1. Coronary artery disease s/p CABG/Maze10/16 2. Perioperative NSTEMI 3. A/c systolic HF with cardiogenic shock 4. iCM EF 30-35% (preop) 5. [name] now NSR s/p Maze 6. Acute on CKD, stage III   Plan/Discussion:    He remains quite tenuous post-op s/p large peri-operative MI. He is markedly volume overloaded. Continue  lasix gtt to  20 mg and metolazone 2.5 mg twice a day. CVP 16.   NSVT /Afbi RVR over night. Had bolus amio over night. S/P  Maze and amio.   Will need echo in the next day or two.   We will follow closely. Appreciate TCTS care.  The patient is critically ill with multiple organ systems failure and requires high complexity decision making for assessment and support, frequent evaluation and titration of therapies, application of advanced monitoring technologies and extensive interpretation of multiple databases.   Critical Care Time devoted to patient care services described in this note is 35 Minutes.   [name],[name],NP-C  9:23 AM  Patient seen and examined with Darrick Grinder, NP. We discussed all aspects of the encounter. I agree with the assessment and plan as stated  above.   He remains quite tenuous but actually looks better this am. Co-ox ok. Creatinine continues to climb but seems to be leveling out. Will continue lasix gtt and metolazone. If he is not negative I/O overnight will consider CVVHD in am. Back in NSR this am. Continue amio. Will order echo for tomorrow.   Yaviel Kloster,MD 11:03 AM

## 2014-01-31 NOTE — Evaluation (Signed)
Physical Therapy Evaluation Patient Details Name: KOJI NIEHOFF MRN: [id number] DOB: 24-Apr-1929 Today's Date: 01/31/2014   History of Present Illness  Mr Gillison is an 80 year old with a history of obesity, hypertension, Afib, tachybrady syndrome, s/p pacemaker and currently s/p CABG with complication of periop MI and acute renal failure.  Clinical Impression  Pt pleasant preferring to keep eyes closed due to being tired. Pt unaware of precautions and educated for these with report that wife cannot physically assist significantly at D/C and would benefit from ST-SNF at D/C. Pt with decreased strength, function, mobility, decreased problem solving and transfers who will benefit from acute therapy to maximize mobility, function, and independence to decrease burden of care. Pt with leaking during transfer from recently removed chest tube site with RN redressing end of session. Will continue to follow.    Follow Up Recommendations SNF;Supervision/Assistance - 24 hour    Equipment Recommendations  None recommended by PT    Recommendations for Other Services OT consult     Precautions / Restrictions Precautions Precautions: Fall;Sternal      Mobility  Bed Mobility Overal bed mobility: Needs Assistance;+2 for physical assistance Bed Mobility: Rolling;Sidelying to Sit Rolling: Mod assist Sidelying to sit: Mod assist;+2 for physical assistance       General bed mobility comments: cues for sequence, precautions and assist for trunk elevation  Transfers Overall transfer level: Needs assistance   Transfers: Sit to/from Stand;Stand Pivot Transfers Sit to Stand: Mod assist;+2 physical assistance Stand pivot transfers: Max assist;+2 physical assistance       General transfer comment: cues for hand placement, sequence, safety and anterior translation. Pt maintaining flexed posture and unable to fully extend hips and trunk despite assist and cues with max assist to pivot hips to  chair  Ambulation/Gait                Stairs            Wheelchair Mobility    Modified Rankin (Stroke Patients Only)       Balance Overall balance assessment: Needs assistance   Sitting balance-Leahy Scale: Fair       Standing balance-Leahy Scale: Poor                               Pertinent Vitals/Pain Pain Assessment: No/denies pain HR 100-113 BP 121/52 sats 94% on RA    Home Living Family/patient expects to be discharged to:: Private residence Living Arrangements: Spouse/significant other Available Help at Discharge: Family;Available 24 hours/day Type of Home: House Home Access: Ramped entrance     Home Layout: Two level;Able to live on main level with bedroom/bathroom Home Equipment: Gilford Rile - 2 wheels;Cane - single point;Electric scooter;Shower seat - built in;Grab bars - toilet;Grab bars - tub/shower;Hand held shower head;Bedside commode Additional Comments: Pt has dgtr that he and wife care for and another dgtr who works with small kids    Prior Function Level of Independence: Independent               Hand Dominance   Dominant Hand: Right    Extremity/Trunk Assessment   Upper Extremity Assessment: Generalized weakness           Lower Extremity Assessment: Generalized weakness      Cervical / Trunk Assessment: Normal  Communication   Communication: No difficulties  Cognition Arousal/Alertness: Lethargic;Suspect due to medications Behavior During Therapy: [facility] for tasks assessed/performed Overall Cognitive Status: Impaired/Different from  baseline Area of Impairment: Problem solving     Memory: Decreased recall of precautions       Problem Solving: Slow processing;Decreased initiation      General Comments      Exercises        Assessment/Plan    PT Assessment Patient needs continued PT services  PT Diagnosis Difficulty walking;Generalized weakness   PT Problem List Decreased  strength;Decreased range of motion;Decreased activity tolerance;Decreased balance;Decreased mobility;Decreased knowledge of use of DME;Decreased safety awareness;Decreased knowledge of precautions;Cardiopulmonary status limiting activity;Obesity  PT Treatment Interventions DME instruction;Gait training;Stair training;Therapeutic activities;Functional mobility training;Therapeutic exercise;Neuromuscular re-education;Patient/family education   PT Goals (Current goals can be found in the Care Plan section) Acute Rehab PT Goals Patient Stated Goal: to be able to hunt and fish PT Goal Formulation: With patient Time For Goal Achievement: 02/14/14 Potential to Achieve Goals: Fair    Frequency Min 3X/week   Barriers to discharge Decreased caregiver support      Co-evaluation               End of Session Equipment Utilized During Treatment: Gait belt Activity Tolerance: Patient limited by fatigue Patient left: in chair;with call bell/phone within reach;with nursing/sitter in room Nurse Communication: Mobility status;Precautions         Time: 0900-0920 PT Time Calculation (min): 20 min   Charges:   PT Evaluation $Initial PT Evaluation Tier I: 1 Procedure PT Treatments $Therapeutic Activity: 8-22 mins   PT G CodesMelford Aase 01/31/2014, 9:33 AM Elwyn Reach, [place]

## 2014-01-31 NOTE — Progress Notes (Signed)
BP 90/46  Pulse 77  Temp(Src) 98.4 F (36.9 C) (Oral)  Resp 15  Ht 5\' 11"  (1.803 m)  Wt 241 lb 2.9 oz (109.4 kg)  BMI 33.65 kg/m2  SpO2 99%   Intake/Output Summary (Last 24 hours) at 01/31/14 1726 Last data filed at 01/31/14 1600  Gross per 24 hour  Intake 2583.35 ml  Output   2120 ml  Net 463.35 ml    I/O even since midnight  UO has been increasing- hopefully will continue to improve  Did get OOB to chair today

## 2014-01-31 NOTE — Progress Notes (Signed)
Pt noted on monitor to be consistently in rapid Afib with rate of 120-135; BP stable with MAP in the 70's; Dr Prescott Gum contacted and reviewed current hemodynamics and drip rates. Orders received to decrease dopamine to 3 mcg/kg and load with amiodarone 150 mg IV. Will continue to monitor and assess.

## 2014-01-31 NOTE — Progress Notes (Signed)
4 Days Post-Op Procedure(s) (LRB): CORONARY ARTERY BYPASS GRAFTING (CABG), ON PUMP, TIMES THREE, USING LEFT INTERNAL MAMMARY ARTERY, RIGHT GREATER SAPHENOUS VEIN HARVESTED ENDOSCOPICALLY. (N/A) MAZE (N/A) INTRAOPERATIVE TRANSESOPHAGEAL ECHOCARDIOGRAM (N/A) Subjective: Didn't sleep well last night Denies nausea  Objective: Vital signs in last 24 hours: Temp:  [97.5 F (36.4 C)-99.1 F (37.3 C)] 97.7 F (36.5 C) (10/19 0736) Pulse Rate:  [68-135] 94 (10/19 0700) Cardiac Rhythm:  [-] Atrial fibrillation (10/19 0010) Resp:  [13-43] 22 (10/19 0700) BP: (72-156)/(44-103) 107/59 mmHg (10/19 0700) SpO2:  [95 %-100 %] 98 % (10/19 0700) Arterial Line BP: (93-142)/(39-63) 109/54 mmHg (10/19 0700) Weight:  [241 lb 2.9 oz (109.4 kg)] 241 lb 2.9 oz (109.4 kg) (10/19 0530)  Hemodynamic parameters for last 24 hours: PAP: (36-47)/(21-31) 47/31 mmHg CVP:  [16 mmHg-18 mmHg] 16 mmHg  Intake/Output from previous day: 10/18 0701 - 10/19 0700 In: 3268 [P.O.:600; I.V.:2668] Out: 1920 [Urine:1480; Chest Tube:440] Intake/Output this shift:    General appearance: alert and cooperative Neurologic: intact Heart: irregularly irregular rhythm Lungs: diminished breath sounds bibasilar Abdomen: normal findings: soft, non-tender no air leak  Lab Results:  Recent Labs  01/30/14 1740 01/31/14 0500  WBC 17.5* 17.4*  HGB 8.4* 8.1*  HCT 24.5* 23.5*  PLT 87* 97*   BMET:  Recent Labs  01/30/14 1740 01/31/14 0500  NA 134* 131*  K 4.1 3.9  CL 98 95*  CO2 19 19  GLUCOSE 114* 97  BUN 42* 46*  CREATININE 3.59* 3.71*  CALCIUM 8.2* 8.0*    PT/INR: No results found for this basename: LABPROT, INR,  in the last 72 hours ABG    Component Value Date/Time   PHART 7.396 01/30/2014 0518   HCO3 18.3* 01/30/2014 0518   TCO2 19 01/30/2014 0518   ACIDBASEDEF 6.0* 01/30/2014 0518   O2SAT 61.0 01/31/2014 0400   CBG (last 3)   Recent Labs  01/30/14 2348 01/31/14 0358 01/31/14 0734  GLUCAP 103*  100* 88    Assessment/Plan: S/P Procedure(s) (LRB): CORONARY ARTERY BYPASS GRAFTING (CABG), ON PUMP, TIMES THREE, USING LEFT INTERNAL MAMMARY ARTERY, RIGHT GREATER SAPHENOUS VEIN HARVESTED ENDOSCOPICALLY. (N/A) MAZE (N/A) INTRAOPERATIVE TRANSESOPHAGEAL ECHOCARDIOGRAM (N/A) - CV- s/p CABG/ maze with perioperative MI  Acute on chronic systolic heart failure  On milrinone and dopamine(decreased to 3 mcg/kg/min) co-ox 61 and CVP 16 ` Amiodarone for a fib  RESP_ IS for bibasilar atelectasis  CT output trending down- dc  RENAL- UO has picked up over past 24 hours but creatinine still rising, hopefully beginning to plateau  Continue lasix gtt  ENDO- CBG well controlled  Deconditioning- OOB to chair    LOS: 12 days    Nareg Breighner C 01/31/2014

## 2014-02-01 ENCOUNTER — Inpatient Hospital Stay (HOSPITAL_COMMUNITY): Payer: Medicare HMO

## 2014-02-01 ENCOUNTER — Encounter (HOSPITAL_COMMUNITY): Payer: Self-pay | Admitting: Thoracic Surgery (Cardiothoracic Vascular Surgery)

## 2014-02-01 DIAGNOSIS — I517 Cardiomegaly: Secondary | ICD-10-CM

## 2014-02-01 LAB — POCT I-STAT EG7
ACID-BASE DEFICIT: 3 mmol/L — AB (ref 0.0–2.0)
Acid-base deficit: 2 mmol/L (ref 0.0–2.0)
BICARBONATE: 22.5 meq/L (ref 20.0–24.0)
BICARBONATE: 23.4 meq/L (ref 20.0–24.0)
CALCIUM ION: 0.6 mmol/L — AB (ref 1.13–1.30)
Calcium, Ion: 0.56 mmol/L — CL (ref 1.13–1.30)
HCT: 28 % — ABNORMAL LOW (ref 39.0–52.0)
HCT: 28 % — ABNORMAL LOW (ref 39.0–52.0)
Hemoglobin: 9.5 g/dL — ABNORMAL LOW (ref 13.0–17.0)
Hemoglobin: 9.5 g/dL — ABNORMAL LOW (ref 13.0–17.0)
O2 SAT: 44 %
O2 Saturation: 39 %
PCO2 VEN: 41.2 mmHg — AB (ref 45.0–50.0)
POTASSIUM: 3.4 meq/L — AB (ref 3.7–5.3)
Potassium: 3.3 mEq/L — ABNORMAL LOW (ref 3.7–5.3)
SODIUM: 130 meq/L — AB (ref 137–147)
Sodium: 131 mEq/L — ABNORMAL LOW (ref 137–147)
TCO2: 24 mmol/L (ref 0–100)
TCO2: 25 mmol/L (ref 0–100)
pCO2, Ven: 40.9 mmHg — ABNORMAL LOW (ref 45.0–50.0)
pH, Ven: 7.348 — ABNORMAL HIGH (ref 7.250–7.300)
pH, Ven: 7.362 — ABNORMAL HIGH (ref 7.250–7.300)
pO2, Ven: 24 mmHg — CL (ref 30.0–45.0)
pO2, Ven: 25 mmHg — CL (ref 30.0–45.0)

## 2014-02-01 LAB — COMPREHENSIVE METABOLIC PANEL
ALBUMIN: 3.1 g/dL — AB (ref 3.5–5.2)
ALK PHOS: 68 U/L (ref 39–117)
ALT: 495 U/L — ABNORMAL HIGH (ref 0–53)
ANION GAP: 18 — AB (ref 5–15)
AST: 182 U/L — ABNORMAL HIGH (ref 0–37)
BUN: 55 mg/dL — ABNORMAL HIGH (ref 6–23)
CHLORIDE: 93 meq/L — AB (ref 96–112)
CO2: 20 mEq/L (ref 19–32)
CREATININE: 4.04 mg/dL — AB (ref 0.50–1.35)
Calcium: 8 mg/dL — ABNORMAL LOW (ref 8.4–10.5)
GFR calc Af Amer: 15 mL/min — ABNORMAL LOW (ref >90)
GFR calc non Af Amer: 13 mL/min — ABNORMAL LOW (ref >90)
Glucose, Bld: 76 mg/dL (ref 70–99)
POTASSIUM: 3.5 meq/L — AB (ref 3.7–5.3)
Sodium: 131 mEq/L — ABNORMAL LOW (ref 137–147)
Total Bilirubin: 1 mg/dL (ref 0.3–1.2)
Total Protein: 5.7 g/dL — ABNORMAL LOW (ref 6.0–8.3)

## 2014-02-01 LAB — CLOSTRIDIUM DIFFICILE BY PCR: Toxigenic C. Difficile by PCR: NEGATIVE

## 2014-02-01 LAB — GLUCOSE, CAPILLARY
GLUCOSE-CAPILLARY: 65 mg/dL — AB (ref 70–99)
GLUCOSE-CAPILLARY: 74 mg/dL (ref 70–99)
GLUCOSE-CAPILLARY: 114 mg/dL — AB (ref 70–99)
Glucose-Capillary: 65 mg/dL — ABNORMAL LOW (ref 70–99)
Glucose-Capillary: 75 mg/dL (ref 70–99)
Glucose-Capillary: 75 mg/dL (ref 70–99)
Glucose-Capillary: 83 mg/dL (ref 70–99)
Glucose-Capillary: 73 mg/dL (ref 70–99)
Glucose-Capillary: 111 mg/dL — ABNORMAL HIGH (ref 70–99)

## 2014-02-01 LAB — CBC
HCT: 23.2 % — ABNORMAL LOW (ref 39.0–52.0)
Hemoglobin: 8.1 g/dL — ABNORMAL LOW (ref 13.0–17.0)
MCH: 30.6 pg (ref 26.0–34.0)
MCHC: 34.9 g/dL (ref 30.0–36.0)
MCV: 87.5 fL (ref 78.0–100.0)
Platelets: 118 10*3/uL — ABNORMAL LOW (ref 150–400)
RBC: 2.65 MIL/uL — ABNORMAL LOW (ref 4.22–5.81)
RDW: 16.5 % — AB (ref 11.5–15.5)
WBC: 17.4 10*3/uL — AB (ref 4.0–10.5)

## 2014-02-01 LAB — CARBOXYHEMOGLOBIN
CARBOXYHEMOGLOBIN: 1.8 % — AB (ref 0.5–1.5)
METHEMOGLOBIN: 0.4 % (ref 0.0–1.5)
O2 SAT: 44.1 %
Total hemoglobin: 10.6 g/dL — ABNORMAL LOW (ref 13.5–18.0)

## 2014-02-01 MED ORDER — HEPARIN SODIUM (PORCINE) 1000 UNIT/ML IJ SOLN
3000.0000 [IU] | Freq: Once | INTRAMUSCULAR | Status: DC
  Administered 2014-02-01: 2.4 mL via INTRAVENOUS

## 2014-02-01 MED ORDER — HEPARIN SODIUM (PORCINE) 1000 UNIT/ML DIALYSIS
1000.0000 [IU] | INTRAMUSCULAR | Status: DC | PRN
  Filled 2014-02-01: qty 6

## 2014-02-01 MED ORDER — PERFLUTREN LIPID MICROSPHERE
1.0000 mL | INTRAVENOUS | Status: AC | PRN
  Administered 2014-02-01: 2 mL via INTRAVENOUS
  Filled 2014-02-01: qty 10

## 2014-02-01 MED ORDER — SODIUM CHLORIDE 0.9 % FOR CRRT
INTRAVENOUS_CENTRAL | Status: DC | PRN
  Filled 2014-02-01: qty 1000

## 2014-02-01 MED ORDER — POTASSIUM CHLORIDE CRYS ER 20 MEQ PO TBCR
20.0000 meq | EXTENDED_RELEASE_TABLET | Freq: Once | ORAL | Status: AC
  Administered 2014-02-01: 20 meq via ORAL
  Filled 2014-02-01: qty 1

## 2014-02-01 MED ORDER — DEXTROSE 5 % IV SOLN
20.0000 g | INTRAVENOUS | Status: DC
  Administered 2014-02-01 – 2014-02-03 (×3): 20 g via INTRAVENOUS_CENTRAL
  Filled 2014-02-01 (×5): qty 200

## 2014-02-01 MED ORDER — PRISMASOL BGK 4/2.5 32-4-2.5 MEQ/L IV SOLN
INTRAVENOUS | Status: DC
  Administered 2014-02-01 – 2014-02-05 (×6): via INTRAVENOUS_CENTRAL
  Filled 2014-02-01 (×7): qty 5000

## 2014-02-01 MED ORDER — PRISMASOL BGK 4/2.5 32-4-2.5 MEQ/L IV SOLN
INTRAVENOUS | Status: DC
  Administered 2014-02-01 – 2014-02-06 (×21): via INTRAVENOUS_CENTRAL
  Filled 2014-02-01 (×25): qty 5000

## 2014-02-01 MED ORDER — HEPARIN SODIUM (PORCINE) 1000 UNIT/ML DIALYSIS
3000.0000 [IU] | Freq: Once | INTRAMUSCULAR | Status: AC
  Filled 2014-02-01: qty 3

## 2014-02-01 MED ORDER — DEXTROSE 5 % IV SOLN
INTRAVENOUS | Status: DC
  Administered 2014-02-01 – 2014-02-03 (×5): via INTRAVENOUS_CENTRAL
  Filled 2014-02-01 (×9): qty 1500

## 2014-02-01 NOTE — Progress Notes (Signed)
Echocardiogram 2D Echocardiogram with Definity has been performed.  Francisco Merritt 02/01/2014, 11:33 AM

## 2014-02-01 NOTE — Progress Notes (Signed)
5 Days Post-Op Procedure(s) (LRB): CORONARY ARTERY BYPASS GRAFTING (CABG), ON PUMP, TIMES THREE, USING LEFT INTERNAL MAMMARY ARTERY, RIGHT GREATER SAPHENOUS VEIN HARVESTED ENDOSCOPICALLY. (N/A) MAZE (N/A) INTRAOPERATIVE TRANSESOPHAGEAL ECHOCARDIOGRAM (N/A) Subjective: Confused overnight He is lethargic this AM  Objective: Vital signs in last 24 hours: Temp:  [97.7 F (36.5 C)-98.7 F (37.1 C)] 97.7 F (36.5 C) (10/20 0700) Pulse Rate:  [70-119] 76 (10/20 0700) Cardiac Rhythm:  [-] Normal sinus rhythm;Ventricular paced (10/20 0700) Resp:  [14-30] 20 (10/20 0700) BP: (86-123)/(41-67) 91/52 mmHg (10/20 0700) SpO2:  [93 %-100 %] 99 % (10/20 0700) Arterial Line BP: (92-119)/(42-80) 104/61 mmHg (10/20 0700) Weight:  [242 lb 15.2 oz (110.2 kg)] 242 lb 15.2 oz (110.2 kg) (10/20 0500)  Hemodynamic parameters for last 24 hours: CVP:  [15 mmHg-18 mmHg] 16 mmHg  Intake/Output from previous day: 10/19 0701 - 10/20 0700 In: 2321.2 [P.O.:380; I.V.:1941.2] Out: 1700 [Urine:1700] Intake/Output this shift:    General appearance: letahrgic Heart: regular rate and rhythm Lungs: diminished breath sounds bibasilar Abdomen: normal findings: distended + BS Extremities: edema 3-4 + Wound: draiange from left chest tube site  Lab Results:  Recent Labs  01/31/14 0500 02/01/14 0335  WBC 17.4* 17.4*  HGB 8.1* 8.1*  HCT 23.5* 23.2*  PLT 97* 118*   BMET:  Recent Labs  01/31/14 0500 02/01/14 0335  NA 131* 131*  K 3.9 3.5*  CL 95* 93*  CO2 19 20  GLUCOSE 97 76  BUN 46* 55*  CREATININE 3.71* 4.04*  CALCIUM 8.0* 8.0*    PT/INR: No results found for this basename: LABPROT, INR,  in the last 72 hours ABG    Component Value Date/Time   PHART 7.396 01/30/2014 0518   HCO3 18.3* 01/30/2014 0518   TCO2 19 01/30/2014 0518   ACIDBASEDEF 6.0* 01/30/2014 0518   O2SAT 44.1 02/01/2014 0525   CBG (last 3)   Recent Labs  01/31/14 2335 02/01/14 0340 02/01/14 0738  GLUCAP 75 83 73     Assessment/Plan: S/P Procedure(s) (LRB): CORONARY ARTERY BYPASS GRAFTING (CABG), ON PUMP, TIMES THREE, USING LEFT INTERNAL MAMMARY ARTERY, RIGHT GREATER SAPHENOUS VEIN HARVESTED ENDOSCOPICALLY. (N/A) MAZE (N/A) INTRAOPERATIVE TRANSESOPHAGEAL ECHOCARDIOGRAM (N/A) - Condition has deteriorated overnight His co-ox is down and creatinine up Still very edematous UO decreased overnight Agree with Dr. Clayborne Dana plan to start CVVHD Continue milrinone and dopamine, wean levophed as tolerated Check echo   LOS: 13 days    Glenola Wheat C 02/01/2014

## 2014-02-01 NOTE — Procedures (Signed)
Trialysis Catheter Insertion Procedure Note ADVAIT BUICE [id number] 12-08-1929  Procedure: Insertion of Trialysis Catheter Indications: Hemodialysis  Procedure Details Consent: Risks of procedure as well as the alternatives and risks of each were explained to the (patient/caregiver).  Consent for procedure obtained. Time Out: Verified patient identification, verified procedure, site/side was marked, verified correct patient position, special equipment/implants available, medications/allergies/relevent history reviewed, required imaging and test results available.  Performed  Maximum sterile technique was used including antiseptics, cap, gloves, gown, hand hygiene, mask and sheet. Skin prep: Chlorhexidine; local anesthetic administered  The pre-existing venous sheath in the patient's RIJ and the surrounding neck area were prepped and draped extensively in a sterile fashion. I removed the pre-existing sheath over a wire and then removed my outer pair of sterile gloves. Serial dilations were then made and a trialysis catheter was placed in the right internal jugular vein using a modified-Seldinger technique.  Evaluation Blood flow poor Complications: No apparent complications Patient did tolerate procedure well. Chest X-ray ordered to verify placement.  CXR: pending.  Glori Bickers MD 02/01/2014, 5:07 PM

## 2014-02-01 NOTE — Progress Notes (Addendum)
Advanced Heart Failure Rounding Note   Subjective:     Francisco Merritt is a 80 y.o. male with a history of obesity, hypertension, CAD, paroxysmal atrial fibrillation, systolic HF EF 43-32%, tachybradycardia syndrome and is status post pacemaker  Underwent R/L cath 10/7.  Severe 2V CAD.  Underwent CABG and Maze on 10/15. (left internal mammary artery to left anterior  descending, saphenous vein graft to first diagonal and OM1);   Suffered perioperative MI with trop > 20.  On dopa 5. Milrinone 0.25 levophed 10 and amiodarone. Lasix drip was at 20 mg per hour withmetolazone. I/Os remain postivie. (weight remains up 40 pounds).Urine output 50-70cc/hr. Feels weak. Denies dyspnea. Cr up to 4.0 Co-ox 44%    Objective:   Weight Range:  Vital Signs:   Temp:  [97.7 F (36.5 C)-98.7 F (37.1 C)] 98.4 F (36.9 C) (10/20 0400) Pulse Rate:  [70-119] 71 (10/20 0400) Resp:  [14-30] 16 (10/20 0400) BP: (86-128)/(41-68) 110/46 mmHg (10/20 0400) SpO2:  [93 %-100 %] 97 % (10/20 0400) Arterial Line BP: (92-132)/(42-80) 115/49 mmHg (10/20 0400) Weight:  [109.4 kg (241 lb 2.9 oz)] 109.4 kg (241 lb 2.9 oz) (10/19 0530) Last BM Date: 01/31/14  Weight change: Filed Weights   01/29/14 0500 01/30/14 0600 01/31/14 0530  Weight: 107.9 kg (237 lb 14 oz) 109.4 kg (241 lb 2.9 oz) 109.4 kg (241 lb 2.9 oz)    Intake/Output:   Intake/Output Summary (Last 24 hours) at 02/01/14 0524 Last data filed at 02/01/14 0400  Gross per 24 hour  Intake 2236.6 ml  Output   1735 ml  Net  501.6 ml     Physical Exam: General:  Elderly. In bed HEENT: normal Neck: supple. Swan sleeve . Carotids 2+ bilat; no bruits. No lymphadenopathy or thryomegaly appreciated. Cor: PMI nondisplaced. RRR. +s3 + sternal dressing.  Lungs: decreased BS anteriorly Abdomen: soft, nontender, +distended. No hepatosplenomegaly. No bruits or masses. Good bowel sounds. Extremities: no cyanosis, clubbing, rash, 3-4+ edema Neuro: alert &  orientedx3, cranial nerves grossly intact. moves all 4 extremities w/o difficulty. Affect pleasant  Telemetry:  SR with occasional v-pacing  Labs: Basic Metabolic Panel:  Recent Labs Lab 02/05/2014 2230  01/28/14 0320 01/28/14 1500  01/29/14 1610 01/30/14 0300 01/30/14 1740 01/31/14 0500 02/01/14 0335  NA  --   < > 141  --   < > 134* 136* 134* 131* 131*  K  --   < > 4.2  --   < > 4.7 4.3 4.1 3.9 3.5*  CL  --   < > 109  --   < > 101 102 98 95* 93*  CO2  --   --  21  --   < > 17* 17* 19 19 20   GLUCOSE  --   < > 115*  --   < > 142* 130* 114* 97 76  BUN  --   < > 18  --   < > 33* 37* 42* 46* 55*  CREATININE 1.12  < > 1.24 1.70*  < > 2.76* 3.15* 3.59* 3.71* 4.04*  CALCIUM  --   --  8.3*  --   < > 8.3* 8.1* 8.2* 8.0* 8.0*  MG 3.2*  --  2.9* 2.7*  --   --   --   --   --   --   < > = values in this interval not displayed.  Liver Function Tests:  Recent Labs Lab 01/18/2014 0540 02/01/14 0335  AST 28 182*  ALT  21 495*  ALKPHOS 64 68  BILITOT 0.8 1.0  PROT 7.4 5.7*  ALBUMIN 3.4* 3.1*   No results found for this basename: LIPASE, AMYLASE,  in the last 168 hours No results found for this basename: AMMONIA,  in the last 168 hours  CBC:  Recent Labs Lab 01/29/14 1610 01/30/14 0300 01/30/14 1740 01/31/14 0500 02/01/14 0335  WBC 17.3* 16.0* 17.5* 17.4* 17.4*  HGB 8.6* 7.9* 8.4* 8.1* 8.1*  HCT 24.8* 23.6* 24.5* 23.5* 23.2*  MCV 86.4 89.1 86.9 87.4 87.5  PLT 108* 89* 87* 97* 118*    Cardiac Enzymes:  Recent Labs Lab 02/03/2014 0540 01/28/14 0320 01/28/14 1500 01/29/14 0320 01/29/14 1545  CKTOTAL 52 1183* 553* 363* 347*  CKMB 1.7 219.7* 96.6* 37.1* 20.1*  TROPONINI <0.30 >20.00* >20.00* >20.00* >20.00*    BNP: BNP (last 3 results)  Recent Labs  01/06/14 1305 02/07/2014 0540  PROBNP 2171.0* 1919.0*     Other results:    Imaging: Dg Chest Port 1 View  01/30/2014   CLINICAL DATA:  Central line placement.  EXAM: PORTABLE CHEST - 1 VIEW  COMPARISON:  Single  view of the chest 01/30/2014 at 6:04 a.m.  FINDINGS: The patient has a new right subclavian central venous catheter. Tip of the catheter projects in the right atrium. Withdrawal of 4.5 cm is recommended. Support tubes and lines are otherwise unchanged. No pneumothorax is identified. Lung volumes are low with basilar atelectasis. Mild interstitial edema is unchanged.  IMPRESSION: New right subclavian catheter tip projects over the right atrium. Recommend withdrawal of 4.5 cm. Negative for pneumothorax or other change.   Electronically Signed   By: Inge Rise M.D.   On: 01/30/2014 13:26   Dg Chest Port 1 View  01/30/2014   CLINICAL DATA:  Status post CABG x3.  EXAM: PORTABLE CHEST - 1 VIEW  COMPARISON:  01/29/2014.  FINDINGS: There is a left chest wall pacer device with lead in the right atrial appendage and right ventricle. Left Swan-Ganz catheter tip is in the pulmonary artery. Mediastinal drain and left chest tube are in place. Heart size is enlarged. There is mild interstitial edema and small bilateral pleural effusions. No pneumothorax.  IMPRESSION: 1. Changes of mild CHF status post CABG procedure. 2. Left chest tube without pneumothorax.   Electronically Signed   By: Kerby Moors M.D.   On: 01/30/2014 07:48     Medications:     Scheduled Medications: . acetaminophen  1,000 mg Oral 4 times per day   Or  . acetaminophen (TYLENOL) oral liquid 160 mg/5 mL  1,000 mg Per Tube 4 times per day  . antiseptic oral rinse  7 mL Mouth Rinse BID  . aspirin EC  325 mg Oral Daily   Or  . aspirin  324 mg Per Tube Daily  . atorvastatin  40 mg Oral q1800  . bisacodyl  10 mg Oral Daily   Or  . bisacodyl  10 mg Rectal Daily  . carvedilol  3.125 mg Oral BID WC  . Chlorhexidine Gluconate Cloth  6 each Topical Daily  . docusate sodium  200 mg Oral Daily  . insulin aspart  0-9 Units Subcutaneous TID WC  . insulin detemir  20 Units Subcutaneous Daily  . metolazone  2.5 mg Oral BID  . mupirocin  ointment  1 application Nasal BID  . pantoprazole  40 mg Oral Daily  . sodium chloride  10-40 mL Intracatheter Q12H  . sodium chloride  3 mL Intravenous Q12H  Infusions: . sodium chloride 10 mL/hr at 02/01/14 0400  . sodium chloride    . amiodarone 30 mg/hr (02/01/14 0400)  . DOPamine 3 mcg/kg/min (02/01/14 0400)  . furosemide (LASIX) infusion 20 mg/hr (02/01/14 0400)  . lactated ringers Stopped (01/30/14 1400)  . milrinone 0.25 mcg/kg/min (02/01/14 0400)  . norepinephrine (LEVOPHED) Adult infusion 10 mcg/min (02/01/14 0400)    PRN Medications: levalbuterol, metoprolol, morphine injection, ondansetron (ZOFRAN) IV, oxyCODONE, sodium chloride, sodium chloride, sorbitol, traMADol   Assessment:   1. Coronary artery disease s/p CABG/Maze10/16 2. Perioperative NSTEMI 3. A/c systolic HF with cardiogenic shock 4. iCM EF 30-35% (preop) 5. [name] now NSR s/p Maze 6. Acute on CKD, stage IV   Plan/Discussion:     I/Os continue to be positive despite high-dose diuretics. Renal function worse. Co-ox down. I think we need to contact Renal to start CVVHD today. Will d/w Dr. Roxan Hockey. If needed, can switch out Swan sheath for Trialysis catheter. Continue inotrope support - hopefull cardiac output will improve with volume removal.    Back in NSR. Continue amio. Will get echo today. Wean inotropes as tolerated.  Daniel Bensimhon,MD 5:24 AM

## 2014-02-01 NOTE — Progress Notes (Signed)
Patient ID: Francisco Merritt, male   DOB: Jul 17, 1929, 80 y.o.   MRN: [id number]  SICU Evening Rounds:  Hemodynamics fairly stable on dop 3, Milrinone 0.25, and levophed 7.  Co-ox on 44.1 this am with rising creat and moderate urine output of 100/hr on lasix 20/hr.  Seen by Dr. Haroldine Laws and HD catheter placed for CRRT to remove 40 lbs of excess volume.  2D echo done today but it was apparently a poor study. LV function looks decreased but how much is unclear.

## 2014-02-01 NOTE — Consult Note (Signed)
Reason for Consult: Acute on CKD, fluid overload Referring Physician: Dr. Fredderick Phenix is an 80 y.o. male.  HPI: Pt is a 80 y.o. male with PMH of CKD (baseline 1.3-1.5), CAD, [name], CHF EF 30-35%, tachybradycardia syndrome s/p pacer, HTN, obesity underwent CABG and MAZE on 10/15, suffered periop MI and now requiring dopa, milrinone, levophed. Despite lasix gtt with metolazone he has been net positive for fluids and is currently +20kg. His Scr has trended up to 4.04, and we have been consulted for possible CVVHD. He currently says he feels okay, daughter at bedside says he is doing better this afternoon, had soup/first food in several days. Denies CP, SOB, denies any pain. He does not follow with a nephrologist.   Trend in Creatinine: Creatinine, Ser  Date/Time Value Ref Range Status  02/01/2014  3:35 AM 4.04* 0.50 - 1.35 mg/dL Final  01/31/2014  5:00 AM 3.71* 0.50 - 1.35 mg/dL Final  01/30/2014  5:40 PM 3.59* 0.50 - 1.35 mg/dL Final  01/30/2014  3:00 AM 3.15* 0.50 - 1.35 mg/dL Final  01/29/2014  4:10 PM 2.76* 0.50 - 1.35 mg/dL Final  01/29/2014  3:20 AM 2.24* 0.50 - 1.35 mg/dL Final  01/28/2014  3:08 PM 1.80* 0.50 - 1.35 mg/dL Final  01/28/2014  3:00 PM 1.70* 0.50 - 1.35 mg/dL Final  01/28/2014  3:20 AM 1.24  0.50 - 1.35 mg/dL Final  02/08/2014 10:33 PM 1.10  0.50 - 1.35 mg/dL Final  02/05/2014 10:30 PM 1.12  0.50 - 1.35 mg/dL Final  01/30/2014  3:22 PM 1.00  0.50 - 1.35 mg/dL Final  02/04/2014  2:53 PM 1.00  0.50 - 1.35 mg/dL Final  02/01/2014  2:26 PM 1.00  0.50 - 1.35 mg/dL Final  01/23/2014  1:30 PM 1.10  0.50 - 1.35 mg/dL Final  02/09/2014 12:17 PM 1.20  0.50 - 1.35 mg/dL Final  02/07/2014 10:22 AM 1.10  0.50 - 1.35 mg/dL Final  01/22/2014  8:34 AM 1.10  0.50 - 1.35 mg/dL Final  02/12/2014  5:40 AM 1.52* 0.50 - 1.35 mg/dL Final  01/26/2014  4:36 AM 1.55* 0.50 - 1.35 mg/dL Final  01/25/2014  9:10 AM 1.54* 0.50 - 1.35 mg/dL Final  01/31/2014  1:44 AM 1.96* 0.50 - 1.35  mg/dL Final  01/23/2014 11:22 AM 1.71* 0.50 - 1.35 mg/dL Final  01/22/2014 12:03 PM 1.75* 0.50 - 1.35 mg/dL Final  01/21/2014  5:23 PM 1.61* 0.50 - 1.35 mg/dL Final  01/20/2014  3:58 AM 1.43* 0.50 - 1.35 mg/dL Final  01/18/2014  7:47 PM 1.41* 0.50 - 1.35 mg/dL Final  01/09/2014  4:18 AM 1.28  0.50 - 1.35 mg/dL Final  01/07/2014  1:55 AM 1.27  0.50 - 1.35 mg/dL Final  01/06/2014  1:05 PM 1.38* 0.50 - 1.35 mg/dL Final  03/05/2013 10:41 AM 1.5  0.4 - 1.5 mg/dL Final  02/24/2012 10:35 AM 1.2  0.4 - 1.5 mg/dL Final  05/18/2010 10:21 AM 1.3  0.4 - 1.5 mg/dL Final  07/08/2009  5:50 AM 1.46  0.4 - 1.5 mg/dL Final  07/07/2009  4:40 AM 1.60* 0.4 - 1.5 mg/dL Final  07/06/2009  4:45 AM 1.35  0.4 - 1.5 mg/dL Final  07/05/2009 10:10 AM 1.3  0.4 - 1.5 mg/dL Final  04/05/2009  9:59 AM 1.4  0.4-1.5 mg/dL Final  09/06/2008 11:01 AM 1.3  0.4-1.5 mg/dL Final  03/31/2007 10:17 AM 1.3  0.4-1.5 mg/dL Final  02/05/2007  1:20 PM 1.29   Final  12/22/2006 11:22 AM 1.2  0.4-1.5  mg/dL Final  12/02/2006  4:30 AM 1.16   Final  12/01/2006  4:25 AM 1.18   Final  11/30/2006  1:01 AM 1.18   Final  11/29/2006 12:35 PM 1.26   Final  11/28/2006  2:58 PM 1.34   Final  11/24/2006  9:20 AM 1.5  0.4-1.5 mg/dL Final  08/18/2006 12:00 AM 1.4  0.4-1.5 mg/dL Final    PMH:   Past Medical History  Diagnosis Date  . Paroxysmal atrial fibrillation     s/p [employer] Insignia pacemaker; started sotalol 06/2009  . Tachy-brady syndrome   . Coronary artery disease     one vessel CAD with normal EF; Cath 12/2006. Ef normal. LM nl, LAD 40%, D2 occulded with collaterals, LCX 40%, RCA 30%; Echo 02/2009 EF 08-65% grade I diastolic dysfunction.  Marland Kitchen History of gallstones     status post cholecystectomy  . Hypertension   . Hyperlipidemia   . History of peptic ulcer disease   . Benign prostatic hypertrophy   . Obesity   . Diverticulosis of colon   . Testosterone deficiency   . ED (erectile dysfunction)   . Atrial fibrillation   . Nephrolithiasis     "passed  it"  . Osteoarthritis     "legs and lower back" (01/24/2014)  . Chronic lower back pain     PSH:   Past Surgical History  Procedure Laterality Date  . Insert / replace / remove pacemaker  2008  . Colonoscopy  1999  . Upper gastroduodenoscopy  1999  . Doppler evaluation    . Cardiac catheterization  ~ 2008; 01/22/2014    ; "unable to put stents in"  . Cholecystectomy  ~ 2008  . Cataract extraction, bilateral Bilateral   . Coronary artery bypass graft N/A 01/15/2014    Procedure: CORONARY ARTERY BYPASS GRAFTING (CABG), ON PUMP, TIMES THREE, USING LEFT INTERNAL MAMMARY ARTERY, RIGHT GREATER SAPHENOUS VEIN HARVESTED ENDOSCOPICALLY.;  Surgeon: Melrose Nakayama, MD;  Location: [facility];  Service: Open Heart Surgery;  Laterality: N/A;  . Maze N/A 01/13/2014    Procedure: MAZE;  Surgeon: Melrose Nakayama, MD;  Location: [facility];  Service: Open Heart Surgery;  Laterality: N/A;  . Intraoperative transesophageal echocardiogram N/A 01/29/2014    Procedure: INTRAOPERATIVE TRANSESOPHAGEAL ECHOCARDIOGRAM;  Surgeon: Melrose Nakayama, MD;  Location: [facility];  Service: Open Heart Surgery;  Laterality: N/A;    Allergies:  Allergies  Allergen Reactions  . Sulfonamide Derivatives     REACTION: n/v    Medications:   Prior to Admission medications   Medication Sig Start Date End Date Taking? Authorizing Provider  acetaminophen (TYLENOL ARTHRITIS PAIN) 650 MG CR tablet Take 650 mg by mouth 2 (two) times daily.    Yes Historical Provider, MD  aspirin 81 MG tablet Take 81 mg by mouth daily.     Yes Historical Provider, MD  lisinopril (PRINIVIL,ZESTRIL) 2.5 MG tablet Take 1 tablet (2.5 mg total) by mouth daily. 01/09/14  Yes Barton Dubois, MD  metoprolol (LOPRESSOR) 50 MG tablet Take 1 tablet (50 mg total) by mouth 2 (two) times daily. 01/09/14  Yes Barton Dubois, MD  Multiple Vitamin (MULTIVITAMIN) tablet Take 1 tablet by mouth daily.     Yes Historical Provider, MD  nitroGLYCERIN (NITROSTAT) 0.4 MG  SL tablet Place 1 tablet (0.4 mg total) under the tongue every 5 (five) minutes as needed. 08/28/10  Yes Marletta Lor, MD  pantoprazole (PROTONIX) 40 MG tablet Take 1 tablet (40 mg total) by mouth daily. 01/09/14  Yes Barton Dubois, MD  simvastatin (ZOCOR) 20 MG tablet Take 20 mg by mouth at bedtime.   Yes Historical Provider, MD  tamsulosin (FLOMAX) 0.4 MG CAPS capsule Take 0.4 mg by mouth daily after supper.   Yes Historical Provider, MD  traMADol (ULTRAM) 50 MG tablet Take 50 mg by mouth every 6 (six) hours as needed (for pain).   Yes Historical Provider, MD  warfarin (COUMADIN) 5 MG tablet Take 2.5-5 mg by mouth every morning. Take 2.5 mg daily except 5 mg on sundays   Yes Historical Provider, MD  simvastatin (ZOCOR) 20 MG tablet TAKE 1 TABLET BY MOUTH AT BEDTIME 01/17/14   Marletta Lor, MD    Inpatient medications: . acetaminophen  1,000 mg Oral 4 times per day   Or  . acetaminophen (TYLENOL) oral liquid 160 mg/5 mL  1,000 mg Per Tube 4 times per day  . antiseptic oral rinse  7 mL Mouth Rinse BID  . aspirin EC  325 mg Oral Daily   Or  . aspirin  324 mg Per Tube Daily  . atorvastatin  40 mg Oral q1800  . bisacodyl  10 mg Oral Daily   Or  . bisacodyl  10 mg Rectal Daily  . docusate sodium  200 mg Oral Daily  . insulin aspart  0-9 Units Subcutaneous TID WC  . metolazone  2.5 mg Oral BID  . pantoprazole  40 mg Oral Daily  . sodium chloride  10-40 mL Intracatheter Q12H  . sodium chloride  3 mL Intravenous Q12H    Discontinued Meds:   Medications Discontinued During This Encounter  Medication Reason  . glucosamine-chondroitin 500-400 MG tablet Patient has not taken in last 30 days  . 0.9 % NaCl with KCl 20 mEq/ L  infusion   . sodium chloride 0.9 % injection 3 mL Patient Transfer  . sodium chloride 0.9 % injection 3 mL Patient Transfer  . 0.9 %  sodium chloride infusion Patient Transfer  . 0.9 %  sodium chloride infusion Patient Transfer  . ondansetron (ZOFRAN)  injection 4 mg   . aspirin tablet 81 mg Formulary change  . multivitamin tablet 1 tablet Formulary change  . heparin ADULT infusion 100 units/mL (25000 units/250 mL)   . heparin ADULT infusion 100 units/mL (25000 units/250 mL)   . heparin ADULT infusion 100 units/mL (25000 units/250 mL)   . furosemide (LASIX) tablet 40 mg   . aspirin EC tablet 81 mg Duplicate  . amiodarone (PACERONE) tablet 557 mg Duplicate  . atorvastatin (LIPITOR) tablet 40 mg Duplicate  . carvedilol (COREG) tablet 3.22 mg Duplicate  . heparin injection 0,254 Units Duplicate  . isosorbide mononitrate (IMDUR) 24 hr tablet 30 mg Duplicate  . multivitamin with minerals tablet 1 tablet Duplicate  . pantoprazole (PROTONIX) EC tablet 40 mg Duplicate  . tamsulosin (FLOMAX) capsule 0.4 mg Duplicate  . furosemide (LASIX) injection 40 mg One time medication  . ALPRAZolam (XANAX) tablet 0.25 mg Dose change  . 0.9 %  sodium chloride infusion Duplicate  . acetaminophen (TYLENOL) tablet 270 mg Duplicate  . traMADol (ULTRAM) tablet 50 mg Duplicate  . nitroGLYCERIN (NITROSTAT) SL tablet 0.4 mg Duplicate  . ondansetron (ZOFRAN) injection 4 mg Duplicate  . oxyCODONE-acetaminophen (PERCOCET/ROXICET) 5-325 MG per tablet 1-2 tablet Duplicate  . 0.9 % irrigation (POUR BTL) Patient Discharge  . Surgifoam 1 Gm with 0.9% sodium chloride (4 ml) topical solution Patient Discharge  . hemostatic agents Patient Discharge  . EPINEPHrine (ADRENALIN) 4 mg in  dextrose 5 % 250 mL (0.016 mg/mL) infusion Patient Transfer  . nitroGLYCERIN 50 mg in dextrose 5 % 250 mL (0.2 mg/mL) infusion Patient Transfer  . phenylephrine (NEO-SYNEPHRINE) 20 mg in dextrose 5 % 250 mL (0.08 mg/mL) infusion Patient Transfer  . heparin 30,000 units/NS 1000 mL solution for CELLSAVER Patient Transfer  . potassium chloride injection 80 mEq Patient Transfer  . magnesium sulfate (IV Push/IM) injection 40 mEq Patient Transfer  . cefUROXime (ZINACEF) 750 mg in dextrose 5 % 50 mL  IVPB Patient Transfer  . 0.9 %  sodium chloride infusion Patient Transfer  . 0.9 %  sodium chloride infusion Patient Transfer  . acetaminophen (TYLENOL) tablet 650 mg Patient Transfer  . ALPRAZolam (XANAX) tablet 0.25-0.5 mg Patient Transfer  . aminocaproic acid (AMICAR) 5 g in sodium chloride 0.9 % 50 mL infusion Patient Transfer  . amiodarone (PACERONE) tablet 400 mg Patient Transfer  . aspirin EC tablet 81 mg Patient Transfer  . carvedilol (COREG) tablet 6.25 mg Patient Transfer  . dexmedetomidine (PRECEDEX) 400 MCG/100ML (4 mcg/mL) infusion Patient Transfer  . isosorbide mononitrate (IMDUR) 24 hr tablet 30 mg Patient Transfer  . multivitamin with minerals tablet 1 tablet Patient Transfer  . nitroGLYCERIN (NITROSTAT) SL tablet 0.4 mg Patient Transfer  . ondansetron (ZOFRAN) injection 4 mg Patient Transfer  . oxyCODONE-acetaminophen (PERCOCET/ROXICET) 5-325 MG per tablet 1-2 tablet Patient Transfer  . pantoprazole (PROTONIX) EC tablet 40 mg Patient Transfer  . Placebo / Levosimendan infusion (25mg  / 550ml) Patient Transfer  . Placebo / Levosimendan infusion (25mg  / 532ml) Patient Transfer  . traMADol (ULTRAM) tablet 50 mg Patient Transfer  . sodium bicarbonate 4.2 % injection 50 mEq   . norepinephrine (LEVOPHED) 4 mg in dextrose 5 % 250 mL (0.016 mg/mL) infusion   . levalbuterol (XOPENEX) nebulizer solution 0.63 mg   . insulin regular (NOVOLIN R,HUMULIN R) 250 Units in sodium chloride 0.9 % 250 mL (1 Units/mL) infusion   . insulin regular bolus via infusion 0-10 Units   . chlorhexidine (PERIDEX) 0.12 % solution 15 mL   . antiseptic oral rinse (CPC / CETYLPYRIDINIUM CHLORIDE 0.05%) solution 7 mL   . dexmedetomidine (PRECEDEX) 200 MCG/50ML (4 mcg/mL) infusion   . nitroGLYCERIN 50 mg in dextrose 5 % 250 mL (0.2 mg/mL) infusion   . morphine 2 MG/ML injection 2-5 mg   . midazolam (VERSED) injection 2 mg   . sodium bicarbonate injection 25 mEq   . phenylephrine (NEO-SYNEPHRINE) 20 mg in  dextrose 5 % 250 mL (0.08 mg/mL) infusion   . tamsulosin (FLOMAX) capsule 0.4 mg   . DOPamine (INTROPIN) 800 mg in dextrose 5 % 250 mL (3.2 mg/mL) infusion   . insulin aspart (novoLOG) injection 0-24 Units   . insulin detemir (LEVEMIR) injection 25 Units   . metoprolol tartrate (LOPRESSOR) tablet 12.5 mg   . metoprolol tartrate (LOPRESSOR) 25 mg/10 mL oral suspension 12.5 mg   . 0.9 %  sodium chloride infusion   . albumin human 5 % solution 12.5 g   . insulin detemir (LEVEMIR) injection 20 Units   . carvedilol (COREG) tablet 3.125 mg     Social History:  reports that he has never smoked. He has never used smokeless tobacco. He reports that he does not drink alcohol or use illicit drugs.  Family History:   Family History  Problem Relation Age of Onset  . Hypertension Mother   . Heart disease Mother   . Diabetes Mother   . Heart attack Father   .  Diabetes Brother   . Hypertension Brother   . Heart disease Brother   . Diabetes Brother   . Hypertension Brother   . Heart disease Brother     A comprehensive review of systems was negative. See HPI for additional pertinent ROS. Weight change: 1 lb 12.2 oz (0.8 kg)  Intake/Output Summary (Last 24 hours) at 02/01/14 1604 Last data filed at 02/01/14 1500  Gross per 24 hour  Intake 2048.3 ml  Output   1450 ml  Net  598.3 ml   BP 110/56  Pulse 67  Temp(Src) 97.3 F (36.3 C) (Oral)  Resp 19  Ht 5\' 11"  (1.803 m)  Wt 242 lb 15.2 oz (110.2 kg)  BMI 33.90 kg/m2  SpO2 96% Filed Vitals:   02/01/14 1200 02/01/14 1300 02/01/14 1400 02/01/14 1500  BP: 100/44 107/55 97/49 110/56  Pulse: 67 68 69 67  Temp:    97.3 F (36.3 C)  TempSrc:    Oral  Resp: 18 14 20 19   Height:      Weight:      SpO2: 91% 96% 93% 96%     General: NAD HEENT: Right IJ cath in place CV: RRR. Well healed sternotomy scar.  Resp: clear anteriorly, normal effort Abd: soft, obese, +BS Ext: 3+ edema LE and UE.  Neuro: awake, alert x 2, will follow  commands.  Labs: Basic Metabolic Panel:  Recent Labs Lab 01/20/2014 0540  01/28/14 0320  01/28/14 1508 01/29/14 0320 01/29/14 1610 01/30/14 0300 01/30/14 1740 01/31/14 0500 02/01/14 0335  NA 139  < > 141  --  138 138 134* 136* 134* 131* 131*  K 4.4  < > 4.2  --  4.5 4.8 4.7 4.3 4.1 3.9 3.5*  CL 104  < > 109  --  108 104 101 102 98 95* 93*  CO2 22  --  21  --   --  18* 17* 17* 19 19 20   GLUCOSE 101*  < > 115*  --  152* 174* 142* 130* 114* 97 76  BUN 25*  < > 18  --  20 26* 33* 37* 42* 46* 55*  CREATININE 1.52*  < > 1.24  < > 1.80* 2.24* 2.76* 3.15* 3.59* 3.71* 4.04*  ALBUMIN 3.4*  --   --   --   --   --   --   --   --   --  3.1*  CALCIUM 9.5  --  8.3*  --   --  8.2* 8.3* 8.1* 8.2* 8.0* 8.0*  < > = values in this interval not displayed. Liver Function Tests:  Recent Labs Lab 02/02/2014 0540 02/01/14 0335  AST 28 182*  ALT 21 495*  ALKPHOS 64 68  BILITOT 0.8 1.0  PROT 7.4 5.7*  ALBUMIN 3.4* 3.1*   No results found for this basename: LIPASE, AMYLASE,  in the last 168 hours No results found for this basename: AMMONIA,  in the last 168 hours CBC:  Recent Labs Lab 01/30/14 0300 01/30/14 1740 01/31/14 0500 02/01/14 0335  WBC 16.0* 17.5* 17.4* 17.4*  HGB 7.9* 8.4* 8.1* 8.1*  HCT 23.6* 24.5* 23.5* 23.2*  MCV 89.1 86.9 87.4 87.5  PLT 89* 87* 97* 118*   PT/INR: @LABRCNTIP (inr:5) Cardiac Enzymes: ) Recent Labs Lab 02/12/2014 0540 01/28/14 0320 01/28/14 1500 01/29/14 0320 01/29/14 1545  CKTOTAL 52 1183* 553* 363* 347*  CKMB 1.7 219.7* 96.6* 37.1* 20.1*  TROPONINI <0.30 >20.00* >20.00* >20.00* >20.00*   CBG:  Recent Labs Lab 01/31/14  2241 01/31/14 2335 02/01/14 0340 02/01/14 0738 02/01/14 1127  GLUCAP 75 75 83 73 74    Iron Studies: No results found for this basename: IRON, TIBC, TRANSFERRIN, FERRITIN,  in the last 168 hours  Xrays/Other Studies: Dg Chest Port 1 View  02/01/2014   CLINICAL DATA:  Lung congestion.  Subsequent encounter.  EXAM: PORTABLE  CHEST - 1 VIEW  COMPARISON:  01/30/2014; 01/29/2014  FINDINGS: Grossly unchanged enlarged cardiac silhouette and mediastinal contour post median sternotomy and CABG. Interval removal of left-sided chest tube and mediastinal drain with development of a tiny biapical pneumothoraces. Interval removal of right jugular approach PA catheter with remaining vascular sheath tip projected over the central aspect of the right internal jugular vein. Otherwise, stable position of support apparatus. Pulmonary vasculature is indistinct with cephalization of flow. Unchanged small bilateral effusions with associated bibasilar heterogeneous opacities. No new focal airspace opacities. There is a minimal amount of subcutaneous emphysema about the left lateral chest wall. Unchanged bones.  IMPRESSION: 1. Interval removal of left-sided chest tube and mediastinal drain with development of a tiny biapical pneumothoraces. Continued attention on follow-up is recommended. 2. Interval removal of PA catheter with remaining vascular sheath tip projected over the central aspect of the right internal jugular vein. 3. Similar findings of pulmonary edema, small bilateral effusions and bibasilar atelectasis. Critical Value/emergent results were called by telephone at the time of interpretation on 02/01/2014 at 8:03 am to [facility], [place], who verbally acknowledged these results.   Electronically Signed   By: Sandi Mariscal M.D.   On: 02/01/2014 08:06    Assessment/Plan: 1. Acute on CKD and fluid overload: Cardiorenal. Scr trending upward, on lasix gtt with inadequate UOP and net positive on fluids. Will initiate CVVHD for fluid removal, goal 50cc/hr.  2. Vascular access: right IJ. 3. Anemia: stable hgb 8.1.  4. Hyponatremia: correct with CVVHD 5. Hypokalemia: correct with CVVHD 6. Acute on chronic CHF: on milrinone, dopamine, levophed.  7. CAD s/p 3v CABG on 62/83: complicated by periop MI, drips per above. 8. Afib: pacemaker, MAZE procedure 10/15   9. Transaminitis: hepatic congestion, cardiogenic shock.   Tawanna Sat 02/01/2014, 4:04 PM   Renal Attending: Mr. Daigle is s/p CABG & MAZE procedureon 66/29 complicated by post op MI, cardiogenic shock marked volume overload and steady rising creatinine since surgery. He is receiving pressor and inotropic support. Renal was asked to do dialysis.  Renal ultrasound on 10/12 revealed increased echogenicity and cortical thinning with 11.5 and 11.7cm kidneys.  UA on 10/10 was negative.  We will proceed with CVVHD support. Avrielle Fry C

## 2014-02-02 ENCOUNTER — Inpatient Hospital Stay (HOSPITAL_COMMUNITY): Payer: Medicare HMO

## 2014-02-02 ENCOUNTER — Encounter: Payer: Commercial Managed Care - HMO | Admitting: Internal Medicine

## 2014-02-02 DIAGNOSIS — I129 Hypertensive chronic kidney disease with stage 1 through stage 4 chronic kidney disease, or unspecified chronic kidney disease: Secondary | ICD-10-CM

## 2014-02-02 DIAGNOSIS — I4891 Unspecified atrial fibrillation: Secondary | ICD-10-CM

## 2014-02-02 DIAGNOSIS — J95811 Postprocedural pneumothorax: Secondary | ICD-10-CM

## 2014-02-02 DIAGNOSIS — I25810 Atherosclerosis of coronary artery bypass graft(s) without angina pectoris: Secondary | ICD-10-CM

## 2014-02-02 DIAGNOSIS — N183 Chronic kidney disease, stage 3 (moderate): Secondary | ICD-10-CM

## 2014-02-02 LAB — POCT I-STAT, CHEM 8
BUN: 34 mg/dL — ABNORMAL HIGH (ref 6–23)
BUN: 38 mg/dL — AB (ref 6–23)
BUN: 32 mg/dL — AB (ref 6–23)
BUN: 37 mg/dL — ABNORMAL HIGH (ref 6–23)
BUN: 30 mg/dL — ABNORMAL HIGH (ref 6–23)
BUN: 35 mg/dL — ABNORMAL HIGH (ref 6–23)
BUN: 30 mg/dL — ABNORMAL HIGH (ref 6–23)
BUN: 36 mg/dL — ABNORMAL HIGH (ref 6–23)
BUN: 30 mg/dL — ABNORMAL HIGH (ref 6–23)
BUN: 37 mg/dL — ABNORMAL HIGH (ref 6–23)
BUN: 31 mg/dL — ABNORMAL HIGH (ref 6–23)
BUN: 33 mg/dL — ABNORMAL HIGH (ref 6–23)
BUN: 30 mg/dL — ABNORMAL HIGH (ref 6–23)
BUN: 33 mg/dL — ABNORMAL HIGH (ref 6–23)
BUN: 29 mg/dL — ABNORMAL HIGH (ref 6–23)
BUN: 33 mg/dL — ABNORMAL HIGH (ref 6–23)
CALCIUM ION: 0.56 mmol/L — AB (ref 1.13–1.30)
CALCIUM ION: 1.01 mmol/L — AB (ref 1.13–1.30)
CALCIUM ION: 0.4 mmol/L — AB (ref 1.13–1.30)
CALCIUM ION: 0.99 mmol/L — AB (ref 1.13–1.30)
CALCIUM ION: 1 mmol/L — AB (ref 1.13–1.30)
CALCIUM ION: 0.45 mmol/L — AB (ref 1.13–1.30)
CALCIUM ION: 0.39 mmol/L — AB (ref 1.13–1.30)
CALCIUM ION: 0.94 mmol/L — AB (ref 1.13–1.30)
CHLORIDE: 84 meq/L — AB (ref 96–112)
CREATININE: 2.4 mg/dL — AB (ref 0.50–1.35)
CREATININE: 2.2 mg/dL — AB (ref 0.50–1.35)
CREATININE: 2.2 mg/dL — AB (ref 0.50–1.35)
CREATININE: 2.7 mg/dL — AB (ref 0.50–1.35)
CREATININE: 2.2 mg/dL — AB (ref 0.50–1.35)
CREATININE: 2.4 mg/dL — AB (ref 0.50–1.35)
Calcium, Ion: 0.4 mmol/L — CL (ref 1.13–1.30)
Calcium, Ion: 0.39 mmol/L — CL (ref 1.13–1.30)
Calcium, Ion: 0.97 mmol/L — ABNORMAL LOW (ref 1.13–1.30)
Calcium, Ion: 0.98 mmol/L — ABNORMAL LOW (ref 1.13–1.30)
Calcium, Ion: 0.38 mmol/L — CL (ref 1.13–1.30)
Calcium, Ion: 0.97 mmol/L — ABNORMAL LOW (ref 1.13–1.30)
Calcium, Ion: 0.4 mmol/L — CL (ref 1.13–1.30)
Calcium, Ion: 0.93 mmol/L — ABNORMAL LOW (ref 1.13–1.30)
Chloride: 83 mEq/L — ABNORMAL LOW (ref 96–112)
Chloride: 85 mEq/L — ABNORMAL LOW (ref 96–112)
Chloride: 83 mEq/L — ABNORMAL LOW (ref 96–112)
Chloride: 84 mEq/L — ABNORMAL LOW (ref 96–112)
Chloride: 82 mEq/L — ABNORMAL LOW (ref 96–112)
Chloride: 84 mEq/L — ABNORMAL LOW (ref 96–112)
Chloride: 81 mEq/L — ABNORMAL LOW (ref 96–112)
Chloride: 83 mEq/L — ABNORMAL LOW (ref 96–112)
Chloride: 82 mEq/L — ABNORMAL LOW (ref 96–112)
Chloride: 85 mEq/L — ABNORMAL LOW (ref 96–112)
Chloride: 82 mEq/L — ABNORMAL LOW (ref 96–112)
Chloride: 81 mEq/L — ABNORMAL LOW (ref 96–112)
Chloride: 84 mEq/L — ABNORMAL LOW (ref 96–112)
Chloride: 80 mEq/L — ABNORMAL LOW (ref 96–112)
Chloride: 82 mEq/L — ABNORMAL LOW (ref 96–112)
Creatinine, Ser: 2.8 mg/dL — ABNORMAL HIGH (ref 0.50–1.35)
Creatinine, Ser: 2.2 mg/dL — ABNORMAL HIGH (ref 0.50–1.35)
Creatinine, Ser: 2.8 mg/dL — ABNORMAL HIGH (ref 0.50–1.35)
Creatinine, Ser: 2.7 mg/dL — ABNORMAL HIGH (ref 0.50–1.35)
Creatinine, Ser: 2.2 mg/dL — ABNORMAL HIGH (ref 0.50–1.35)
Creatinine, Ser: 2.7 mg/dL — ABNORMAL HIGH (ref 0.50–1.35)
Creatinine, Ser: 2.6 mg/dL — ABNORMAL HIGH (ref 0.50–1.35)
Creatinine, Ser: 2.1 mg/dL — ABNORMAL HIGH (ref 0.50–1.35)
Creatinine, Ser: 2.6 mg/dL — ABNORMAL HIGH (ref 0.50–1.35)
Creatinine, Ser: 2 mg/dL — ABNORMAL HIGH (ref 0.50–1.35)
GLUCOSE: 167 mg/dL — AB (ref 70–99)
GLUCOSE: 176 mg/dL — AB (ref 70–99)
GLUCOSE: 236 mg/dL — AB (ref 70–99)
GLUCOSE: 184 mg/dL — AB (ref 70–99)
GLUCOSE: 233 mg/dL — AB (ref 70–99)
GLUCOSE: 176 mg/dL — AB (ref 70–99)
Glucose, Bld: 214 mg/dL — ABNORMAL HIGH (ref 70–99)
Glucose, Bld: 230 mg/dL — ABNORMAL HIGH (ref 70–99)
Glucose, Bld: 179 mg/dL — ABNORMAL HIGH (ref 70–99)
Glucose, Bld: 229 mg/dL — ABNORMAL HIGH (ref 70–99)
Glucose, Bld: 186 mg/dL — ABNORMAL HIGH (ref 70–99)
Glucose, Bld: 235 mg/dL — ABNORMAL HIGH (ref 70–99)
Glucose, Bld: 172 mg/dL — ABNORMAL HIGH (ref 70–99)
Glucose, Bld: 227 mg/dL — ABNORMAL HIGH (ref 70–99)
Glucose, Bld: 177 mg/dL — ABNORMAL HIGH (ref 70–99)
Glucose, Bld: 229 mg/dL — ABNORMAL HIGH (ref 70–99)
HCT: 28 % — ABNORMAL LOW (ref 39.0–52.0)
HCT: 30 % — ABNORMAL LOW (ref 39.0–52.0)
HCT: 28 % — ABNORMAL LOW (ref 39.0–52.0)
HCT: 28 % — ABNORMAL LOW (ref 39.0–52.0)
HEMATOCRIT: 30 % — AB (ref 39.0–52.0)
HEMATOCRIT: 28 % — AB (ref 39.0–52.0)
HEMATOCRIT: 29 % — AB (ref 39.0–52.0)
HEMATOCRIT: 32 % — AB (ref 39.0–52.0)
HEMATOCRIT: 30 % — AB (ref 39.0–52.0)
HEMATOCRIT: 31 % — AB (ref 39.0–52.0)
HEMATOCRIT: 27 % — AB (ref 39.0–52.0)
HEMATOCRIT: 31 % — AB (ref 39.0–52.0)
HEMATOCRIT: 28 % — AB (ref 39.0–52.0)
HEMATOCRIT: 31 % — AB (ref 39.0–52.0)
HEMATOCRIT: 29 % — AB (ref 39.0–52.0)
HEMATOCRIT: 31 % — AB (ref 39.0–52.0)
HEMOGLOBIN: 10.2 g/dL — AB (ref 13.0–17.0)
HEMOGLOBIN: 10.2 g/dL — AB (ref 13.0–17.0)
HEMOGLOBIN: 9.5 g/dL — AB (ref 13.0–17.0)
HEMOGLOBIN: 10.2 g/dL — AB (ref 13.0–17.0)
HEMOGLOBIN: 10.5 g/dL — AB (ref 13.0–17.0)
HEMOGLOBIN: 9.5 g/dL — AB (ref 13.0–17.0)
HEMOGLOBIN: 10.5 g/dL — AB (ref 13.0–17.0)
HEMOGLOBIN: 9.2 g/dL — AB (ref 13.0–17.0)
HEMOGLOBIN: 9.5 g/dL — AB (ref 13.0–17.0)
HEMOGLOBIN: 9.9 g/dL — AB (ref 13.0–17.0)
Hemoglobin: 9.5 g/dL — ABNORMAL LOW (ref 13.0–17.0)
Hemoglobin: 10.9 g/dL — ABNORMAL LOW (ref 13.0–17.0)
Hemoglobin: 9.9 g/dL — ABNORMAL LOW (ref 13.0–17.0)
Hemoglobin: 9.5 g/dL — ABNORMAL LOW (ref 13.0–17.0)
Hemoglobin: 10.5 g/dL — ABNORMAL LOW (ref 13.0–17.0)
Hemoglobin: 10.5 g/dL — ABNORMAL LOW (ref 13.0–17.0)
POTASSIUM: 3.3 meq/L — AB (ref 3.7–5.3)
POTASSIUM: 3.1 meq/L — AB (ref 3.7–5.3)
Potassium: 3 mEq/L — ABNORMAL LOW (ref 3.7–5.3)
Potassium: 3 mEq/L — ABNORMAL LOW (ref 3.7–5.3)
Potassium: 3.1 mEq/L — ABNORMAL LOW (ref 3.7–5.3)
Potassium: 3.2 mEq/L — ABNORMAL LOW (ref 3.7–5.3)
Potassium: 3.1 mEq/L — ABNORMAL LOW (ref 3.7–5.3)
Potassium: 3.2 mEq/L — ABNORMAL LOW (ref 3.7–5.3)
Potassium: 3.1 mEq/L — ABNORMAL LOW (ref 3.7–5.3)
Potassium: 3.3 mEq/L — ABNORMAL LOW (ref 3.7–5.3)
Potassium: 3.1 mEq/L — ABNORMAL LOW (ref 3.7–5.3)
Potassium: 3.2 mEq/L — ABNORMAL LOW (ref 3.7–5.3)
Potassium: 3.2 mEq/L — ABNORMAL LOW (ref 3.7–5.3)
Potassium: 3.2 mEq/L — ABNORMAL LOW (ref 3.7–5.3)
Potassium: 3.1 mEq/L — ABNORMAL LOW (ref 3.7–5.3)
Potassium: 3.1 mEq/L — ABNORMAL LOW (ref 3.7–5.3)
SODIUM: 134 meq/L — AB (ref 137–147)
SODIUM: 134 meq/L — AB (ref 137–147)
SODIUM: 132 meq/L — AB (ref 137–147)
SODIUM: 131 meq/L — AB (ref 137–147)
SODIUM: 132 meq/L — AB (ref 137–147)
SODIUM: 135 meq/L — AB (ref 137–147)
SODIUM: 133 meq/L — AB (ref 137–147)
SODIUM: 135 meq/L — AB (ref 137–147)
Sodium: 131 mEq/L — ABNORMAL LOW (ref 137–147)
Sodium: 133 mEq/L — ABNORMAL LOW (ref 137–147)
Sodium: 132 mEq/L — ABNORMAL LOW (ref 137–147)
Sodium: 132 mEq/L — ABNORMAL LOW (ref 137–147)
Sodium: 134 mEq/L — ABNORMAL LOW (ref 137–147)
Sodium: 134 mEq/L — ABNORMAL LOW (ref 137–147)
Sodium: 132 mEq/L — ABNORMAL LOW (ref 137–147)
Sodium: 134 mEq/L — ABNORMAL LOW (ref 137–147)
TCO2: 30 mmol/L (ref 0–100)
TCO2: 32 mmol/L (ref 0–100)
TCO2: 29 mmol/L (ref 0–100)
TCO2: 33 mmol/L (ref 0–100)
TCO2: 29 mmol/L (ref 0–100)
TCO2: 31 mmol/L (ref 0–100)
TCO2: 30 mmol/L (ref 0–100)
TCO2: 31 mmol/L (ref 0–100)
TCO2: 31 mmol/L (ref 0–100)
TCO2: 35 mmol/L (ref 0–100)
TCO2: 34 mmol/L (ref 0–100)
TCO2: 35 mmol/L (ref 0–100)
TCO2: 32 mmol/L (ref 0–100)
TCO2: 35 mmol/L (ref 0–100)
TCO2: 33 mmol/L (ref 0–100)
TCO2: 35 mmol/L (ref 0–100)

## 2014-02-02 LAB — POCT I-STAT EG7
ACID-BASE EXCESS: 1 mmol/L (ref 0.0–2.0)
ACID-BASE EXCESS: 7 mmol/L — AB (ref 0.0–2.0)
Acid-Base Excess: 2 mmol/L (ref 0.0–2.0)
Acid-Base Excess: 3 mmol/L — ABNORMAL HIGH (ref 0.0–2.0)
Acid-Base Excess: 5 mmol/L — ABNORMAL HIGH (ref 0.0–2.0)
Acid-Base Excess: 5 mmol/L — ABNORMAL HIGH (ref 0.0–2.0)
Acid-Base Excess: 5 mmol/L — ABNORMAL HIGH (ref 0.0–2.0)
Acid-Base Excess: 7 mmol/L — ABNORMAL HIGH (ref 0.0–2.0)
Acid-Base Excess: 7 mmol/L — ABNORMAL HIGH (ref 0.0–2.0)
Acid-base deficit: 1 mmol/L (ref 0.0–2.0)
BICARBONATE: 27.1 meq/L — AB (ref 20.0–24.0)
BICARBONATE: 30.1 meq/L — AB (ref 20.0–24.0)
BICARBONATE: 29.7 meq/L — AB (ref 20.0–24.0)
BICARBONATE: 31.5 meq/L — AB (ref 20.0–24.0)
BICARBONATE: 31.9 meq/L — AB (ref 20.0–24.0)
Bicarbonate: 24 mEq/L (ref 20.0–24.0)
Bicarbonate: 25.4 mEq/L — ABNORMAL HIGH (ref 20.0–24.0)
Bicarbonate: 26 mEq/L — ABNORMAL HIGH (ref 20.0–24.0)
Bicarbonate: 27.4 mEq/L — ABNORMAL HIGH (ref 20.0–24.0)
Bicarbonate: 30.3 mEq/L — ABNORMAL HIGH (ref 20.0–24.0)
Bicarbonate: 31.7 mEq/L — ABNORMAL HIGH (ref 20.0–24.0)
CALCIUM ION: 0.47 mmol/L — AB (ref 1.13–1.30)
CALCIUM ION: 0.49 mmol/L — AB (ref 1.13–1.30)
CALCIUM ION: 0.51 mmol/L — AB (ref 1.13–1.30)
CALCIUM ION: 0.52 mmol/L — AB (ref 1.13–1.30)
Calcium, Ion: 0.49 mmol/L — CL (ref 1.13–1.30)
Calcium, Ion: 0.48 mmol/L — CL (ref 1.13–1.30)
Calcium, Ion: 0.46 mmol/L — CL (ref 1.13–1.30)
Calcium, Ion: 0.46 mmol/L — CL (ref 1.13–1.30)
Calcium, Ion: 0.44 mmol/L — CL (ref 1.13–1.30)
Calcium, Ion: 0.44 mmol/L — CL (ref 1.13–1.30)
Calcium, Ion: 0.44 mmol/L — CL (ref 1.13–1.30)
HCT: 28 % — ABNORMAL LOW (ref 39.0–52.0)
HCT: 28 % — ABNORMAL LOW (ref 39.0–52.0)
HCT: 28 % — ABNORMAL LOW (ref 39.0–52.0)
HCT: 28 % — ABNORMAL LOW (ref 39.0–52.0)
HCT: 28 % — ABNORMAL LOW (ref 39.0–52.0)
HCT: 28 % — ABNORMAL LOW (ref 39.0–52.0)
HCT: 30 % — ABNORMAL LOW (ref 39.0–52.0)
HCT: 30 % — ABNORMAL LOW (ref 39.0–52.0)
HCT: 29 % — ABNORMAL LOW (ref 39.0–52.0)
HEMATOCRIT: 28 % — AB (ref 39.0–52.0)
HEMATOCRIT: 30 % — AB (ref 39.0–52.0)
HEMOGLOBIN: 10.2 g/dL — AB (ref 13.0–17.0)
HEMOGLOBIN: 10.2 g/dL — AB (ref 13.0–17.0)
HEMOGLOBIN: 9.5 g/dL — AB (ref 13.0–17.0)
HEMOGLOBIN: 9.5 g/dL — AB (ref 13.0–17.0)
HEMOGLOBIN: 9.5 g/dL — AB (ref 13.0–17.0)
HEMOGLOBIN: 9.5 g/dL — AB (ref 13.0–17.0)
HEMOGLOBIN: 9.5 g/dL — AB (ref 13.0–17.0)
HEMOGLOBIN: 9.5 g/dL — AB (ref 13.0–17.0)
Hemoglobin: 9.5 g/dL — ABNORMAL LOW (ref 13.0–17.0)
Hemoglobin: 10.2 g/dL — ABNORMAL LOW (ref 13.0–17.0)
Hemoglobin: 9.9 g/dL — ABNORMAL LOW (ref 13.0–17.0)
O2 SAT: 43 %
O2 SAT: 38 %
O2 SAT: 45 %
O2 Saturation: 34 %
O2 Saturation: 41 %
O2 Saturation: 47 %
O2 Saturation: 48 %
O2 Saturation: 44 %
O2 Saturation: 46 %
O2 Saturation: 48 %
O2 Saturation: 46 %
PCO2 VEN: 42.3 mmHg — AB (ref 45.0–50.0)
PCO2 VEN: 42.4 mmHg — AB (ref 45.0–50.0)
PCO2 VEN: 44.7 mmHg — AB (ref 45.0–50.0)
PH VEN: 7.37 — AB (ref 7.250–7.300)
PH VEN: 7.419 — AB (ref 7.250–7.300)
PH VEN: 7.428 — AB (ref 7.250–7.300)
PH VEN: 7.437 — AB (ref 7.250–7.300)
PH VEN: 7.454 — AB (ref 7.250–7.300)
PO2 VEN: 24 mmHg — AB (ref 30.0–45.0)
PO2 VEN: 25 mmHg — AB (ref 30.0–45.0)
PO2 VEN: 23 mmHg — AB (ref 30.0–45.0)
PO2 VEN: 25 mmHg — AB (ref 30.0–45.0)
PO2 VEN: 23 mmHg — AB (ref 30.0–45.0)
PO2 VEN: 24 mmHg — AB (ref 30.0–45.0)
POTASSIUM: 2.8 meq/L — AB (ref 3.7–5.3)
POTASSIUM: 2.9 meq/L — AB (ref 3.7–5.3)
POTASSIUM: 3 meq/L — AB (ref 3.7–5.3)
POTASSIUM: 3.1 meq/L — AB (ref 3.7–5.3)
POTASSIUM: 3.3 meq/L — AB (ref 3.7–5.3)
Patient temperature: 98.2
Patient temperature: 98.6
Patient temperature: 97.6
Potassium: 3.1 mEq/L — ABNORMAL LOW (ref 3.7–5.3)
Potassium: 2.9 mEq/L — CL (ref 3.7–5.3)
Potassium: 2.9 mEq/L — CL (ref 3.7–5.3)
Potassium: 2.8 mEq/L — CL (ref 3.7–5.3)
Potassium: 2.9 mEq/L — CL (ref 3.7–5.3)
Potassium: 2.9 mEq/L — CL (ref 3.7–5.3)
SODIUM: 131 meq/L — AB (ref 137–147)
SODIUM: 131 meq/L — AB (ref 137–147)
SODIUM: 134 meq/L — AB (ref 137–147)
SODIUM: 134 meq/L — AB (ref 137–147)
Sodium: 132 mEq/L — ABNORMAL LOW (ref 137–147)
Sodium: 132 mEq/L — ABNORMAL LOW (ref 137–147)
Sodium: 132 mEq/L — ABNORMAL LOW (ref 137–147)
Sodium: 133 mEq/L — ABNORMAL LOW (ref 137–147)
Sodium: 133 mEq/L — ABNORMAL LOW (ref 137–147)
Sodium: 134 mEq/L — ABNORMAL LOW (ref 137–147)
Sodium: 134 mEq/L — ABNORMAL LOW (ref 137–147)
TCO2: 27 mmol/L (ref 0–100)
TCO2: 28 mmol/L (ref 0–100)
TCO2: 25 mmol/L (ref 0–100)
TCO2: 27 mmol/L (ref 0–100)
TCO2: 29 mmol/L (ref 0–100)
TCO2: 31 mmol/L (ref 0–100)
TCO2: 32 mmol/L (ref 0–100)
TCO2: 31 mmol/L (ref 0–100)
TCO2: 33 mmol/L (ref 0–100)
TCO2: 33 mmol/L (ref 0–100)
TCO2: 33 mmol/L (ref 0–100)
pCO2, Ven: 41.4 mmHg — ABNORMAL LOW (ref 45.0–50.0)
pCO2, Ven: 41.5 mmHg — ABNORMAL LOW (ref 45.0–50.0)
pCO2, Ven: 41.6 mmHg — ABNORMAL LOW (ref 45.0–50.0)
pCO2, Ven: 44.6 mmHg — ABNORMAL LOW (ref 45.0–50.0)
pCO2, Ven: 46 mmHg (ref 45.0–50.0)
pCO2, Ven: 46.9 mmHg (ref 45.0–50.0)
pCO2, Ven: 45.4 mmHg (ref 45.0–50.0)
pCO2, Ven: 46.2 mmHg (ref 45.0–50.0)
pH, Ven: 7.394 — ABNORMAL HIGH (ref 7.250–7.300)
pH, Ven: 7.397 — ABNORMAL HIGH (ref 7.250–7.300)
pH, Ven: 7.424 — ABNORMAL HIGH (ref 7.250–7.300)
pH, Ven: 7.419 — ABNORMAL HIGH (ref 7.250–7.300)
pH, Ven: 7.444 — ABNORMAL HIGH (ref 7.250–7.300)
pH, Ven: 7.442 — ABNORMAL HIGH (ref 7.250–7.300)
pO2, Ven: 21 mmHg — CL (ref 30.0–45.0)
pO2, Ven: 22 mmHg — CL (ref 30.0–45.0)
pO2, Ven: 25 mmHg — CL (ref 30.0–45.0)
pO2, Ven: 25 mmHg — CL (ref 30.0–45.0)
pO2, Ven: 24 mmHg — CL (ref 30.0–45.0)

## 2014-02-02 LAB — RENAL FUNCTION PANEL
ALBUMIN: 2.9 g/dL — AB (ref 3.5–5.2)
ANION GAP: 18 — AB (ref 5–15)
Albumin: 3 g/dL — ABNORMAL LOW (ref 3.5–5.2)
Albumin: 2.9 g/dL — ABNORMAL LOW (ref 3.5–5.2)
Anion gap: 18 — ABNORMAL HIGH (ref 5–15)
Anion gap: 16 — ABNORMAL HIGH (ref 5–15)
BUN: 51 mg/dL — ABNORMAL HIGH (ref 6–23)
BUN: 41 mg/dL — AB (ref 6–23)
BUN: 36 mg/dL — ABNORMAL HIGH (ref 6–23)
CHLORIDE: 88 meq/L — AB (ref 96–112)
CHLORIDE: 85 meq/L — AB (ref 96–112)
CO2: 24 meq/L (ref 19–32)
CO2: 31 mEq/L (ref 19–32)
CO2: 34 mEq/L — ABNORMAL HIGH (ref 19–32)
CREATININE: 2.47 mg/dL — AB (ref 0.50–1.35)
Calcium: 9.1 mg/dL (ref 8.4–10.5)
Calcium: 9.9 mg/dL (ref 8.4–10.5)
Calcium: 9.9 mg/dL (ref 8.4–10.5)
Chloride: 86 mEq/L — ABNORMAL LOW (ref 96–112)
Creatinine, Ser: 3.41 mg/dL — ABNORMAL HIGH (ref 0.50–1.35)
Creatinine, Ser: 2.69 mg/dL — ABNORMAL HIGH (ref 0.50–1.35)
GFR calc Af Amer: 27 mL/min — ABNORMAL LOW (ref >90)
GFR calc non Af Amer: 21 mL/min — ABNORMAL LOW (ref >90)
GFR calc non Af Amer: 23 mL/min — ABNORMAL LOW (ref >90)
GFR, EST AFRICAN AMERICAN: 18 mL/min — AB (ref >90)
GFR, EST AFRICAN AMERICAN: 24 mL/min — AB (ref >90)
GFR, EST NON AFRICAN AMERICAN: 16 mL/min — AB (ref >90)
Glucose, Bld: 159 mg/dL — ABNORMAL HIGH (ref 70–99)
Glucose, Bld: 167 mg/dL — ABNORMAL HIGH (ref 70–99)
Glucose, Bld: 161 mg/dL — ABNORMAL HIGH (ref 70–99)
POTASSIUM: 3.2 meq/L — AB (ref 3.7–5.3)
Phosphorus: 4.5 mg/dL (ref 2.3–4.6)
Phosphorus: 3.2 mg/dL (ref 2.3–4.6)
Phosphorus: 2.9 mg/dL (ref 2.3–4.6)
Potassium: 3.2 mEq/L — ABNORMAL LOW (ref 3.7–5.3)
Potassium: 3.3 mEq/L — ABNORMAL LOW (ref 3.7–5.3)
SODIUM: 135 meq/L — AB (ref 137–147)
Sodium: 130 mEq/L — ABNORMAL LOW (ref 137–147)
Sodium: 135 mEq/L — ABNORMAL LOW (ref 137–147)

## 2014-02-02 LAB — POCT I-STAT 7, (LYTES, BLD GAS, ICA,H+H)
ACID-BASE DEFICIT: 1 mmol/L (ref 0.0–2.0)
ACID-BASE DEFICIT: 3 mmol/L — AB (ref 0.0–2.0)
ACID-BASE EXCESS: 6 mmol/L — AB (ref 0.0–2.0)
ACID-BASE EXCESS: 7 mmol/L — AB (ref 0.0–2.0)
ACID-BASE EXCESS: 9 mmol/L — AB (ref 0.0–2.0)
ACID-BASE EXCESS: 9 mmol/L — AB (ref 0.0–2.0)
Acid-Base Excess: 4 mmol/L — ABNORMAL HIGH (ref 0.0–2.0)
Acid-Base Excess: 8 mmol/L — ABNORMAL HIGH (ref 0.0–2.0)
Acid-Base Excess: 8 mmol/L — ABNORMAL HIGH (ref 0.0–2.0)
Acid-base deficit: 3 mmol/L — ABNORMAL HIGH (ref 0.0–2.0)
BICARBONATE: 20.4 meq/L (ref 20.0–24.0)
BICARBONATE: 21.2 meq/L (ref 20.0–24.0)
BICARBONATE: 22.4 meq/L (ref 20.0–24.0)
BICARBONATE: 30.3 meq/L — AB (ref 20.0–24.0)
BICARBONATE: 32.5 meq/L — AB (ref 20.0–24.0)
Bicarbonate: 26.7 mEq/L — ABNORMAL HIGH (ref 20.0–24.0)
Bicarbonate: 29.3 mEq/L — ABNORMAL HIGH (ref 20.0–24.0)
Bicarbonate: 30.9 mEq/L — ABNORMAL HIGH (ref 20.0–24.0)
Bicarbonate: 31.4 mEq/L — ABNORMAL HIGH (ref 20.0–24.0)
Bicarbonate: 32.6 mEq/L — ABNORMAL HIGH (ref 20.0–24.0)
CALCIUM ION: 1 mmol/L — AB (ref 1.13–1.30)
CALCIUM ION: 0.98 mmol/L — AB (ref 1.13–1.30)
CALCIUM ION: 0.99 mmol/L — AB (ref 1.13–1.30)
CALCIUM ION: 0.96 mmol/L — AB (ref 1.13–1.30)
CALCIUM ION: 1.01 mmol/L — AB (ref 1.13–1.30)
Calcium, Ion: 0.99 mmol/L — ABNORMAL LOW (ref 1.13–1.30)
Calcium, Ion: 1 mmol/L — ABNORMAL LOW (ref 1.13–1.30)
Calcium, Ion: 1 mmol/L — ABNORMAL LOW (ref 1.13–1.30)
Calcium, Ion: 1.02 mmol/L — ABNORMAL LOW (ref 1.13–1.30)
Calcium, Ion: 0.97 mmol/L — ABNORMAL LOW (ref 1.13–1.30)
HCT: 25 % — ABNORMAL LOW (ref 39.0–52.0)
HCT: 26 % — ABNORMAL LOW (ref 39.0–52.0)
HCT: 26 % — ABNORMAL LOW (ref 39.0–52.0)
HCT: 27 % — ABNORMAL LOW (ref 39.0–52.0)
HCT: 28 % — ABNORMAL LOW (ref 39.0–52.0)
HCT: 28 % — ABNORMAL LOW (ref 39.0–52.0)
HEMATOCRIT: 26 % — AB (ref 39.0–52.0)
HEMATOCRIT: 26 % — AB (ref 39.0–52.0)
HEMATOCRIT: 26 % — AB (ref 39.0–52.0)
HEMATOCRIT: 28 % — AB (ref 39.0–52.0)
HEMOGLOBIN: 8.8 g/dL — AB (ref 13.0–17.0)
HEMOGLOBIN: 9.5 g/dL — AB (ref 13.0–17.0)
Hemoglobin: 8.5 g/dL — ABNORMAL LOW (ref 13.0–17.0)
Hemoglobin: 8.8 g/dL — ABNORMAL LOW (ref 13.0–17.0)
Hemoglobin: 8.8 g/dL — ABNORMAL LOW (ref 13.0–17.0)
Hemoglobin: 8.8 g/dL — ABNORMAL LOW (ref 13.0–17.0)
Hemoglobin: 8.8 g/dL — ABNORMAL LOW (ref 13.0–17.0)
Hemoglobin: 9.2 g/dL — ABNORMAL LOW (ref 13.0–17.0)
Hemoglobin: 9.5 g/dL — ABNORMAL LOW (ref 13.0–17.0)
Hemoglobin: 9.5 g/dL — ABNORMAL LOW (ref 13.0–17.0)
O2 SAT: 97 %
O2 SAT: 97 %
O2 SAT: 97 %
O2 Saturation: 95 %
O2 Saturation: 96 %
O2 Saturation: 96 %
O2 Saturation: 92 %
O2 Saturation: 95 %
O2 Saturation: 93 %
O2 Saturation: 94 %
PCO2 ART: 29.9 mmHg — AB (ref 35.0–45.0)
PCO2 ART: 33.6 mmHg — AB (ref 35.0–45.0)
PCO2 ART: 33.4 mmHg — AB (ref 35.0–45.0)
PCO2 ART: 37.1 mmHg (ref 35.0–45.0)
PH ART: 7.541 — AB (ref 7.350–7.450)
PH ART: 7.545 — AB (ref 7.350–7.450)
PO2 ART: 80 mmHg (ref 80.0–100.0)
POTASSIUM: 3.5 meq/L — AB (ref 3.7–5.3)
POTASSIUM: 2.9 meq/L — AB (ref 3.7–5.3)
POTASSIUM: 2.8 meq/L — AB (ref 3.7–5.3)
Patient temperature: 98.2
Patient temperature: 98.6
Patient temperature: 97.6
Potassium: 3 mEq/L — ABNORMAL LOW (ref 3.7–5.3)
Potassium: 3.2 mEq/L — ABNORMAL LOW (ref 3.7–5.3)
Potassium: 3.3 mEq/L — ABNORMAL LOW (ref 3.7–5.3)
Potassium: 2.9 mEq/L — CL (ref 3.7–5.3)
Potassium: 2.9 mEq/L — CL (ref 3.7–5.3)
Potassium: 2.9 mEq/L — CL (ref 3.7–5.3)
Potassium: 2.9 mEq/L — CL (ref 3.7–5.3)
SODIUM: 128 meq/L — AB (ref 137–147)
SODIUM: 131 meq/L — AB (ref 137–147)
SODIUM: 131 meq/L — AB (ref 137–147)
Sodium: 127 mEq/L — ABNORMAL LOW (ref 137–147)
Sodium: 128 mEq/L — ABNORMAL LOW (ref 137–147)
Sodium: 129 mEq/L — ABNORMAL LOW (ref 137–147)
Sodium: 130 mEq/L — ABNORMAL LOW (ref 137–147)
Sodium: 131 mEq/L — ABNORMAL LOW (ref 137–147)
Sodium: 131 mEq/L — ABNORMAL LOW (ref 137–147)
Sodium: 132 mEq/L — ABNORMAL LOW (ref 137–147)
TCO2: 21 mmol/L (ref 0–100)
TCO2: 22 mmol/L (ref 0–100)
TCO2: 23 mmol/L (ref 0–100)
TCO2: 28 mmol/L (ref 0–100)
TCO2: 30 mmol/L (ref 0–100)
TCO2: 31 mmol/L (ref 0–100)
TCO2: 32 mmol/L (ref 0–100)
TCO2: 33 mmol/L (ref 0–100)
TCO2: 34 mmol/L (ref 0–100)
TCO2: 34 mmol/L (ref 0–100)
pCO2 arterial: 31.6 mmHg — ABNORMAL LOW (ref 35.0–45.0)
pCO2 arterial: 32.6 mmHg — ABNORMAL LOW (ref 35.0–45.0)
pCO2 arterial: 35.4 mmHg (ref 35.0–45.0)
pCO2 arterial: 35.9 mmHg (ref 35.0–45.0)
pCO2 arterial: 42 mmHg (ref 35.0–45.0)
pCO2 arterial: 37.4 mmHg (ref 35.0–45.0)
pH, Arterial: 7.434 (ref 7.350–7.450)
pH, Arterial: 7.441 (ref 7.350–7.450)
pH, Arterial: 7.446 (ref 7.350–7.450)
pH, Arterial: 7.509 — ABNORMAL HIGH (ref 7.350–7.450)
pH, Arterial: 7.525 — ABNORMAL HIGH (ref 7.350–7.450)
pH, Arterial: 7.481 — ABNORMAL HIGH (ref 7.350–7.450)
pH, Arterial: 7.565 — ABNORMAL HIGH (ref 7.350–7.450)
pH, Arterial: 7.551 — ABNORMAL HIGH (ref 7.350–7.450)
pO2, Arterial: 74 mmHg — ABNORMAL LOW (ref 80.0–100.0)
pO2, Arterial: 74 mmHg — ABNORMAL LOW (ref 80.0–100.0)
pO2, Arterial: 79 mmHg — ABNORMAL LOW (ref 80.0–100.0)
pO2, Arterial: 84 mmHg (ref 80.0–100.0)
pO2, Arterial: 60 mmHg — ABNORMAL LOW (ref 80.0–100.0)
pO2, Arterial: 65 mmHg — ABNORMAL LOW (ref 80.0–100.0)
pO2, Arterial: 77 mmHg — ABNORMAL LOW (ref 80.0–100.0)
pO2, Arterial: 57 mmHg — ABNORMAL LOW (ref 80.0–100.0)
pO2, Arterial: 61 mmHg — ABNORMAL LOW (ref 80.0–100.0)

## 2014-02-02 LAB — HEPATIC FUNCTION PANEL
ALT: 368 U/L — ABNORMAL HIGH (ref 0–53)
AST: 107 U/L — ABNORMAL HIGH (ref 0–37)
Albumin: 3 g/dL — ABNORMAL LOW (ref 3.5–5.2)
Alkaline Phosphatase: 75 U/L (ref 39–117)
BILIRUBIN INDIRECT: 0.8 mg/dL (ref 0.3–0.9)
Bilirubin, Direct: 0.5 mg/dL — ABNORMAL HIGH (ref 0.0–0.3)
Total Bilirubin: 1.3 mg/dL — ABNORMAL HIGH (ref 0.3–1.2)
Total Protein: 5.9 g/dL — ABNORMAL LOW (ref 6.0–8.3)

## 2014-02-02 LAB — CARBOXYHEMOGLOBIN
Carboxyhemoglobin: 1.6 % — ABNORMAL HIGH (ref 0.5–1.5)
Methemoglobin: 1 % (ref 0.0–1.5)
O2 Saturation: 45.9 %
Total hemoglobin: 8.1 g/dL — ABNORMAL LOW (ref 13.5–18.0)

## 2014-02-02 LAB — GLUCOSE, CAPILLARY
GLUCOSE-CAPILLARY: 162 mg/dL — AB (ref 70–99)
GLUCOSE-CAPILLARY: 153 mg/dL — AB (ref 70–99)
GLUCOSE-CAPILLARY: 179 mg/dL — AB (ref 70–99)
Glucose-Capillary: 172 mg/dL — ABNORMAL HIGH (ref 70–99)

## 2014-02-02 LAB — CBC
HCT: 23.8 % — ABNORMAL LOW (ref 39.0–52.0)
Hemoglobin: 8.2 g/dL — ABNORMAL LOW (ref 13.0–17.0)
MCH: 30.8 pg (ref 26.0–34.0)
MCHC: 34.5 g/dL (ref 30.0–36.0)
MCV: 89.5 fL (ref 78.0–100.0)
PLATELETS: 178 10*3/uL (ref 150–400)
RBC: 2.66 MIL/uL — ABNORMAL LOW (ref 4.22–5.81)
RDW: 16.4 % — AB (ref 11.5–15.5)
WBC: 16.5 10*3/uL — ABNORMAL HIGH (ref 4.0–10.5)

## 2014-02-02 LAB — CALCIUM, IONIZED: Calcium, Ion: 0.98 mmol/L — ABNORMAL LOW (ref 1.13–1.30)

## 2014-02-02 LAB — MAGNESIUM: Magnesium: 2.4 mg/dL (ref 1.5–2.5)

## 2014-02-02 MED ORDER — POTASSIUM CHLORIDE CRYS ER 20 MEQ PO TBCR
20.0000 meq | EXTENDED_RELEASE_TABLET | Freq: Once | ORAL | Status: AC
  Administered 2014-02-02: 20 meq via ORAL
  Filled 2014-02-02: qty 1

## 2014-02-02 MED ORDER — LIDOCAINE HCL (PF) 1 % IJ SOLN
INTRAMUSCULAR | Status: AC
  Administered 2014-02-02: 5 mL
  Filled 2014-02-02: qty 20

## 2014-02-02 MED ORDER — LIDOCAINE HCL (PF) 1 % IJ SOLN
INTRAMUSCULAR | Status: AC
  Administered 2014-02-02: 5 mL
  Filled 2014-02-02: qty 5

## 2014-02-02 MED ORDER — MIDAZOLAM HCL 2 MG/2ML IJ SOLN
INTRAMUSCULAR | Status: AC
  Filled 2014-02-02: qty 2

## 2014-02-02 MED ORDER — MIDAZOLAM HCL 2 MG/2ML IJ SOLN
2.0000 mg | Freq: Once | INTRAMUSCULAR | Status: AC
  Administered 2014-02-02: 2 mg via INTRAVENOUS

## 2014-02-02 MED ORDER — POTASSIUM CHLORIDE CRYS ER 20 MEQ PO TBCR
40.0000 meq | EXTENDED_RELEASE_TABLET | Freq: Once | ORAL | Status: AC
  Administered 2014-02-02: 40 meq via ORAL
  Filled 2014-02-02: qty 2

## 2014-02-02 NOTE — Progress Notes (Signed)
Patient ID: Francisco Merritt, male   DOB: 12/26/1929, 80 y.o.   MRN: [id number] EVENING ROUNDS NOTE :     [address]       [place] [address]             [phone]                 6 Days Post-Op Procedure(s) (LRB): CORONARY ARTERY BYPASS GRAFTING (CABG), ON PUMP, TIMES THREE, USING LEFT INTERNAL MAMMARY ARTERY, RIGHT GREATER SAPHENOUS VEIN HARVESTED ENDOSCOPICALLY. (N/A) MAZE (N/A) INTRAOPERATIVE TRANSESOPHAGEAL ECHOCARDIOGRAM (N/A)  Total Length of Stay:  LOS: 14 days  BP 81/52  Pulse 80  Temp(Src) 97.4 F (36.3 C) (Oral)  Resp 24  Ht 5\' 11"  (1.803 m)  Wt 247 lb 2.2 oz (112.1 kg)  BMI 34.48 kg/m2  SpO2 93%  .Intake/Output     10/20 0701 - 10/21 0700 10/21 0701 - 10/22 0700   P.O. 240 150   I.V. (mL/kg) 1818.6 (16.2) 860.4 (7.7)   Total Intake(mL/kg) 2058.6 (18.4) 1010.4 (9)   Urine (mL/kg/hr) 1330 (0.5) 235 (0.2)   Other 1205 (0.4) 1558 (1.4)   Chest Tube  550 (0.5)   Total Output 2535 2343   Net -476.4 -1332.6          . sodium chloride Stopped (02/01/14 2000)  . sodium chloride    . amiodarone 30 mg/hr (02/02/14 1600)  . calcium gluconate infusion for CRRT 20 g (02/01/14 2200)  . DOPamine 3 mcg/kg/min (02/02/14 1600)  . furosemide (LASIX) infusion Stopped (02/01/14 1700)  . lactated ringers Stopped (01/30/14 1400)  . milrinone 0.25 mcg/kg/min (02/02/14 1600)  . norepinephrine (LEVOPHED) Adult infusion 6 mcg/min (02/02/14 1600)  . dialysis replacement fluid (prismasate) 250 mL/hr at 02/01/14 2055  . dialysate (PRISMASATE) 1,000 mL/hr at 02/02/14 1243  . sodium citrate 2 %/dextrose 2.5% solution 3000 mL 250 mL/hr at 02/02/14 6333     Lab Results  Component Value Date   WBC 16.5* 02/02/2014   HGB 9.5* 02/02/2014   HCT 28.0* 02/02/2014   PLT 178 02/02/2014   GLUCOSE 167* 02/02/2014   CHOL 114 01/20/2014   TRIG 110 01/20/2014   HDL 43 01/20/2014   LDLCALC 49 01/20/2014   ALT 368* 02/02/2014   AST 107* 02/02/2014   NA 131* 02/02/2014   K 3.0* 02/02/2014   CL 85* 02/02/2014   CREATININE 2.80* 02/02/2014   BUN 38* 02/02/2014   CO2 31 02/02/2014   TSH 1.280 02/03/2014   PSA 2.26 09/06/2008   INR 1.74* 02/08/2014   HGBA1C 6.0* 01/15/2014   Will hold Lipitor while lfts are elevated mildly confused Cr now 2.8 Chronic Kidney Disease  Stage IIIB GFR 30-44  Stage IV   GFR 15-29   Lab Results  Component Value Date   CREATININE 2.80* 02/02/2014   Estimated Creatinine Clearance: 26.3 ml/min (by C-G formula based on Cr of 2.8).   Grace Isaac MD  Beeper [phone] Office [phone] 02/02/2014 4:42 PM

## 2014-02-02 NOTE — Procedures (Signed)
After informed consent right lateral chest prepped and draped 2 mg versed IV Local with 20 ml of 1% lidocaine 28 F chest tube placed without difficulty + air and serosanguinous fluid Tolerated well

## 2014-02-02 NOTE — Progress Notes (Signed)
Advanced Heart Failure Rounding Note   Subjective:     Francisco Merritt is a 80 y.o. male with a history of obesity, hypertension, CAD, paroxysmal atrial fibrillation, systolic HF EF 18-56%, tachybradycardia syndrome and is status post pacemaker  Underwent R/L cath 10/7.  Severe 2V CAD.  Underwent CABG and Maze on 10/15. (left internal mammary artery to left anterior  descending, saphenous vein graft to first diagonal and OM1);   Suffered perioperative MI with trop > 20.   Trialysis catheter placed yesterday and CVVHD started. Toelrating fluid removal at 50/hr. Underwent right chest tube placement today for small PTX. Was confused from sedation. Remains in NSR on amio. Attempting to wean pressors.  Echo with poor images. LVEF "mild to moderately" depressed.    Objective:   Weight Range:  Vital Signs:   Temp:  [97.4 F (36.3 C)-98.9 F (37.2 C)] 97.4 F (36.3 C) (10/21 1613) Pulse Rate:  [41-154] 80 (10/21 1600) Resp:  [12-34] 24 (10/21 1600) BP: (41-121)/(26-86) 81/52 mmHg (10/21 1600) SpO2:  [83 %-100 %] 93 % (10/21 1600) Arterial Line BP: (103-137)/(44-101) 119/65 mmHg (10/21 1600) Weight:  [247 lb 2.2 oz (112.1 kg)] 247 lb 2.2 oz (112.1 kg) (10/21 0615) Last BM Date: 02/01/14  Weight change: Filed Weights   01/31/14 0530 02/01/14 0500 02/02/14 0615  Weight: 241 lb 2.9 oz (109.4 kg) 242 lb 15.2 oz (110.2 kg) 247 lb 2.2 oz (112.1 kg)    Intake/Output:   Intake/Output Summary (Last 24 hours) at 02/02/14 1631 Last data filed at 02/02/14 1600  Gross per 24 hour  Intake 2218.4 ml  Output   4053 ml  Net -1834.6 ml     Physical Exam: General:  Elderly. In bed. Confused  HEENT: normal Neck: supple.RIJ trialysis catheter. . Carotids 2+ bilat; no bruits. No lymphadenopathy or thryomegaly appreciated. Cor: PMI nondisplaced. RRR.+ sternal dressing.  Lungs: decreased BS anteriorly R chest tube in place Abdomen: soft, nontender, +distended. No hepatosplenomegaly. No bruits  or masses. Good bowel sounds. Extremities: no cyanosis, clubbing, rash, 3-4+ edema Neuro: alert & orientedx3, cranial nerves grossly intact. moves all 4 extremities w/o difficulty. Affect pleasant  Telemetry:  SR with occasional v-pacing  Labs: Basic Metabolic Panel:  Recent Labs Lab 01/18/2014 2230  01/28/14 0320 01/28/14 1500  01/30/14 1740 01/31/14 0500 02/01/14 0335  02/02/14 0423 02/02/14 0430  02/02/14 1209 02/02/14 1258 02/02/14 1300 02/02/14 1408 02/02/14 1410  NA  --   < > 141  --   < > 134* 131* 131*  < > 129* 130*  < > 132* 134* 135*  131* 133* 131*  K  --   < > 4.2  --   < > 4.1 3.9 3.5*  < > 3.0* 3.2*  < > 2.8* 2.9* 3.2*  2.9* 3.0* 3.0*  CL  --   < > 109  --   < > 98 95* 93*  --   --  88*  --   --   --  86* 83* 85*  CO2  --   --  21  --   < > 19 19 20   --   --  24  --   --   --  31  --   --   GLUCOSE  --   < > 115*  --   < > 114* 97 76  --   --  159*  --   --   --  167* 214* 167*  BUN  --   < >  18  --   < > 42* 46* 55*  --   --  51*  --   --   --  41* 34* 38*  CREATININE 1.12  < > 1.24 1.70*  < > 3.59* 3.71* 4.04*  --   --  3.41*  --   --   --  2.69* 2.40* 2.80*  CALCIUM  --   --  8.3*  --   < > 8.2* 8.0* 8.0*  --   --  9.1  --   --   --  9.9  --   --   MG 3.2*  --  2.9* 2.7*  --   --   --   --   --   --  2.4  --   --   --   --   --   --   PHOS  --   --   --   --   --   --   --   --   --   --  4.5  --   --   --  3.2  --   --   < > = values in this interval not displayed.  Liver Function Tests:  Recent Labs Lab 01/26/2014 0540 02/01/14 0335 02/02/14 0430 02/02/14 1300  AST 28 182* 107*  --   ALT 21 495* 368*  --   ALKPHOS 64 68 75  --   BILITOT 0.8 1.0 1.3*  --   PROT 7.4 5.7* 5.9*  --   ALBUMIN 3.4* 3.1* 3.0*  3.0* 2.9*   No results found for this basename: LIPASE, AMYLASE,  in the last 168 hours No results found for this basename: AMMONIA,  in the last 168 hours  CBC:  Recent Labs Lab 01/30/14 0300 01/30/14 1740 01/31/14 0500 02/01/14 0335   02/02/14 0430  02/02/14 1209 02/02/14 1258 02/02/14 1300 02/02/14 1408 02/02/14 1410  WBC 16.0* 17.5* 17.4* 17.4*  --  16.5*  --   --   --   --   --   --   HGB 7.9* 8.4* 8.1* 8.1*  < > 8.2*  < > 9.5* 10.2* 9.5* 10.2* 9.5*  HCT 23.6* 24.5* 23.5* 23.2*  < > 23.8*  < > 28.0* 30.0* 28.0* 30.0* 28.0*  MCV 89.1 86.9 87.4 87.5  --  89.5  --   --   --   --   --   --   PLT 89* 87* 97* 118*  --  178  --   --   --   --   --   --   < > = values in this interval not displayed.  Cardiac Enzymes:  Recent Labs Lab 01/15/2014 0540 01/28/14 0320 01/28/14 1500 01/29/14 0320 01/29/14 1545  CKTOTAL 52 1183* 553* 363* 347*  CKMB 1.7 219.7* 96.6* 37.1* 20.1*  TROPONINI <0.30 >20.00* >20.00* >20.00* >20.00*    BNP: BNP (last 3 results)  Recent Labs  01/06/14 1305 02/09/2014 0540  PROBNP 2171.0* 1919.0*     Other results:    Imaging: Dg Chest Port 1 View  02/02/2014   CLINICAL DATA:  Evaluate chest tube placement  EXAM: PORTABLE CHEST - 1 VIEW  COMPARISON:  02/02/2014  FINDINGS: Interval placement of right chest tube with significant decrease in volume of right pneumothorax. Right IJ catheter tip is in the SVC. There is a right subclavian catheter with tip in the cavoatrial junction. Left chest wall pacer device is noted with leads in  the right atrial appendage and right ventricle. Heart size appears mildly enlarged. There is mild diffuse edema and a small left pleural effusion.  IMPRESSION: 1. Interval placement of right chest tube with significant decrease in volume of right-sided pneumothorax.   Electronically Signed   By: Kerby Moors M.D.   On: 02/02/2014 09:53   Dg Chest Port 1 View  02/02/2014   CLINICAL DATA:  CHF, NYHA IV, acute on chronic systolic, personal history of hypertension, atrial fibrillation, post CABG and Maze procedure on 02/05/2014  EXAM: PORTABLE CHEST - 1 VIEW  COMPARISON:  Portable exam 0623 hr compared to 02/01/2014  FINDINGS: RIGHT jugular central venous catheter  with tip projecting over SVC.  RIGHT subclavian central venous catheter tip projecting over cavoatrial junction.  Epicardial pacing wires present.  LEFT subclavian sequential pacemaker leads project over RIGHT atrium and RIGHT ventricle.  Enlargement of cardiac silhouette post median sternotomy and CABG.  Atherosclerotic calcification aorta.  Pulmonary vascularity normal.  Bibasilar atelectasis.  RIGHT pneumothorax identified estimated at 20%.  No mediastinal shift.  Remaining lungs clear.  No definite pleural effusion.  Chest wall emphysema at the lateral inferior hemithoraces bilaterally.  IMPRESSION: Approximately 20% RIGHT pneumothorax without mediastinal shift.  Enlargement of cardiac silhouette post CABG and pacemaker.  Bibasilar atelectasis.  Critical Value/emergent results were called by telephone at the time of interpretation on 02/02/2014 at 0822 hr to patient's nurse [place] who verbally acknowledged these results.   Electronically Signed   By: Lavonia Dana M.D.   On: 02/02/2014 08:23   Dg Chest Port 1 View  02/01/2014   CLINICAL DATA:  Lung congestion.  Subsequent encounter.  EXAM: PORTABLE CHEST - 1 VIEW  COMPARISON:  01/30/2014; 01/29/2014  FINDINGS: Grossly unchanged enlarged cardiac silhouette and mediastinal contour post median sternotomy and CABG. Interval removal of left-sided chest tube and mediastinal drain with development of a tiny biapical pneumothoraces. Interval removal of right jugular approach PA catheter with remaining vascular sheath tip projected over the central aspect of the right internal jugular vein. Otherwise, stable position of support apparatus. Pulmonary vasculature is indistinct with cephalization of flow. Unchanged small bilateral effusions with associated bibasilar heterogeneous opacities. No new focal airspace opacities. There is a minimal amount of subcutaneous emphysema about the left lateral chest wall. Unchanged bones.  IMPRESSION: 1. Interval  removal of left-sided chest tube and mediastinal drain with development of a tiny biapical pneumothoraces. Continued attention on follow-up is recommended. 2. Interval removal of PA catheter with remaining vascular sheath tip projected over the central aspect of the right internal jugular vein. 3. Similar findings of pulmonary edema, small bilateral effusions and bibasilar atelectasis. Critical Value/emergent results were called by telephone at the time of interpretation on 02/01/2014 at 8:03 am to [facility], [place], who verbally acknowledged these results.   Electronically Signed   By: Sandi Mariscal M.D.   On: 02/01/2014 08:06   Dg Chest Port 1v Same Day  02/01/2014   CLINICAL DATA:  80 year old male status post central line placement. Initial encounter.  EXAM: PORTABLE CHEST - 1 VIEW SAME DAY  COMPARISON:  0538 hr the same day and earlier.  FINDINGS: Portable AP semi upright view at 1750 hrs. Right IJ central line revised, tip now projects just above the carina.  Subtle left 8 pneumothorax appears decreased. No right pleural edge identified. Mildly improved lung volumes. Stable cardiac size and mediastinal contours. Stable to decreased pulmonary vascular congestion. No definite pleural effusion or consolidation.  IMPRESSION:  1. Right IJ central line tip at the low SVC level. 2. Decreased small left apical pneumothorax. Right pneumothorax no longer visible. 3. No new cardiopulmonary abnormality.   Electronically Signed   By: Lars Pinks M.D.   On: 02/01/2014 18:18     Medications:     Scheduled Medications: . antiseptic oral rinse  7 mL Mouth Rinse BID  . aspirin EC  325 mg Oral Daily   Or  . aspirin  324 mg Per Tube Daily  . atorvastatin  40 mg Oral q1800  . bisacodyl  10 mg Oral Daily   Or  . bisacodyl  10 mg Rectal Daily  . docusate sodium  200 mg Oral Daily  . insulin aspart  0-9 Units Subcutaneous TID WC  . metolazone  2.5 mg Oral BID  . pantoprazole  40 mg Oral Daily  . sodium chloride  10-40 mL  Intracatheter Q12H  . sodium chloride  3 mL Intravenous Q12H    Infusions: . sodium chloride Stopped (02/01/14 2000)  . sodium chloride    . amiodarone 30 mg/hr (02/02/14 1600)  . calcium gluconate infusion for CRRT 20 g (02/01/14 2200)  . DOPamine 3 mcg/kg/min (02/02/14 1600)  . furosemide (LASIX) infusion Stopped (02/01/14 1700)  . lactated ringers Stopped (01/30/14 1400)  . milrinone 0.25 mcg/kg/min (02/02/14 1600)  . norepinephrine (LEVOPHED) Adult infusion 6 mcg/min (02/02/14 1600)  . dialysis replacement fluid (prismasate) 250 mL/hr at 02/01/14 2055  . dialysate (PRISMASATE) 1,000 mL/hr at 02/02/14 1243  . sodium citrate 2 %/dextrose 2.5% solution 3000 mL 250 mL/hr at 02/02/14 0652    PRN Medications: heparin, levalbuterol, metoprolol, morphine injection, ondansetron (ZOFRAN) IV, oxyCODONE, sodium chloride, sodium chloride, sodium chloride, sorbitol, traMADol   Assessment:   1. Coronary artery disease s/p CABG/Maze10/16 2. Perioperative NSTEMI 3. A/c systolic HF with cardiogenic shock 4. iCM EF 30-35% (preop) 5. [name] now NSR s/p Maze 6. Acute on CKD, stage IV   Plan/Discussion:    Now on CVVHD. Has severe volume overload. Hopefully we can increase fluid removal rate today as he is over 40 pounds over his pre-op weight. Wean pressors as tolerated. Continue amio. I will re  Benay Spice 4:31 PM

## 2014-02-02 NOTE — Progress Notes (Signed)
Patient ID: Francisco Merritt, male    DOB: [birth year], 80 y.o.   MRN: [id number]  S: Breathing okay, "feels wheezy".   O:BP 85/40  Pulse 78  Temp(Src) 98.2 F (36.8 C) (Oral)  Resp 18  Ht 5\' 11"  (1.803 m)  Wt 247 lb 2.2 oz (112.1 kg)  BMI 34.48 kg/m2  SpO2 100%  Intake/Output Summary (Last 24 hours) at 02/02/14 1017 Last data filed at 02/02/14 0800  Gross per 24 hour  Intake 2013.6 ml  Output   2530 ml  Net -516.4 ml   Intake/Output: I/O last 3 completed shifts: In: 3068 [P.O.:420; I.V.:2648] Out: 3110 [Urine:1905; Other:1205]  Intake/Output this shift:  Total I/O In: 60 [I.V.:60] Out: 120 [Other:120] Weight change: 4 lb 3 oz (1.9 kg)  General: NAD  HEENT: Right IJ cath in place  CV: RRR. Well healed sternotomy scar.  Resp: clear anteriorly, normal effort  Abd: soft, obese, +BS  Ext: 3+ edema LE and 2+ edema UE.  Neuro: awake, alert x 2, will follow commands.   Recent Labs Lab 02/12/2014 0540  01/29/14 0320 01/29/14 1610 01/30/14 0300 01/30/14 1740 01/31/14 0500 02/01/14 0335 02/01/14 2204 02/01/14 2257 02/02/14 0430  NA 139  < > 138 134* 136* 134* 131* 131* 130* 131* 130*  K 4.4  < > 4.8 4.7 4.3 4.1 3.9 3.5* 3.3* 3.4* 3.2*  CL 104  < > 104 101 102 98 95* 93*  --   --  88*  CO2 22  < > 18* 17* 17* 19 19 20   --   --  24  GLUCOSE 101*  < > 174* 142* 130* 114* 97 76  --   --  159*  BUN 25*  < > 26* 33* 37* 42* 46* 55*  --   --  51*  CREATININE 1.52*  < > 2.24* 2.76* 3.15* 3.59* 3.71* 4.04*  --   --  3.41*  ALBUMIN 3.4*  --   --   --   --   --   --  3.1*  --   --  3.0*  3.0*  CALCIUM 9.5  < > 8.2* 8.3* 8.1* 8.2* 8.0* 8.0*  --   --  9.1  PHOS  --   --   --   --   --   --   --   --   --   --  4.5  AST 28  --   --   --   --   --   --  182*  --   --  107*  ALT 21  --   --   --   --   --   --  495*  --   --  368*  < > = values in this interval not displayed. Liver Function Tests:  Recent Labs Lab 01/21/2014 0540 02/01/14 0335 02/02/14 0430  AST 28 182* 107*   ALT 21 495* 368*  ALKPHOS 64 68 75  BILITOT 0.8 1.0 1.3*  PROT 7.4 5.7* 5.9*  ALBUMIN 3.4* 3.1* 3.0*  3.0*   No results found for this basename: LIPASE, AMYLASE,  in the last 168 hours No results found for this basename: AMMONIA,  in the last 168 hours CBC:  Recent Labs Lab 01/30/14 0300 01/30/14 1740 01/31/14 0500 02/01/14 0335 02/01/14 2204 02/01/14 2257 02/02/14 0430  WBC 16.0* 17.5* 17.4* 17.4*  --   --  16.5*  HGB 7.9* 8.4* 8.1* 8.1* 9.5* 9.5* 8.2*  HCT 23.6* 24.5*  23.5* 23.2* 28.0* 28.0* 23.8*  MCV 89.1 86.9 87.4 87.5  --   --  89.5  PLT 89* 87* 97* 118*  --   --  178   Cardiac Enzymes:  Recent Labs Lab 02/02/2014 0540 01/28/14 0320 01/28/14 1500 01/29/14 0320 01/29/14 1545  CKTOTAL 52 1183* 553* 363* 347*  CKMB 1.7 219.7* 96.6* 37.1* 20.1*  TROPONINI <0.30 >20.00* >20.00* >20.00* >20.00*   CBG:  Recent Labs Lab 02/01/14 0340 02/01/14 0738 02/01/14 1127 02/01/14 1530 02/01/14 2200  GLUCAP 83 73 74 111* 114*    Iron Studies: No results found for this basename: IRON, TIBC, TRANSFERRIN, FERRITIN,  in the last 72 hours Studies/Results: Dg Chest Port 1 View  02/01/2014   CLINICAL DATA:  Lung congestion.  Subsequent encounter.  EXAM: PORTABLE CHEST - 1 VIEW  COMPARISON:  01/30/2014; 01/29/2014  FINDINGS: Grossly unchanged enlarged cardiac silhouette and mediastinal contour post median sternotomy and CABG. Interval removal of left-sided chest tube and mediastinal drain with development of a tiny biapical pneumothoraces. Interval removal of right jugular approach PA catheter with remaining vascular sheath tip projected over the central aspect of the right internal jugular vein. Otherwise, stable position of support apparatus. Pulmonary vasculature is indistinct with cephalization of flow. Unchanged small bilateral effusions with associated bibasilar heterogeneous opacities. No new focal airspace opacities. There is a minimal amount of subcutaneous emphysema about  the left lateral chest wall. Unchanged bones.  IMPRESSION: 1. Interval removal of left-sided chest tube and mediastinal drain with development of a tiny biapical pneumothoraces. Continued attention on follow-up is recommended. 2. Interval removal of PA catheter with remaining vascular sheath tip projected over the central aspect of the right internal jugular vein. 3. Similar findings of pulmonary edema, small bilateral effusions and bibasilar atelectasis. Critical Value/emergent results were called by telephone at the time of interpretation on 02/01/2014 at 8:03 am to [facility], [place], who verbally acknowledged these results.   Electronically Signed   By: Sandi Mariscal M.D.   On: 02/01/2014 08:06   Dg Chest Port 1v Same Day  02/01/2014   CLINICAL DATA:  80 year old male status post central line placement. Initial encounter.  EXAM: PORTABLE CHEST - 1 VIEW SAME DAY  COMPARISON:  0538 hr the same day and earlier.  FINDINGS: Portable AP semi upright view at 1750 hrs. Right IJ central line revised, tip now projects just above the carina.  Subtle left 8 pneumothorax appears decreased. No right pleural edge identified. Mildly improved lung volumes. Stable cardiac size and mediastinal contours. Stable to decreased pulmonary vascular congestion. No definite pleural effusion or consolidation.  IMPRESSION: 1. Right IJ central line tip at the low SVC level. 2. Decreased small left apical pneumothorax. Right pneumothorax no longer visible. 3. No new cardiopulmonary abnormality.   Electronically Signed   By: Lars Pinks M.D.   On: 02/01/2014 18:18   . antiseptic oral rinse  7 mL Mouth Rinse BID  . aspirin EC  325 mg Oral Daily   Or  . aspirin  324 mg Per Tube Daily  . atorvastatin  40 mg Oral q1800  . bisacodyl  10 mg Oral Daily   Or  . bisacodyl  10 mg Rectal Daily  . docusate sodium  200 mg Oral Daily  . insulin aspart  0-9 Units Subcutaneous TID WC  . metolazone  2.5 mg Oral BID  . pantoprazole  40 mg Oral Daily  .  sodium chloride  10-40 mL Intracatheter Q12H  . sodium chloride  3  mL Intravenous Q12H    BMET    Component Value Date/Time   NA 130* 02/02/2014 0430   K 3.2* 02/02/2014 0430   CL 88* 02/02/2014 0430   CO2 24 02/02/2014 0430   GLUCOSE 159* 02/02/2014 0430   BUN 51* 02/02/2014 0430   CREATININE 3.41* 02/02/2014 0430   CALCIUM 9.1 02/02/2014 0430   GFRNONAA 16* 02/02/2014 0430   GFRAA 18* 02/02/2014 0430   CBC    Component Value Date/Time   WBC 16.5* 02/02/2014 0430   RBC 2.66* 02/02/2014 0430   HGB 8.2* 02/02/2014 0430   HCT 23.8* 02/02/2014 0430   PLT 178 02/02/2014 0430   MCV 89.5 02/02/2014 0430   MCH 30.8 02/02/2014 0430   MCHC 34.5 02/02/2014 0430   RDW 16.4* 02/02/2014 0430   LYMPHSABS 1.4 01/06/2014 1305   MONOABS 1.1* 01/06/2014 1305   EOSABS 0.1 01/06/2014 1305   BASOSABS 0.0 01/06/2014 1305     Assessment/Plan: 80 y.o. male with PMH CKD, CAD, [name], CHF, HTN; underwent 3v CABG and MAZE on 54/62 complicated by postop MI requiring pressor and inotrope support. Inadequate diuresis and fluid overload, now receiving CVVHD.   1. Acute on CKD and fluid overload: Cardiorenal. Scr trending upward, on lasix gtt with inadequate UOP and net positive on fluids. CVVHD for fluid removal, increase UF goal 100-150cc/hr.  2. Vascular access: right IJ.  3. Anemia: stable hgb 8.1.  4. Hyponatremia: correct with CVVHD  5. Hypokalemia: additional 6meq kdur today 6. Acute on chronic CHF: on milrinone, dopamine, levophed.  7. CAD s/p 3v CABG on 70/35: complicated by periop MI, drips per above.  8. Afib: pacemaker, MAZE procedure 10/15  9. Transaminitis: hepatic congestion, cardiogenic shock. 10. Pneumothorax: s/p chest tube this AM   Tawanna Sat  Renal Attending.  CRRT continues and we are able to achieve current goal of fluid removal without hemodynamic issues.  K low and we will replace. Will increase ultrafiltration as tolerated to remove fluid safely.  The hyponatremia should  correct with ultrafiltration. Cala Kruckenberg C

## 2014-02-02 NOTE — Progress Notes (Signed)
6 Days Post-Op Procedure(s) (LRB): CORONARY ARTERY BYPASS GRAFTING (CABG), ON PUMP, TIMES THREE, USING LEFT INTERNAL MAMMARY ARTERY, RIGHT GREATER SAPHENOUS VEIN HARVESTED ENDOSCOPICALLY. (N/A) MAZE (N/A) INTRAOPERATIVE TRANSESOPHAGEAL ECHOCARDIOGRAM (N/A) Subjective: Didn't sleep well last night Denies pain and nausea  Objective: Vital signs in last 24 hours: Temp:  [97.3 F (36.3 C)-98.2 F (36.8 C)] 98.2 F (36.8 C) (10/21 0737) Pulse Rate:  [61-95] 78 (10/21 0800) Cardiac Rhythm:  [-] Normal sinus rhythm;Ventricular paced (10/21 0800) Resp:  [12-24] 18 (10/21 0800) BP: (41-121)/(26-86) 85/40 mmHg (10/21 0800) SpO2:  [87 %-100 %] 100 % (10/21 0800) Arterial Line BP: (103-137)/(44-101) 130/51 mmHg (10/21 0800) Weight:  [247 lb 2.2 oz (112.1 kg)] 247 lb 2.2 oz (112.1 kg) (10/21 0615)  Hemodynamic parameters for last 24 hours: CVP:  [12 mmHg-16 mmHg] 14 mmHg  Intake/Output from previous day: 10/20 0701 - 10/21 0700 In: 2058.6 [P.O.:240; I.V.:1818.6] Out: 2535 [EHMCN:4709] Intake/Output this shift: Total I/O In: 95.6 [I.V.:95.6] Out: 160 [Urine:40; Other:120]  General appearance: alert and no distress Neurologic: intact Heart: regular rate and rhythm Lungs: diminished breath sounds bilaterally Abdomen: distended, nontender  Lab Results:  Recent Labs  02/01/14 0335  02/02/14 0430  02/02/14 0759 02/02/14 0800  WBC 17.4*  --  16.5*  --   --   --   HGB 8.1*  < > 8.2*  < > 9.5* 8.8*  HCT 23.2*  < > 23.8*  < > 28.0* 26.0*  PLT 118*  --  178  --   --   --   < > = values in this interval not displayed. BMET:  Recent Labs  02/01/14 0335  02/02/14 0430  02/02/14 0759 02/02/14 0800  NA 131*  < > 130*  < > 133* 130*  K 3.5*  < > 3.2*  < > 2.9* 2.9*  CL 93*  --  88*  --   --   --   CO2 20  --  24  --   --   --   GLUCOSE 76  --  159*  --   --   --   BUN 55*  --  51*  --   --   --   CREATININE 4.04*  --  3.41*  --   --   --   CALCIUM 8.0*  --  9.1  --   --   --   <  > = values in this interval not displayed.  PT/INR: No results found for this basename: LABPROT, INR,  in the last 72 hours ABG    Component Value Date/Time   PHART 7.525* 02/02/2014 0800   HCO3 29.3* 02/02/2014 0800   TCO2 30 02/02/2014 0800   ACIDBASEDEF 1.0 02/02/2014 0013   O2SAT 96.0 02/02/2014 0800   CBG (last 3)   Recent Labs  02/01/14 1530 02/01/14 2200 02/02/14 0735  GLUCAP 111* 114* 162*    Assessment/Plan: S/P Procedure(s) (LRB): CORONARY ARTERY BYPASS GRAFTING (CABG), ON PUMP, TIMES THREE, USING LEFT INTERNAL MAMMARY ARTERY, RIGHT GREATER SAPHENOUS VEIN HARVESTED ENDOSCOPICALLY. (N/A) MAZE (N/A) INTRAOPERATIVE TRANSESOPHAGEAL ECHOCARDIOGRAM (N/A) - CV- maintaining SR, BP tolerating CVVHD  RESP_ sats OK on 4 L [place]  CXR shows a moderate right pneumothorax, he is tolerating it surprisingly well  Will place chest tube  Informed him of indications, risks, benefits and alternatives.  He agrees to right chest tube placement  RENAL- has started CVVHD overnight  Is grossly volume overloaded- will take off more volume today  ENDO- CBG well  controlled  SCD for DVT prophylaxis  Anemia mild follow   LOS: 14 days    HENDRICKSON,STEVEN C 02/02/2014

## 2014-02-03 LAB — RENAL FUNCTION PANEL
ALBUMIN: 2.9 g/dL — AB (ref 3.5–5.2)
ANION GAP: 14 (ref 5–15)
Albumin: 3 g/dL — ABNORMAL LOW (ref 3.5–5.2)
Albumin: 3 g/dL — ABNORMAL LOW (ref 3.5–5.2)
Anion gap: 12 (ref 5–15)
Anion gap: 9 (ref 5–15)
BUN: 31 mg/dL — AB (ref 6–23)
BUN: 26 mg/dL — AB (ref 6–23)
BUN: 24 mg/dL — ABNORMAL HIGH (ref 6–23)
CALCIUM: 9.9 mg/dL (ref 8.4–10.5)
CHLORIDE: 87 meq/L — AB (ref 96–112)
CO2: 38 mEq/L — ABNORMAL HIGH (ref 19–32)
CO2: 42 mEq/L (ref 19–32)
CO2: 43 meq/L — AB (ref 19–32)
Calcium: 10 mg/dL (ref 8.4–10.5)
Calcium: 10.2 mg/dL (ref 8.4–10.5)
Chloride: 85 mEq/L — ABNORMAL LOW (ref 96–112)
Chloride: 87 mEq/L — ABNORMAL LOW (ref 96–112)
Creatinine, Ser: 2.23 mg/dL — ABNORMAL HIGH (ref 0.50–1.35)
Creatinine, Ser: 1.96 mg/dL — ABNORMAL HIGH (ref 0.50–1.35)
Creatinine, Ser: 1.83 mg/dL — ABNORMAL HIGH (ref 0.50–1.35)
GFR calc Af Amer: 35 mL/min — ABNORMAL LOW (ref >90)
GFR calc Af Amer: 38 mL/min — ABNORMAL LOW (ref >90)
GFR calc non Af Amer: 33 mL/min — ABNORMAL LOW (ref >90)
GFR, EST AFRICAN AMERICAN: 30 mL/min — AB (ref >90)
GFR, EST NON AFRICAN AMERICAN: 26 mL/min — AB (ref >90)
GFR, EST NON AFRICAN AMERICAN: 30 mL/min — AB (ref >90)
GLUCOSE: 134 mg/dL — AB (ref 70–99)
Glucose, Bld: 174 mg/dL — ABNORMAL HIGH (ref 70–99)
Glucose, Bld: 182 mg/dL — ABNORMAL HIGH (ref 70–99)
PHOSPHORUS: 2.6 mg/dL (ref 2.3–4.6)
PHOSPHORUS: 2.3 mg/dL (ref 2.3–4.6)
POTASSIUM: 3.2 meq/L — AB (ref 3.7–5.3)
POTASSIUM: 3.6 meq/L — AB (ref 3.7–5.3)
Phosphorus: 2.6 mg/dL (ref 2.3–4.6)
Potassium: 3.8 mEq/L (ref 3.7–5.3)
SODIUM: 139 meq/L (ref 137–147)
SODIUM: 139 meq/L (ref 137–147)
Sodium: 139 mEq/L (ref 137–147)

## 2014-02-03 LAB — POCT I-STAT 3, ART BLOOD GAS (G3+)
Acid-Base Excess: 23 mmol/L — ABNORMAL HIGH (ref 0.0–2.0)
Bicarbonate: 47.6 mEq/L — ABNORMAL HIGH (ref 20.0–24.0)
O2 SAT: 94 %
PCO2 ART: 49.7 mmHg — AB (ref 35.0–45.0)
PO2 ART: 60 mmHg — AB (ref 80.0–100.0)
Patient temperature: 97.4
TCO2: 49 mmol/L (ref 0–100)
pH, Arterial: 7.587 — ABNORMAL HIGH (ref 7.350–7.450)

## 2014-02-03 LAB — POCT I-STAT, CHEM 8
BUN: 25 mg/dL — ABNORMAL HIGH (ref 6–23)
BUN: 36 mg/dL — ABNORMAL HIGH (ref 6–23)
BUN: 22 mg/dL (ref 6–23)
BUN: 25 mg/dL — ABNORMAL HIGH (ref 6–23)
BUN: 22 mg/dL (ref 6–23)
BUN: 24 mg/dL — ABNORMAL HIGH (ref 6–23)
BUN: 21 mg/dL (ref 6–23)
BUN: 23 mg/dL (ref 6–23)
BUN: 21 mg/dL (ref 6–23)
BUN: 24 mg/dL — ABNORMAL HIGH (ref 6–23)
BUN: 20 mg/dL (ref 6–23)
BUN: 23 mg/dL (ref 6–23)
CALCIUM ION: 1.07 mmol/L — AB (ref 1.13–1.30)
CALCIUM ION: 0.79 mmol/L — AB (ref 1.13–1.30)
CALCIUM ION: 0.73 mmol/L — AB (ref 1.13–1.30)
CALCIUM ION: 1.1 mmol/L — AB (ref 1.13–1.30)
CHLORIDE: 79 meq/L — AB (ref 96–112)
CHLORIDE: 80 meq/L — AB (ref 96–112)
CHLORIDE: 80 meq/L — AB (ref 96–112)
CHLORIDE: 80 meq/L — AB (ref 96–112)
CREATININE: 2.2 mg/dL — AB (ref 0.50–1.35)
CREATININE: 1.9 mg/dL — AB (ref 0.50–1.35)
CREATININE: 1.6 mg/dL — AB (ref 0.50–1.35)
CREATININE: 1.9 mg/dL — AB (ref 0.50–1.35)
CREATININE: 1.6 mg/dL — AB (ref 0.50–1.35)
CREATININE: 1.8 mg/dL — AB (ref 0.50–1.35)
Calcium, Ion: 0.39 mmol/L — CL (ref 1.13–1.30)
Calcium, Ion: 0.96 mmol/L — ABNORMAL LOW (ref 1.13–1.30)
Calcium, Ion: 0.4 mmol/L — CL (ref 1.13–1.30)
Calcium, Ion: 0.96 mmol/L — ABNORMAL LOW (ref 1.13–1.30)
Calcium, Ion: 0.85 mmol/L — ABNORMAL LOW (ref 1.13–1.30)
Calcium, Ion: 1.07 mmol/L — ABNORMAL LOW (ref 1.13–1.30)
Calcium, Ion: 0.82 mmol/L — ABNORMAL LOW (ref 1.13–1.30)
Calcium, Ion: 1.12 mmol/L — ABNORMAL LOW (ref 1.13–1.30)
Chloride: 77 mEq/L — ABNORMAL LOW (ref 96–112)
Chloride: 78 mEq/L — ABNORMAL LOW (ref 96–112)
Chloride: 79 mEq/L — ABNORMAL LOW (ref 96–112)
Chloride: 80 mEq/L — ABNORMAL LOW (ref 96–112)
Chloride: 81 mEq/L — ABNORMAL LOW (ref 96–112)
Chloride: 81 mEq/L — ABNORMAL LOW (ref 96–112)
Chloride: 81 mEq/L — ABNORMAL LOW (ref 96–112)
Chloride: 83 mEq/L — ABNORMAL LOW (ref 96–112)
Creatinine, Ser: 1.9 mg/dL — ABNORMAL HIGH (ref 0.50–1.35)
Creatinine, Ser: 1.7 mg/dL — ABNORMAL HIGH (ref 0.50–1.35)
Creatinine, Ser: 1.7 mg/dL — ABNORMAL HIGH (ref 0.50–1.35)
Creatinine, Ser: 2 mg/dL — ABNORMAL HIGH (ref 0.50–1.35)
Creatinine, Ser: 1.7 mg/dL — ABNORMAL HIGH (ref 0.50–1.35)
Creatinine, Ser: 1.9 mg/dL — ABNORMAL HIGH (ref 0.50–1.35)
GLUCOSE: 182 mg/dL — AB (ref 70–99)
GLUCOSE: 236 mg/dL — AB (ref 70–99)
GLUCOSE: 144 mg/dL — AB (ref 70–99)
GLUCOSE: 152 mg/dL — AB (ref 70–99)
GLUCOSE: 133 mg/dL — AB (ref 70–99)
GLUCOSE: 164 mg/dL — AB (ref 70–99)
Glucose, Bld: 185 mg/dL — ABNORMAL HIGH (ref 70–99)
Glucose, Bld: 229 mg/dL — ABNORMAL HIGH (ref 70–99)
Glucose, Bld: 151 mg/dL — ABNORMAL HIGH (ref 70–99)
Glucose, Bld: 137 mg/dL — ABNORMAL HIGH (ref 70–99)
Glucose, Bld: 153 mg/dL — ABNORMAL HIGH (ref 70–99)
Glucose, Bld: 139 mg/dL — ABNORMAL HIGH (ref 70–99)
HCT: 29 % — ABNORMAL LOW (ref 39.0–52.0)
HCT: 32 % — ABNORMAL LOW (ref 39.0–52.0)
HCT: 30 % — ABNORMAL LOW (ref 39.0–52.0)
HCT: 32 % — ABNORMAL LOW (ref 39.0–52.0)
HCT: 30 % — ABNORMAL LOW (ref 39.0–52.0)
HCT: 29 % — ABNORMAL LOW (ref 39.0–52.0)
HCT: 32 % — ABNORMAL LOW (ref 39.0–52.0)
HCT: 31 % — ABNORMAL LOW (ref 39.0–52.0)
HCT: 33 % — ABNORMAL LOW (ref 39.0–52.0)
HCT: 33 % — ABNORMAL LOW (ref 39.0–52.0)
HEMATOCRIT: 29 % — AB (ref 39.0–52.0)
HEMATOCRIT: 30 % — AB (ref 39.0–52.0)
HEMOGLOBIN: 10.2 g/dL — AB (ref 13.0–17.0)
HEMOGLOBIN: 9.9 g/dL — AB (ref 13.0–17.0)
HEMOGLOBIN: 10.5 g/dL — AB (ref 13.0–17.0)
Hemoglobin: 10.9 g/dL — ABNORMAL LOW (ref 13.0–17.0)
Hemoglobin: 9.9 g/dL — ABNORMAL LOW (ref 13.0–17.0)
Hemoglobin: 10.2 g/dL — ABNORMAL LOW (ref 13.0–17.0)
Hemoglobin: 10.9 g/dL — ABNORMAL LOW (ref 13.0–17.0)
Hemoglobin: 10.9 g/dL — ABNORMAL LOW (ref 13.0–17.0)
Hemoglobin: 9.9 g/dL — ABNORMAL LOW (ref 13.0–17.0)
Hemoglobin: 11.2 g/dL — ABNORMAL LOW (ref 13.0–17.0)
Hemoglobin: 10.2 g/dL — ABNORMAL LOW (ref 13.0–17.0)
Hemoglobin: 11.2 g/dL — ABNORMAL LOW (ref 13.0–17.0)
POTASSIUM: 2.9 meq/L — AB (ref 3.7–5.3)
POTASSIUM: 3.2 meq/L — AB (ref 3.7–5.3)
POTASSIUM: 3.5 meq/L — AB (ref 3.7–5.3)
POTASSIUM: 3.5 meq/L — AB (ref 3.7–5.3)
POTASSIUM: 3.6 meq/L — AB (ref 3.7–5.3)
POTASSIUM: 3.4 meq/L — AB (ref 3.7–5.3)
POTASSIUM: 3.5 meq/L — AB (ref 3.7–5.3)
Potassium: 3.2 mEq/L — ABNORMAL LOW (ref 3.7–5.3)
Potassium: 3.3 mEq/L — ABNORMAL LOW (ref 3.7–5.3)
Potassium: 3.6 mEq/L — ABNORMAL LOW (ref 3.7–5.3)
Potassium: 3.6 mEq/L — ABNORMAL LOW (ref 3.7–5.3)
Potassium: 3.6 mEq/L — ABNORMAL LOW (ref 3.7–5.3)
SODIUM: 136 meq/L — AB (ref 137–147)
SODIUM: 137 meq/L (ref 137–147)
SODIUM: 138 meq/L (ref 137–147)
Sodium: 134 mEq/L — ABNORMAL LOW (ref 137–147)
Sodium: 136 mEq/L — ABNORMAL LOW (ref 137–147)
Sodium: 136 mEq/L — ABNORMAL LOW (ref 137–147)
Sodium: 137 mEq/L (ref 137–147)
Sodium: 138 mEq/L (ref 137–147)
Sodium: 137 mEq/L (ref 137–147)
Sodium: 138 mEq/L (ref 137–147)
Sodium: 137 mEq/L (ref 137–147)
Sodium: 138 mEq/L (ref 137–147)
TCO2: 37 mmol/L (ref 0–100)
TCO2: 46 mmol/L (ref 0–100)
TCO2: 39 mmol/L (ref 0–100)
TCO2: 43 mmol/L (ref 0–100)
TCO2: 43 mmol/L (ref 0–100)
TCO2: 44 mmol/L (ref 0–100)
TCO2: 41 mmol/L (ref 0–100)
TCO2: 43 mmol/L (ref 0–100)
TCO2: 41 mmol/L (ref 0–100)
TCO2: 44 mmol/L (ref 0–100)
TCO2: 40 mmol/L (ref 0–100)
TCO2: 42 mmol/L (ref 0–100)

## 2014-02-03 LAB — GLUCOSE, CAPILLARY
GLUCOSE-CAPILLARY: 152 mg/dL — AB (ref 70–99)
Glucose-Capillary: 156 mg/dL — ABNORMAL HIGH (ref 70–99)
Glucose-Capillary: 130 mg/dL — ABNORMAL HIGH (ref 70–99)

## 2014-02-03 LAB — CBC
HCT: 29.4 % — ABNORMAL LOW (ref 39.0–52.0)
Hemoglobin: 9.3 g/dL — ABNORMAL LOW (ref 13.0–17.0)
MCH: 29.1 pg (ref 26.0–34.0)
MCHC: 31.6 g/dL (ref 30.0–36.0)
MCV: 91.9 fL (ref 78.0–100.0)
PLATELETS: 233 10*3/uL (ref 150–400)
RBC: 3.2 MIL/uL — ABNORMAL LOW (ref 4.22–5.81)
RDW: 17.1 % — ABNORMAL HIGH (ref 11.5–15.5)
WBC: 17.3 10*3/uL — AB (ref 4.0–10.5)

## 2014-02-03 LAB — CARBOXYHEMOGLOBIN
CARBOXYHEMOGLOBIN: 1.8 % — AB (ref 0.5–1.5)
METHEMOGLOBIN: 1.1 % (ref 0.0–1.5)
O2 Saturation: 40.6 %
Total hemoglobin: 9.9 g/dL — ABNORMAL LOW (ref 13.5–18.0)

## 2014-02-03 LAB — MAGNESIUM: Magnesium: 2 mg/dL (ref 1.5–2.5)

## 2014-02-03 MED ORDER — POTASSIUM CHLORIDE CRYS ER 20 MEQ PO TBCR
40.0000 meq | EXTENDED_RELEASE_TABLET | Freq: Once | ORAL | Status: AC
  Administered 2014-02-03: 40 meq via ORAL
  Filled 2014-02-03: qty 2

## 2014-02-03 MED ORDER — DEXTROSE 5 % IV SOLN
20.0000 g | INTRAVENOUS | Status: DC
  Filled 2014-02-03: qty 200

## 2014-02-03 MED ORDER — POTASSIUM CHLORIDE 10 MEQ/100ML IV SOLN: 10.0000 meq | INTRAVENOUS | Status: DC

## 2014-02-03 MED ORDER — PRISMASOL BGK 4/2.5 32-4-2.5 MEQ/L IV SOLN
INTRAVENOUS | Status: DC
  Administered 2014-02-03 – 2014-02-05 (×2): via INTRAVENOUS_CENTRAL
  Filled 2014-02-03 (×5): qty 5000

## 2014-02-03 MED ORDER — POTASSIUM CHLORIDE 10 MEQ/50ML IV SOLN
10.0000 meq | INTRAVENOUS | Status: AC
  Administered 2014-02-03 (×6): 10 meq via INTRAVENOUS
  Filled 2014-02-03 (×6): qty 50

## 2014-02-03 MED ORDER — METOCLOPRAMIDE HCL 5 MG/5ML PO SOLN
5.0000 mg | Freq: Three times a day (TID) | ORAL | Status: DC
  Administered 2014-02-03 – 2014-02-06 (×10): 5 mg via ORAL
  Filled 2014-02-03 (×12): qty 5

## 2014-02-03 MED ORDER — AMIODARONE LOAD VIA INFUSION
150.0000 mg | Freq: Once | INTRAVENOUS | Status: AC
  Administered 2014-02-03: 150 mg via INTRAVENOUS

## 2014-02-03 MED ORDER — POTASSIUM CHLORIDE 20 MEQ/15ML (10%) PO LIQD: 20.0000 meq | Freq: Once | ORAL | Status: DC

## 2014-02-03 NOTE — Progress Notes (Addendum)
[address]       [place] [address]             [phone]      7 Days Post-Op Procedure(s) (LRB): CORONARY ARTERY BYPASS GRAFTING (CABG), ON PUMP, TIMES THREE, USING LEFT INTERNAL MAMMARY ARTERY, RIGHT GREATER SAPHENOUS VEIN HARVESTED ENDOSCOPICALLY. (N/A) MAZE (N/A) INTRAOPERATIVE TRANSESOPHAGEAL ECHOCARDIOGRAM (N/A)  Subjective:  [name] has no specific complaints.  States he is doing okay.  Currently undergo CVVHD Objective: Vital signs in last 24 hours: Temp:  [97.2 F (36.2 C)-98.9 F (37.2 C)] 98.1 F (36.7 C) (10/22 0734) Pulse Rate:  [41-154] 109 (10/22 0700) Cardiac Rhythm:  [-] Normal sinus rhythm (10/22 0400) Resp:  [13-34] 13 (10/22 0700) BP: (71-137)/(32-86) 103/69 mmHg (10/22 0700) SpO2:  [83 %-100 %] 95 % (10/22 0700) Arterial Line BP: (94-252)/(42-247) 110/46 mmHg (10/22 0700) Weight:  [235 lb 14.3 oz (107 kg)] 235 lb 14.3 oz (107 kg) (10/22 0500)  Hemodynamic parameters for last 24 hours: CVP:  [11 mmHg-21 mmHg] 15 mmHg  Intake/Output from previous day: 10/21 0701 - 10/22 0700 In: 2458.8 [P.O.:200; I.V.:2258.8] Out: 0998 [Urine:400; Chest Tube:760]  General appearance: alert, cooperative and no distress Heart: regular rate and rhythm Lungs: wheezes bilaterally Abdomen: soft, non-tender; bowel sounds normal; no masses,  no organomegaly Extremities: edema 2-3+ Wound: clean and dry, oozing from [facility] sites due to edema in LE  Lab Results:  Recent Labs  02/02/14 0430  02/03/14 0400 02/03/14 0510 02/03/14 0511  WBC 16.5*  --  17.3*  --   --   HGB 8.2*  < > 9.3* 10.9* 9.9*  HCT 23.8*  < > 29.4* 32.0* 29.0*  PLT 178  --  233  --   --   < > = values in this interval not displayed. BMET:  Recent Labs  02/02/14 2100  02/03/14 0400 02/03/14 0510 02/03/14 0511  NA 135*  < > 139 136* 134*  K 3.3*  < > 3.2* 2.9* 3.2*  CL 85*  < > 87* 79* 80*  CO2 34*  --  38*  --   --   GLUCOSE 161*  < > 174* 236* 182*  BUN 36*  <  > 31* 25* 36*  CREATININE 2.47*  < > 2.23* 1.90* 2.20*  CALCIUM 9.9  --  10.0  --   --   < > = values in this interval not displayed.  PT/INR: No results found for this basename: LABPROT, INR,  in the last 72 hours ABG    Component Value Date/Time   PHART 7.545* 02/02/2014 1300   HCO3 32.5* 02/02/2014 1300   TCO2 46 02/03/2014 0511   ACIDBASEDEF 1.0 02/02/2014 0013   O2SAT 40.6 02/03/2014 0406   CBG (last 3)   Recent Labs  02/02/14 1213 02/02/14 1609 02/02/14 2159  GLUCAP 153* 172* 179*    Assessment/Plan: S/P Procedure(s) (LRB): CORONARY ARTERY BYPASS GRAFTING (CABG), ON PUMP, TIMES THREE, USING LEFT INTERNAL MAMMARY ARTERY, RIGHT GREATER SAPHENOUS VEIN HARVESTED ENDOSCOPICALLY. (N/A) MAZE (N/A) INTRAOPERATIVE TRANSESOPHAGEAL ECHOCARDIOGRAM (N/A)  1. CV- maintaining NSR, BP stable- advance HF weaning Amiodarone, Dopamine, Milrinone, and Levophed as tolerated 2. Pulm- wean oxygen as tolerated, some wheezing bilaterally likely due to hypervolemia, CXR placed for pneumothorax yesterday will leave on suction today 3. Renal- CKD Stg III, Nephrology following, currently undergoing CVVHD, Hypokalemic 4. CBGs, controlled continue current regimen, patient borderline diabetic Pre Op A1c is 6.0 5. Dispo- patient stable, continue to wean  drips as tolerated, Dialysis per Neprho, will get repeat CXR in AM  LOS: 15 days    Merritt, Francisco 02/03/2014  Patient seen and examined He is a little more lethargic today He is tolerating CVVHD well with net 3L negative yesterday- He is down 12 pounds but still up 30 from preop Remains on dopamine and milrinone, on low dose levophed- titrating to maintain BP

## 2014-02-03 NOTE — Progress Notes (Signed)
Physical Therapy Treatment Patient Details Name: Francisco Merritt MRN: [id number] DOB: 08-04-1929 Today's Date: 02/03/2014    History of Present Illness Francisco Merritt is an 80 year old with a history of obesity, hypertension, Afib, tachybrady syndrome, s/p pacemaker and currently s/p CABG with complication of periop MI and acute renal failure.    PT Comments    Pt admitted with above. Pt currently with functional limitations due to balance and endurance deficits.  Only able to perform exercises due to CRRT.  Pt was actively assisting with exercise.   Pt will benefit from skilled PT to increase their independence and safety with mobility to allow discharge to the venue listed below.   Follow Up Recommendations  SNF;Supervision/Assistance - 24 hour     Equipment Recommendations  None recommended by PT    Recommendations for Other Services OT consult     Precautions / Restrictions Precautions Precautions: Fall;Sternal Precaution Comments: Pt has already had one fall in the ED, near fall with PT during eval.  Restrictions Weight Bearing Restrictions:  (sternal precautions apply)    Mobility  Bed Mobility Overal bed mobility: Needs Assistance;+2 for physical assistance             General bed mobility comments: Moved pt up in bed for positioning with total assist.  Propped with pillows.    Transfers                    Ambulation/Gait                 Stairs            Wheelchair Mobility    Modified Rankin (Stroke Patients Only)       Balance                                    Cognition Arousal/Alertness: Lethargic;Suspect due to medications Behavior During Therapy: [facility] for tasks assessed/performed Overall Cognitive Status: Impaired/Different from baseline Area of Impairment: Problem solving     Memory: Decreased recall of precautions       Problem Solving: Slow processing;Decreased initiation      Exercises  General Exercises - Upper Extremity Shoulder Flexion: AAROM;Right;10 reps;Supine Shoulder ABduction: AAROM;Right;10 reps;Supine Elbow Flexion: AAROM;Right;10 reps;Supine General Exercises - Lower Extremity Ankle Circles/Pumps: AAROM;Both;10 reps;Supine Quad Sets: AROM;Both;10 reps;Supine Heel Slides: AAROM;Both;10 reps;Supine Hip ABduction/ADduction: AAROM;Both;10 reps;Supine Straight Leg Raises: AAROM;Both;10 reps;Supine    General Comments        Pertinent Vitals/Pain Pain Assessment: No/denies pain BP 106/60 with MAP 62, HR 83 bpm.    Home Living                      Prior Function            PT Goals (current goals can now be found in the care plan section) Progress towards PT goals: Not progressing toward goals - comment (Pt on CRRT - exercise only)    Frequency  Min 3X/week    PT Plan Current plan remains appropriate    Co-evaluation             End of Session   Activity Tolerance: Patient limited by fatigue;Patient limited by lethargy Patient left: in bed;with call bell/phone within reach;with nursing/sitter in room;with restraints reapplied     Time: 5956-3875 PT Time Calculation (min): 13 min  Charges:  $Therapeutic Exercise: 8-22 mins  G CodesDenice Paradise 02/03/2014, 10:18 AM [facility] [phone] (pager)

## 2014-02-03 NOTE — Progress Notes (Signed)
Received lab result CO2 42 results read back. Notified Dr. Florene Glen. Orders received to decrease Sodium Citrate and Calcium Gluconate by half.

## 2014-02-03 NOTE — Progress Notes (Signed)
CT surgery p.m. Rounds  Patient resting comfortably in bed Paced rhythm, stable blood pressure greater than 207 systolic Norepinephrine is been weaned off CVVH removing 100-150 cc per hour Stable day

## 2014-02-03 NOTE — Progress Notes (Signed)
1. Acute on CKD and fluid overload: Postop MI and cardiogenic shock            Plan: Cont CRRT, K replace again, fluid removal as tolerated. 2. Vascular access: right IJ.  3. Anemia: stable hgb 8.1.  4. Hyponatremia: correct with CVVHD  5. Hypokalemia: additional 5meq kdur today 6. Acute on chronic CHF: on milrinone, dopamine, levophed.  7. CAD s/p 3v CABG on 78/29: complicated by periop MI, drips per above.  8. Afib: pacemaker, MAZE procedure 10/15  9. Transaminitis: hepatic congestion, cardiogenic shock. 10. Pneumothorax: s/p chest tube this AM  Subjective: Interval History: Net fluid loss of  3000cc yesterday  Objective: Vital signs in last 24 hours: Temp:  [97.2 F (36.2 Merritt)-98.9 F (37.2 Merritt)] 98.1 F (36.7 Merritt) (10/22 0734) Pulse Rate:  [41-154] 83 (10/22 0800) Resp:  [13-34] 15 (10/22 0800) BP: (71-137)/(32-86) 105/60 mmHg (10/22 0800) SpO2:  [83 %-100 %] 97 % (10/22 0800) Arterial Line BP: (94-252)/(42-247) 120/55 mmHg (10/22 0800) Weight:  [107 kg (235 lb 14.3 oz)] 107 kg (235 lb 14.3 oz) (10/22 0500) Weight change: -5.1 kg (-11 lb 3.9 oz)  Intake/Output from previous day: 10/21 0701 - 10/22 0700 In: 2458.8 [P.O.:200; I.V.:2258.8] Out: 5621 [Urine:400; Chest Tube:760] Intake/Output this shift: Total I/O In: 124.7 [I.V.:124.7] Out: 209 [Urine:10; Other:199]  General appearance: sleepy, not conversant Resp: poor effort Chest wall: sternotomy scar Cardio: regular rate and rhythm, S1, S2 normal, no murmur, click, rub or gallop Extremities: bandages bilat with 1 + edema  Lab Results:  Recent Labs  02/02/14 0430  02/03/14 0400 02/03/14 0510 02/03/14 0511  WBC 16.5*  --  17.3*  --   --   HGB 8.2*  < > 9.3* 10.9* 9.9*  HCT 23.8*  < > 29.4* 32.0* 29.0*  PLT 178  --  233  --   --   < > = values in this interval not displayed. BMET:  Recent Labs  02/02/14 2100  02/03/14 0400 02/03/14 0510 02/03/14 0511  NA 135*  < > 139 136* 134*  K 3.3*  < > 3.2* 2.9* 3.2*   CL 85*  < > 87* 79* 80*  CO2 34*  --  38*  --   --   GLUCOSE 161*  < > 174* 236* 182*  BUN 36*  < > 31* 25* 36*  CREATININE 2.47*  < > 2.23* 1.90* 2.20*  CALCIUM 9.9  --  10.0  --   --   < > = values in this interval not displayed. No results found for this basename: PTH,  in the last 72 hours Iron Studies: No results found for this basename: IRON, TIBC, TRANSFERRIN, FERRITIN,  in the last 72 hours Studies/Results: Dg Chest Port 1 View  02/02/2014   CLINICAL DATA:  Evaluate chest tube placement  EXAM: PORTABLE CHEST - 1 VIEW  COMPARISON:  02/02/2014  FINDINGS: Interval placement of right chest tube with significant decrease in volume of right pneumothorax. Right IJ catheter tip is in the SVC. There is a right subclavian catheter with tip in the cavoatrial junction. Left chest wall pacer device is noted with leads in the right atrial appendage and right ventricle. Heart size appears mildly enlarged. There is mild diffuse edema and a small left pleural effusion.  IMPRESSION: 1. Interval placement of right chest tube with significant decrease in volume of right-sided pneumothorax.   Electronically Signed   By: Kerby Moors M.D.   On: 02/02/2014 09:53   Dg  Chest Port 1 View  02/02/2014   CLINICAL DATA:  CHF, NYHA IV, acute on chronic systolic, personal history of hypertension, atrial fibrillation, post CABG and Maze procedure on 01/13/2014  EXAM: PORTABLE CHEST - 1 VIEW  COMPARISON:  Portable exam 0623 hr compared to 02/01/2014  FINDINGS: RIGHT jugular central venous catheter with tip projecting over SVC.  RIGHT subclavian central venous catheter tip projecting over cavoatrial junction.  Epicardial pacing wires present.  LEFT subclavian sequential pacemaker leads project over RIGHT atrium and RIGHT ventricle.  Enlargement of cardiac silhouette post median sternotomy and CABG.  Atherosclerotic calcification aorta.  Pulmonary vascularity normal.  Bibasilar atelectasis.  RIGHT pneumothorax identified  estimated at 20%.  No mediastinal shift.  Remaining lungs clear.  No definite pleural effusion.  Chest wall emphysema at the lateral inferior hemithoraces bilaterally.  IMPRESSION: Approximately 20% RIGHT pneumothorax without mediastinal shift.  Enlargement of cardiac silhouette post CABG and pacemaker.  Bibasilar atelectasis.  Critical Value/emergent results were called by telephone at the time of interpretation on 02/02/2014 at 0822 hr to patient's nurse [place] on [place] who verbally acknowledged these results.   Electronically Signed   By: Lavonia Dana M.D.   On: 02/02/2014 08:23   Dg Chest Port 1v Same Day  02/01/2014   CLINICAL DATA:  80 year old male status post central line placement. Initial encounter.  EXAM: PORTABLE CHEST - 1 VIEW SAME DAY  COMPARISON:  0538 hr the same day and earlier.  FINDINGS: Portable AP semi upright view at 1750 hrs. Right IJ central line revised, tip now projects just above the carina.  Subtle left 8 pneumothorax appears decreased. No right pleural edge identified. Mildly improved lung volumes. Stable cardiac size and mediastinal contours. Stable to decreased pulmonary vascular congestion. No definite pleural effusion or consolidation.  IMPRESSION: 1. Right IJ central line tip at the low SVC level. 2. Decreased small left apical pneumothorax. Right pneumothorax no longer visible. 3. No new cardiopulmonary abnormality.   Electronically Signed   By: Lars Pinks M.D.   On: 02/01/2014 18:18    Scheduled: . antiseptic oral rinse  7 mL Mouth Rinse BID  . aspirin EC  325 mg Oral Daily   Or  . aspirin  324 mg Per Tube Daily  . atorvastatin  40 mg Oral q1800  . bisacodyl  10 mg Oral Daily   Or  . bisacodyl  10 mg Rectal Daily  . docusate sodium  200 mg Oral Daily  . insulin aspart  0-9 Units Subcutaneous TID WC  . metoCLOPramide  5 mg Oral TID AC  . pantoprazole  40 mg Oral Daily  . sodium chloride  10-40 mL Intracatheter Q12H  . sodium chloride  3 mL Intravenous  Q12H       LOS: 15 days   Francisco Merritt 02/03/2014,8:55 AM

## 2014-02-03 NOTE — Progress Notes (Signed)
Advanced Heart Failure Rounding Note   Subjective:     Francisco Merritt is a 80 y.o. male with a history of obesity, hypertension, CAD, paroxysmal atrial fibrillation, systolic HF EF 16-10%, tachybradycardia syndrome and is status post pacemaker  Underwent R/L cath 10/7.  Severe 2V CAD.  Underwent CABG and Maze on 10/15. (left internal mammary artery to left anterior  descending, saphenous vein graft to first diagonal and OM1);   Suffered perioperative MI with trop > 20.   Trialysis catheter placed and CVVHD started. Tolerating fluid removal well. Now at 240/hr. Weight down almost 10 pounds.   Confused this am but can follow basic commands. Remains in NSR on amio. Remains on pressors.   Confused today.   Echo with poor images. LVEF "mild to moderately" depressed.    Objective:   Weight Range:  Vital Signs:   Temp:  [97.2 F (36.2 C)-98.9 F (37.2 C)] 98.1 F (36.7 C) (10/22 0734) Pulse Rate:  [41-117] 83 (10/22 1000) Resp:  [13-34] 15 (10/22 1000) BP: (71-137)/(32-86) 105/52 mmHg (10/22 1000) SpO2:  [83 %-100 %] 100 % (10/22 1000) Arterial Line BP: (94-252)/(42-247) 127/55 mmHg (10/22 1000) Weight:  [235 lb 14.3 oz (107 kg)] 235 lb 14.3 oz (107 kg) (10/22 0500) Last BM Date: 02/01/14  Weight change: Filed Weights   02/01/14 0500 02/02/14 0615 02/03/14 0500  Weight: 242 lb 15.2 oz (110.2 kg) 247 lb 2.2 oz (112.1 kg) 235 lb 14.3 oz (107 kg)    Intake/Output:   Intake/Output Summary (Last 24 hours) at 02/03/14 1035 Last data filed at 02/03/14 1011  Gross per 24 hour  Intake 2484.3 ml  Output   5556 ml  Net -3071.7 ml     Physical Exam: General:  Elderly. In bed. Confused  HEENT: normal Neck: supple.RIJ trialysis catheter. . Carotids 2+ bilat; no bruits. No lymphadenopathy or thryomegaly appreciated. Cor: PMI nondisplaced. RRR.+ sternal dressing.  Lungs: decreased BS anteriorly R chest tube in place Abdomen: soft, nontender, +distended. No hepatosplenomegaly. No  bruits or masses. Good bowel sounds. Extremities: no cyanosis, clubbing, rash, 3-4+ edema Neuro: awake, confused. Follows basic commands. cranial nerves grossly intact. moves all 4 extremities w/o difficulty.   Telemetry:  SR with occasional v-pacing  Labs: Basic Metabolic Panel:  Recent Labs Lab 02/01/2014 2230  01/28/14 0320 01/28/14 1500  02/01/14 0335  02/02/14 0423 02/02/14 0430  02/02/14 1300  02/02/14 2100 02/02/14 2209 02/02/14 2211 02/03/14 0400 02/03/14 0510 02/03/14 0511  NA  --   < > 141  --   < > 131*  < > 129* 130*  < > 135*  131*  < > 135* 135* 133* 139 136* 134*  K  --   < > 4.2  --   < > 3.5*  < > 3.0* 3.2*  < > 3.2*  2.9*  < > 3.3* 3.1* 3.1* 3.2* 2.9* 3.2*  CL  --   < > 109  --   < > 93*  --   --  88*  --  86*  < > 85* 80* 82* 87* 79* 80*  CO2  --   --  21  --   < > 20  --   --  24  --  31  --  34*  --   --  38*  --   --   GLUCOSE  --   < > 115*  --   < > 76  --   --  159*  --  167*  < > 161* 229* 177* 174* 236* 182*  BUN  --   < > 18  --   < > 55*  --   --  51*  --  41*  < > 36* 29* 33* 31* 25* 36*  CREATININE 1.12  < > 1.24 1.70*  < > 4.04*  --   --  3.41*  --  2.69*  < > 2.47* 2.00* 2.40* 2.23* 1.90* 2.20*  CALCIUM  --   --  8.3*  --   < > 8.0*  --   --  9.1  --  9.9  --  9.9  --   --  10.0  --   --   MG 3.2*  --  2.9* 2.7*  --   --   --   --  2.4  --   --   --   --   --   --  2.0  --   --   PHOS  --   --   --   --   --   --   --   --  4.5  --  3.2  --  2.9  --   --  2.6  --   --   < > = values in this interval not displayed.  Liver Function Tests:  Recent Labs Lab 02/01/14 0335 02/02/14 0430 02/02/14 1300 02/02/14 2100 02/03/14 0400  AST 182* 107*  --   --   --   ALT 495* 368*  --   --   --   ALKPHOS 68 75  --   --   --   BILITOT 1.0 1.3*  --   --   --   PROT 5.7* 5.9*  --   --   --   ALBUMIN 3.1* 3.0*  3.0* 2.9* 2.9* 2.9*   No results found for this basename: LIPASE, AMYLASE,  in the last 168 hours No results found for this basename:  AMMONIA,  in the last 168 hours  CBC:  Recent Labs Lab 01/30/14 1740 01/31/14 0500 02/01/14 0335  02/02/14 0430  02/02/14 2209 02/02/14 2211 02/03/14 0400 02/03/14 0510 02/03/14 0511  WBC 17.5* 17.4* 17.4*  --  16.5*  --   --   --  17.3*  --   --   HGB 8.4* 8.1* 8.1*  < > 8.2*  < > 10.5* 9.9* 9.3* 10.9* 9.9*  HCT 24.5* 23.5* 23.2*  < > 23.8*  < > 31.0* 29.0* 29.4* 32.0* 29.0*  MCV 86.9 87.4 87.5  --  89.5  --   --   --  91.9  --   --   PLT 87* 97* 118*  --  178  --   --   --  233  --   --   < > = values in this interval not displayed.  Cardiac Enzymes:  Recent Labs Lab 01/28/14 0320 01/28/14 1500 01/29/14 0320 01/29/14 1545  CKTOTAL 1183* 553* 363* 347*  CKMB 219.7* 96.6* 37.1* 20.1*  TROPONINI >20.00* >20.00* >20.00* >20.00*    BNP: BNP (last 3 results)  Recent Labs  01/06/14 1305 01/17/2014 0540  PROBNP 2171.0* 1919.0*     Other results:    Imaging: Dg Chest Port 1 View  02/02/2014   CLINICAL DATA:  Evaluate chest tube placement  EXAM: PORTABLE CHEST - 1 VIEW  COMPARISON:  02/02/2014  FINDINGS: Interval placement of right chest tube with significant decrease in volume of right pneumothorax. Right  IJ catheter tip is in the SVC. There is a right subclavian catheter with tip in the cavoatrial junction. Left chest wall pacer device is noted with leads in the right atrial appendage and right ventricle. Heart size appears mildly enlarged. There is mild diffuse edema and a small left pleural effusion.  IMPRESSION: 1. Interval placement of right chest tube with significant decrease in volume of right-sided pneumothorax.   Electronically Signed   By: Kerby Moors M.D.   On: 02/02/2014 09:53   Dg Chest Port 1 View  02/02/2014   CLINICAL DATA:  CHF, NYHA IV, acute on chronic systolic, personal history of hypertension, atrial fibrillation, post CABG and Maze procedure on 02/02/2014  EXAM: PORTABLE CHEST - 1 VIEW  COMPARISON:  Portable exam 0623 hr compared to 02/01/2014   FINDINGS: RIGHT jugular central venous catheter with tip projecting over SVC.  RIGHT subclavian central venous catheter tip projecting over cavoatrial junction.  Epicardial pacing wires present.  LEFT subclavian sequential pacemaker leads project over RIGHT atrium and RIGHT ventricle.  Enlargement of cardiac silhouette post median sternotomy and CABG.  Atherosclerotic calcification aorta.  Pulmonary vascularity normal.  Bibasilar atelectasis.  RIGHT pneumothorax identified estimated at 20%.  No mediastinal shift.  Remaining lungs clear.  No definite pleural effusion.  Chest wall emphysema at the lateral inferior hemithoraces bilaterally.  IMPRESSION: Approximately 20% RIGHT pneumothorax without mediastinal shift.  Enlargement of cardiac silhouette post CABG and pacemaker.  Bibasilar atelectasis.  Critical Value/emergent results were called by telephone at the time of interpretation on 02/02/2014 at 0822 hr to patient's nurse [place] on [place] who verbally acknowledged these results.   Electronically Signed   By: Lavonia Dana M.D.   On: 02/02/2014 08:23   Dg Chest Port 1v Same Day  02/01/2014   CLINICAL DATA:  80 year old male status post central line placement. Initial encounter.  EXAM: PORTABLE CHEST - 1 VIEW SAME DAY  COMPARISON:  0538 hr the same day and earlier.  FINDINGS: Portable AP semi upright view at 1750 hrs. Right IJ central line revised, tip now projects just above the carina.  Subtle left 8 pneumothorax appears decreased. No right pleural edge identified. Mildly improved lung volumes. Stable cardiac size and mediastinal contours. Stable to decreased pulmonary vascular congestion. No definite pleural effusion or consolidation.  IMPRESSION: 1. Right IJ central line tip at the low SVC level. 2. Decreased small left apical pneumothorax. Right pneumothorax no longer visible. 3. No new cardiopulmonary abnormality.   Electronically Signed   By: Lars Pinks M.D.   On: 02/01/2014 18:18      Medications:     Scheduled Medications: . antiseptic oral rinse  7 mL Mouth Rinse BID  . aspirin EC  325 mg Oral Daily   Or  . aspirin  324 mg Per Tube Daily  . atorvastatin  40 mg Oral q1800  . bisacodyl  10 mg Oral Daily   Or  . bisacodyl  10 mg Rectal Daily  . docusate sodium  200 mg Oral Daily  . insulin aspart  0-9 Units Subcutaneous TID WC  . metoCLOPramide  5 mg Oral TID AC  . pantoprazole  40 mg Oral Daily  . potassium chloride  10 mEq Intravenous Q1 Hr x 6  . sodium chloride  10-40 mL Intracatheter Q12H  . sodium chloride  3 mL Intravenous Q12H    Infusions: . sodium chloride Stopped (02/01/14 2000)  . sodium chloride    . amiodarone 30 mg/hr (02/03/14 1000)  .  calcium gluconate infusion for CRRT 20 g (02/03/14 1000)  . DOPamine 3 mcg/kg/min (02/03/14 1000)  . lactated ringers Stopped (01/30/14 1400)  . milrinone 0.25 mcg/kg/min (02/03/14 1000)  . norepinephrine (LEVOPHED) Adult infusion 4 mcg/min (02/03/14 1000)  . dialysis replacement fluid (prismasate) 250 mL/hr at 02/02/14 1744  . dialysate (PRISMASATE) 1,000 mL/hr at 02/03/14 0937  . sodium citrate 2 %/dextrose 2.5% solution 3000 mL 450 mL/hr at 02/03/14 0518    PRN Medications: heparin, levalbuterol, metoprolol, morphine injection, ondansetron (ZOFRAN) IV, oxyCODONE, sodium chloride, sodium chloride, sodium chloride, sorbitol, traMADol   Assessment:   1. Coronary artery disease s/p CABG/Maze10/16 2. Perioperative NSTEMI 3. A/c systolic HF with cardiogenic shock 4. iCM EF 30-35% (preop) 5. [name] now NSR s/p Maze 6. Acute on CKD, stage IV   Plan/Discussion:    Now on CVVHD. Weight down 8 pounds. Remains on pressors.   [name],[name], NP-C  10:35 AM  Patient seen and examined with Darrick Grinder, NP. We discussed all aspects of the encounter. I agree with the assessment and plan as stated above.   He remains very tenuous. Improving slowly with CVVHD but still markedly volume overloaded. Continue  CVVHD.  Confusion is likely multifactorial hopefully will improved with CVVHD. Watch closely for infection. Maintaining NSR.   Talena Neira,MD 11:42 AM

## 2014-02-03 NOTE — Progress Notes (Signed)
Patient ID: ALEKSEI GOODLIN, male    DOB: 05-11-1929, 80 y.o.   MRN: [id number]  S: Sleeping  O:BP 103/69  Pulse 109  Temp(Src) 98.1 F (36.7 C) (Oral)  Resp 13  Ht 5\' 11"  (1.803 m)  Wt 235 lb 14.3 oz (107 kg)  BMI 32.91 kg/m2  SpO2 95%  Intake/Output Summary (Last 24 hours) at 02/03/14 0753 Last data filed at 02/03/14 0700  Gross per 24 hour  Intake 2458.8 ml  Output   5484 ml  Net -3025.2 ml   Intake/Output: I/O last 3 completed shifts: In: 3529.3 [P.O.:200; I.V.:3329.3] Out: 8366 [Urine:755; QHUTM:5465; Chest Tube:760]  Intake/Output this shift:    Weight change: -11 lb 3.9 oz (-5.1 kg)  General: NAD  HEENT: Right IJ cath in place  CV: RRR. Well healed sternotomy scar.  Resp: clear anteriorly, normal effort  Abd: soft, obese, +BS  Ext: 2+ edema LE (improved) and 2+ edema UE.  Neuro: sleeping currently   Recent Labs Lab 01/30/14 1740 01/31/14 0500 02/01/14 0335  02/02/14 0423 02/02/14 0430  02/02/14 1300  02/02/14 2007 02/02/14 2100 02/02/14 2209 02/02/14 2211 02/03/14 0400 02/03/14 0510 02/03/14 0511  NA 134* 131* 131*  < > 129* 130*  < > 135*  131*  < > 132* 135* 135* 133* 139 136* 134*  K 4.1 3.9 3.5*  < > 3.0* 3.2*  < > 3.2*  2.9*  < > 3.2* 3.3* 3.1* 3.1* 3.2* 2.9* 3.2*  CL 98 95* 93*  --   --  88*  --  86*  < > 84* 85* 80* 82* 87* 79* 80*  CO2 19 19 20   --   --  24  --  31  --   --  34*  --   --  38*  --   --   GLUCOSE 114* 97 76  --   --  159*  --  167*  < > 172* 161* 229* 177* 174* 236* 182*  BUN 42* 46* 55*  --   --  51*  --  41*  < > 33* 36* 29* 33* 31* 25* 36*  CREATININE 3.59* 3.71* 4.04*  --   --  3.41*  --  2.69*  < > 2.60* 2.47* 2.00* 2.40* 2.23* 1.90* 2.20*  ALBUMIN  --   --  3.1*  --   --  3.0*  3.0*  --  2.9*  --   --  2.9*  --   --  2.9*  --   --   CALCIUM 8.2* 8.0* 8.0*  --   --  9.1  --  9.9  --   --  9.9  --   --  10.0  --   --   PHOS  --   --   --   --   --  4.5  --  3.2  --   --  2.9  --   --  2.6  --   --   AST  --   --  182*   --   --  107*  --   --   --   --   --   --   --   --   --   --   ALT  --   --  495*  --   --  368*  --   --   --   --   --   --   --   --   --   --   < > =  values in this interval not displayed. Liver Function Tests:  Recent Labs Lab 02/01/14 0335 02/02/14 0430 02/02/14 1300 02/02/14 2100 02/03/14 0400  AST 182* 107*  --   --   --   ALT 495* 368*  --   --   --   ALKPHOS 68 75  --   --   --   BILITOT 1.0 1.3*  --   --   --   PROT 5.7* 5.9*  --   --   --   ALBUMIN 3.1* 3.0*  3.0* 2.9* 2.9* 2.9*   No results found for this basename: LIPASE, AMYLASE,  in the last 168 hours No results found for this basename: AMMONIA,  in the last 168 hours CBC:  Recent Labs Lab 01/30/14 1740 01/31/14 0500 02/01/14 0335  02/02/14 0430  02/03/14 0400 02/03/14 0510 02/03/14 0511  WBC 17.5* 17.4* 17.4*  --  16.5*  --  17.3*  --   --   HGB 8.4* 8.1* 8.1*  < > 8.2*  < > 9.3* 10.9* 9.9*  HCT 24.5* 23.5* 23.2*  < > 23.8*  < > 29.4* 32.0* 29.0*  MCV 86.9 87.4 87.5  --  89.5  --  91.9  --   --   PLT 87* 97* 118*  --  178  --  233  --   --   < > = values in this interval not displayed. Cardiac Enzymes:  Recent Labs Lab 01/28/14 0320 01/28/14 1500 01/29/14 0320 01/29/14 1545  CKTOTAL 1183* 553* 363* 347*  CKMB 219.7* 96.6* 37.1* 20.1*  TROPONINI >20.00* >20.00* >20.00* >20.00*   CBG:  Recent Labs Lab 02/01/14 2200 02/02/14 0735 02/02/14 1213 02/02/14 1609 02/02/14 2159  GLUCAP 114* 162* 153* 172* 179*    Iron Studies: No results found for this basename: IRON, TIBC, TRANSFERRIN, FERRITIN,  in the last 72 hours Studies/Results: Dg Chest Port 1 View  02/02/2014   CLINICAL DATA:  Evaluate chest tube placement  EXAM: PORTABLE CHEST - 1 VIEW  COMPARISON:  02/02/2014  FINDINGS: Interval placement of right chest tube with significant decrease in volume of right pneumothorax. Right IJ catheter tip is in the SVC. There is a right subclavian catheter with tip in the cavoatrial junction. Left  chest wall pacer device is noted with leads in the right atrial appendage and right ventricle. Heart size appears mildly enlarged. There is mild diffuse edema and a small left pleural effusion.  IMPRESSION: 1. Interval placement of right chest tube with significant decrease in volume of right-sided pneumothorax.   Electronically Signed   By: Kerby Moors M.D.   On: 02/02/2014 09:53   Dg Chest Port 1 View  02/02/2014   CLINICAL DATA:  CHF, NYHA IV, acute on chronic systolic, personal history of hypertension, atrial fibrillation, post CABG and Maze procedure on 02/01/2014  EXAM: PORTABLE CHEST - 1 VIEW  COMPARISON:  Portable exam 0623 hr compared to 02/01/2014  FINDINGS: RIGHT jugular central venous catheter with tip projecting over SVC.  RIGHT subclavian central venous catheter tip projecting over cavoatrial junction.  Epicardial pacing wires present.  LEFT subclavian sequential pacemaker leads project over RIGHT atrium and RIGHT ventricle.  Enlargement of cardiac silhouette post median sternotomy and CABG.  Atherosclerotic calcification aorta.  Pulmonary vascularity normal.  Bibasilar atelectasis.  RIGHT pneumothorax identified estimated at 20%.  No mediastinal shift.  Remaining lungs clear.  No definite pleural effusion.  Chest wall emphysema at the lateral inferior hemithoraces bilaterally.  IMPRESSION: Approximately 20% RIGHT  pneumothorax without mediastinal shift.  Enlargement of cardiac silhouette post CABG and pacemaker.  Bibasilar atelectasis.  Critical Value/emergent results were called by telephone at the time of interpretation on 02/02/2014 at 0822 hr to patient's nurse [place] [address] who verbally acknowledged these results.   Electronically Signed   By: Lavonia Dana M.D.   On: 02/02/2014 08:23   Dg Chest Port 1v Same Day  02/01/2014   CLINICAL DATA:  80 year old male status post central line placement. Initial encounter.  EXAM: PORTABLE CHEST - 1 VIEW SAME DAY  COMPARISON:  0538 hr the  same day and earlier.  FINDINGS: Portable AP semi upright view at 1750 hrs. Right IJ central line revised, tip now projects just above the carina.  Subtle left 8 pneumothorax appears decreased. No right pleural edge identified. Mildly improved lung volumes. Stable cardiac size and mediastinal contours. Stable to decreased pulmonary vascular congestion. No definite pleural effusion or consolidation.  IMPRESSION: 1. Right IJ central line tip at the low SVC level. 2. Decreased small left apical pneumothorax. Right pneumothorax no longer visible. 3. No new cardiopulmonary abnormality.   Electronically Signed   By: Lars Pinks M.D.   On: 02/01/2014 18:18   . antiseptic oral rinse  7 mL Mouth Rinse BID  . aspirin EC  325 mg Oral Daily   Or  . aspirin  324 mg Per Tube Daily  . atorvastatin  40 mg Oral q1800  . bisacodyl  10 mg Oral Daily   Or  . bisacodyl  10 mg Rectal Daily  . docusate sodium  200 mg Oral Daily  . insulin aspart  0-9 Units Subcutaneous TID WC  . pantoprazole  40 mg Oral Daily  . sodium chloride  10-40 mL Intracatheter Q12H  . sodium chloride  3 mL Intravenous Q12H    BMET    Component Value Date/Time   NA 134* 02/03/2014 0511   K 3.2* 02/03/2014 0511   CL 80* 02/03/2014 0511   CO2 38* 02/03/2014 0400   GLUCOSE 182* 02/03/2014 0511   BUN 36* 02/03/2014 0511   CREATININE 2.20* 02/03/2014 0511   CALCIUM 10.0 02/03/2014 0400   GFRNONAA 26* 02/03/2014 0400   GFRAA 30* 02/03/2014 0400   CBC    Component Value Date/Time   WBC 17.3* 02/03/2014 0400   RBC 3.20* 02/03/2014 0400   HGB 9.9* 02/03/2014 0511   HCT 29.0* 02/03/2014 0511   PLT 233 02/03/2014 0400   MCV 91.9 02/03/2014 0400   MCH 29.1 02/03/2014 0400   MCHC 31.6 02/03/2014 0400   RDW 17.1* 02/03/2014 0400   LYMPHSABS 1.4 01/06/2014 1305   MONOABS 1.1* 01/06/2014 1305   EOSABS 0.1 01/06/2014 1305   BASOSABS 0.0 01/06/2014 1305     Assessment/Plan: 80 y.o. male with PMH CKD, CAD, [name], CHF, HTN; underwent 3v CABG  and MAZE on 61/95 complicated by postop MI requiring pressor and inotrope support. Inadequate diuresis and fluid overload, now receiving CVVHD.   1. Acute on CKD and fluid overload: Cardiorenal. Scr stable 2.23. CVVHD for fluid removal, UF at 220cc/hr now. 2. Vascular access: right IJ.  3. Anemia: stable.  4. Hyponatremia: correct with CVVHD  5. Hypokalemia: again low, will replete. 6. Acute on chronic CHF: on milrinone, dopamine, levophed.  7. CAD s/p 3v CABG on 09/32: complicated by periop MI, drips per above.  8. Afib: pacemaker, MAZE procedure 10/15  9. Transaminitis: hepatic congestion, cardiogenic shock. 10. Pneumothorax: s/p chest tube this AM  Tawanna Sat [facility] C

## 2014-02-04 ENCOUNTER — Inpatient Hospital Stay (HOSPITAL_COMMUNITY): Payer: Medicare HMO

## 2014-02-04 DIAGNOSIS — Z951 Presence of aortocoronary bypass graft: Secondary | ICD-10-CM

## 2014-02-04 LAB — RENAL FUNCTION PANEL
Albumin: 3 g/dL — ABNORMAL LOW (ref 3.5–5.2)
Albumin: 3.1 g/dL — ABNORMAL LOW (ref 3.5–5.2)
Albumin: 3.1 g/dL — ABNORMAL LOW (ref 3.5–5.2)
Anion gap: 11 (ref 5–15)
Anion gap: 11 (ref 5–15)
Anion gap: 10 (ref 5–15)
BUN: 23 mg/dL (ref 6–23)
BUN: 22 mg/dL (ref 6–23)
BUN: 23 mg/dL (ref 6–23)
CALCIUM: 9 mg/dL (ref 8.4–10.5)
CALCIUM: 8.7 mg/dL (ref 8.4–10.5)
CHLORIDE: 92 meq/L — AB (ref 96–112)
CO2: 38 meq/L — AB (ref 19–32)
CO2: 34 mEq/L — ABNORMAL HIGH (ref 19–32)
CO2: 33 meq/L — AB (ref 19–32)
CREATININE: 1.69 mg/dL — AB (ref 0.50–1.35)
Calcium: 9.4 mg/dL (ref 8.4–10.5)
Chloride: 91 mEq/L — ABNORMAL LOW (ref 96–112)
Chloride: 97 mEq/L (ref 96–112)
Creatinine, Ser: 1.81 mg/dL — ABNORMAL HIGH (ref 0.50–1.35)
Creatinine, Ser: 1.68 mg/dL — ABNORMAL HIGH (ref 0.50–1.35)
GFR calc Af Amer: 39 mL/min — ABNORMAL LOW (ref >90)
GFR calc Af Amer: 42 mL/min — ABNORMAL LOW (ref >90)
GFR calc Af Amer: 42 mL/min — ABNORMAL LOW (ref >90)
GFR calc non Af Amer: 33 mL/min — ABNORMAL LOW (ref >90)
GFR calc non Af Amer: 37 mL/min — ABNORMAL LOW (ref >90)
GFR, EST NON AFRICAN AMERICAN: 36 mL/min — AB (ref >90)
GLUCOSE: 130 mg/dL — AB (ref 70–99)
GLUCOSE: 145 mg/dL — AB (ref 70–99)
GLUCOSE: 87 mg/dL (ref 70–99)
PHOSPHORUS: 2.8 mg/dL (ref 2.3–4.6)
POTASSIUM: 4.1 meq/L (ref 3.7–5.3)
Phosphorus: 2.9 mg/dL (ref 2.3–4.6)
Phosphorus: 2.9 mg/dL (ref 2.3–4.6)
Potassium: 4.1 mEq/L (ref 3.7–5.3)
Potassium: 4.3 mEq/L (ref 3.7–5.3)
Sodium: 140 mEq/L (ref 137–147)
Sodium: 137 mEq/L (ref 137–147)
Sodium: 140 mEq/L (ref 137–147)

## 2014-02-04 LAB — POCT I-STAT 3, ART BLOOD GAS (G3+)
Acid-Base Excess: 19 mmol/L — ABNORMAL HIGH (ref 0.0–2.0)
Acid-Base Excess: 16 mmol/L — ABNORMAL HIGH (ref 0.0–2.0)
Bicarbonate: 44.2 mEq/L — ABNORMAL HIGH (ref 20.0–24.0)
Bicarbonate: 39.7 mEq/L — ABNORMAL HIGH (ref 20.0–24.0)
O2 Saturation: 95 %
O2 Saturation: 92 %
PH ART: 7.558 — AB (ref 7.350–7.450)
PH ART: 7.564 — AB (ref 7.350–7.450)
PO2 ART: 66 mmHg — AB (ref 80.0–100.0)
TCO2: 46 mmol/L (ref 0–100)
TCO2: 41 mmol/L (ref 0–100)
pCO2 arterial: 49.4 mmHg — ABNORMAL HIGH (ref 35.0–45.0)
pCO2 arterial: 43.8 mmHg (ref 35.0–45.0)
pO2, Arterial: 55 mmHg — ABNORMAL LOW (ref 80.0–100.0)

## 2014-02-04 LAB — URINE MICROSCOPIC-ADD ON

## 2014-02-04 LAB — GLUCOSE, CAPILLARY
GLUCOSE-CAPILLARY: 107 mg/dL — AB (ref 70–99)
GLUCOSE-CAPILLARY: 72 mg/dL (ref 70–99)
Glucose-Capillary: 139 mg/dL — ABNORMAL HIGH (ref 70–99)

## 2014-02-04 LAB — CARBOXYHEMOGLOBIN
Carboxyhemoglobin: 2.1 % — ABNORMAL HIGH (ref 0.5–1.5)
METHEMOGLOBIN: 1 % (ref 0.0–1.5)
O2 Saturation: 55.9 %
Total hemoglobin: 8.5 g/dL — ABNORMAL LOW (ref 13.5–18.0)

## 2014-02-04 LAB — CBC
HEMATOCRIT: 27.4 % — AB (ref 39.0–52.0)
HEMOGLOBIN: 8.6 g/dL — AB (ref 13.0–17.0)
MCH: 29.9 pg (ref 26.0–34.0)
MCHC: 31.4 g/dL (ref 30.0–36.0)
MCV: 95.1 fL (ref 78.0–100.0)
Platelets: 275 10*3/uL (ref 150–400)
RBC: 2.88 MIL/uL — AB (ref 4.22–5.81)
RDW: 18.5 % — ABNORMAL HIGH (ref 11.5–15.5)
WBC: 17.5 10*3/uL — ABNORMAL HIGH (ref 4.0–10.5)

## 2014-02-04 LAB — URINALYSIS, ROUTINE W REFLEX MICROSCOPIC
Glucose, UA: NEGATIVE mg/dL
Ketones, ur: 15 mg/dL — AB
NITRITE: NEGATIVE
PH: 5 (ref 5.0–8.0)
Protein, ur: 30 mg/dL — AB
SPECIFIC GRAVITY, URINE: 1.026 (ref 1.005–1.030)
Urobilinogen, UA: 1 mg/dL (ref 0.0–1.0)

## 2014-02-04 LAB — HEPARIN LEVEL (UNFRACTIONATED): Heparin Unfractionated: <0.1 IU/mL — ABNORMAL LOW (ref 0.30–0.70)

## 2014-02-04 LAB — MAGNESIUM: MAGNESIUM: 2.1 mg/dL (ref 1.5–2.5)

## 2014-02-04 MED ORDER — NYSTATIN 100000 UNIT/ML MT SUSP
5.0000 mL | Freq: Four times a day (QID) | OROMUCOSAL | Status: DC
  Administered 2014-02-04 – 2014-02-05 (×3): 500000 [IU] via ORAL
  Filled 2014-02-04 (×12): qty 5

## 2014-02-04 MED ORDER — DOPAMINE-DEXTROSE 3.2-5 MG/ML-% IV SOLN
3.0000 ug/kg/min | INTRAVENOUS | Status: DC
  Administered 2014-02-05: 3 ug/kg/min via INTRAVENOUS
  Filled 2014-02-04: qty 250

## 2014-02-04 MED ORDER — PIPERACILLIN-TAZOBACTAM 3.375 G IVPB 30 MIN
3.3750 g | Freq: Four times a day (QID) | INTRAVENOUS | Status: DC
  Administered 2014-02-04 – 2014-02-06 (×9): 3.375 g via INTRAVENOUS
  Filled 2014-02-04 (×12): qty 50

## 2014-02-04 MED ORDER — VITAL HIGH PROTEIN PO LIQD: 1000.0000 mL | ORAL | Status: DC

## 2014-02-04 MED ORDER — OSMOLITE 1.5 CAL PO LIQD
1000.0000 mL | ORAL | Status: DC
  Administered 2014-02-04 – 2014-02-06 (×2): 1000 mL
  Filled 2014-02-04 (×5): qty 1000

## 2014-02-04 MED ORDER — PRO-STAT SUGAR FREE PO LIQD
30.0000 mL | Freq: Three times a day (TID) | ORAL | Status: DC
  Administered 2014-02-04 – 2014-02-06 (×6): 30 mL
  Filled 2014-02-04 (×8): qty 30

## 2014-02-04 MED ORDER — FLUCONAZOLE IN SODIUM CHLORIDE 400-0.9 MG/200ML-% IV SOLN
800.0000 mg | INTRAVENOUS | Status: DC
  Administered 2014-02-04 – 2014-02-06 (×3): 800 mg via INTRAVENOUS
  Filled 2014-02-04 (×4): qty 400

## 2014-02-04 MED ORDER — HEPARIN (PORCINE) IN NACL 100-0.45 UNIT/ML-% IJ SOLN
1200.0000 [IU]/h | INTRAMUSCULAR | Status: DC
  Administered 2014-02-04: 750 [IU]/h via INTRAVENOUS
  Administered 2014-02-05: 1050 [IU]/h via INTRAVENOUS
  Administered 2014-02-06: 1200 [IU]/h via INTRAVENOUS
  Filled 2014-02-04 (×6): qty 250

## 2014-02-04 MED ORDER — VANCOMYCIN HCL IN DEXTROSE 1-5 GM/200ML-% IV SOLN
1000.0000 mg | INTRAVENOUS | Status: DC
  Administered 2014-02-04 – 2014-02-06 (×3): 1000 mg via INTRAVENOUS
  Filled 2014-02-04 (×3): qty 200

## 2014-02-04 NOTE — Progress Notes (Signed)
SLP Cancellation Note  Patient Details Name: Francisco Merritt MRN: [id number] DOB: [birth year]   Cancelled treatment:        Orders received for BSE. Per RN, pt having difficulty with solid foods. Now pt is lethargic, NPO, with TF in place.  Pt has continuous HD. Will continue efforts as pt alertness allows.  Celia B. Quentin Ore [facility], CCC-SLP [phone]  Shonna Chock 02/04/2014, 3:02 PM

## 2014-02-04 NOTE — Progress Notes (Addendum)
[address]       [place] [address]             [phone]      8 Days Post-Op Procedure(s) (LRB): CORONARY ARTERY BYPASS GRAFTING (CABG), ON PUMP, TIMES THREE, USING LEFT INTERNAL MAMMARY ARTERY, RIGHT GREATER SAPHENOUS VEIN HARVESTED ENDOSCOPICALLY. (N/A) MAZE (N/A) INTRAOPERATIVE TRANSESOPHAGEAL ECHOCARDIOGRAM (N/A)  Subjective:  Francisco Merritt states he does not feel that well today.  He is experiencing chills.  His toes and finger tips are slightly cyanotic, little to no PO intake  Objective: Vital signs in last 24 hours: Temp:  [97.4 F (36.3 C)-97.6 F (36.4 C)] 97.5 F (36.4 C) (10/23 0353) Pulse Rate:  [61-121] 104 (10/23 0700) Cardiac Rhythm:  [-] Sinus tachycardia;A-V Sequential paced;Atrial fibrillation (10/22 2000) Resp:  [13-26] 24 (10/23 0700) BP: (79-115)/(42-80) 102/47 mmHg (10/23 0700) SpO2:  [90 %-100 %] 100 % (10/23 0700) Arterial Line BP: (98-127)/(40-75) 110/56 mmHg (10/23 0700) Weight:  [232 lb 5.8 oz (105.4 kg)] 232 lb 5.8 oz (105.4 kg) (10/23 0500)  Hemodynamic parameters for last 24 hours: CVP:  [11 mmHg-19 mmHg] 16 mmHg  Intake/Output from previous day: 10/22 0701 - 10/23 0700 In: 2008.4 [P.O.:90; I.V.:1618.4; IV Piggyback:300] Out: 4767 [Urine:255; Chest Tube:20]  General appearance: cooperative and no distress Heart: irregularly irregular rhythm Lungs: clear to auscultation bilaterally Abdomen: soft, non-tender; bowel sounds normal; no masses,  no organomegaly Extremities: edema 2-3+ Wound: clean and dry, some serous drainage on dressing  Lab Results:  Recent Labs  02/03/14 0400  02/03/14 2223 02/04/14 0429  WBC 17.3*  --   --  17.5*  HGB 9.3*  < > 11.2* 8.6*  HCT 29.4*  < > 33.0* 27.4*  PLT 233  --   --  275  < > = values in this interval not displayed. BMET:  Recent Labs  02/03/14 2100  02/03/14 2223 02/04/14 0429  NA 139  < > 138 140  K 3.8  < > 3.5* 4.1  CL 87*  < > 80* 91*  CO2 43*  --   --   38*  GLUCOSE 134*  < > 164* 130*  BUN 24*  < > 20 23  CREATININE 1.83*  < > 1.60* 1.81*  CALCIUM 9.9  --   --  9.4  < > = values in this interval not displayed.  PT/INR: No results found for this basename: LABPROT, INR,  in the last 72 hours ABG    Component Value Date/Time   PHART 7.558* 02/04/2014 0419   HCO3 44.2* 02/04/2014 0419   TCO2 46 02/04/2014 0419   ACIDBASEDEF 1.0 02/02/2014 0013   O2SAT 55.9 02/04/2014 0428   CBG (last 3)   Recent Labs  02/03/14 0729 02/03/14 1214 02/03/14 1543  GLUCAP 156* 152* 130*    Assessment/Plan: S/P Procedure(s) (LRB): CORONARY ARTERY BYPASS GRAFTING (CABG), ON PUMP, TIMES THREE, USING LEFT INTERNAL MAMMARY ARTERY, RIGHT GREATER SAPHENOUS VEIN HARVESTED ENDOSCOPICALLY. (N/A) MAZE (N/A) INTRAOPERATIVE TRANSESOPHAGEAL ECHOCARDIOGRAM (N/A)  1. CV- Atrial Fibrillation overnight, received Amiodarone bolus, remains on Milrinone, Dopamine, Amiodarone  2. Pulm- wean oxygen as tolerated, no wheezing appreciated this morning, continue IS 3. Renal- CKD Stg III, on CVVHD again this morning, Nephrology following, weight improving but remains hypervolemic 4. ? Thrush- per nurse winces and very uncomfortable during mouth care- will add Nystatin  5. Malnutrition?- little to no PO intake, will monitor if continues to not eat may benefit from additional support 5. CBGs-  remain controlled 6. Dispo- patient not feeling well today, A. Fib overnight, continue drips for pressure support   LOS: 16 days    Francisco Merritt, Francisco Merritt 02/04/2014  Patient seen and examined Cultured for possible early sepsis- his WBC is elevated, agree with plan to start broad spectrum antibiotics His co-ox is better today and he is off levophed, dopamine down to 2- will continue to wean as BP tolerates Continue milrinone His PO intake is essentially nothing- will place Panda and start enteral feeds

## 2014-02-04 NOTE — Progress Notes (Signed)
Patient ID: Francisco Merritt, male    DOB: 1929/08/22, 80 y.o.   MRN: [id number] Nephrology Progress Note  Assessment/Plan: 80 y.o. male with PMH CKD, CAD, [name], CHF, HTN; underwent 3v CABG and MAZE on 37/10 complicated by postop MI requiring pressor and inotrope support. Inadequate diuresis and fluid overload, now receiving CVVHD.  1. Acute on CKD and fluid overload: Postop MI and cardiogenic shock   Plan: Cont CRRT, fluid removal as tolerated, net goal 100-150cc/hr. Still some ways to go.  2. Vascular access: right IJ.  3. Hypokalemia: resolved currently. 4. Metabolic alkalosis: citrate d/c'd, may be hypoventilated as well for compensation, will need to monitor respiratory status closely 5. Acute on chronic CHF: on milrinone, dopamine.  6. CAD s/p 3v CABG on 62/69: complicated by periop MI, drips per above.  7. Afib: pacemaker, MAZE procedure 10/15  8. Transaminitis: hepatic congestion, cardiogenic shock. 9. Pneumothorax: s/p chest tube 10/22  Renal Attending; Tolerating CRRT for nor.  Will continue to UF.  He developed alkalosis so we stopped the sodium citrate anticoagulant. Francisco Merritt C  _________________________________________________________________ S: sleeping, awakes only briefly  O:BP 102/47  Pulse 104  Temp(Src) 97.5 F (36.4 C) (Oral)  Resp 24  Ht 5\' 11"  (1.803 m)  Wt 232 lb 5.8 oz (105.4 kg)  BMI 32.42 kg/m2  SpO2 100%  Intake/Output Summary (Last 24 hours) at 02/04/14 0821 Last data filed at 02/04/14 0800  Gross per 24 hour  Intake 1883.71 ml  Output   4712 ml  Net -2828.29 ml   Intake/Output: I/O last 3 completed shifts: In: 3120 [P.O.:90; I.V.:2730; IV Piggyback:300] Out: 7193 [Urine:375; SWNIO:2703; Chest Tube:130]  Intake/Output this shift:  Total I/O In: -  Out: 154 [Other:154] Weight change: -3 lb 8.4 oz (-1.6 kg) General: NAD  HEENT: Right IJ cath in place  CV: RRR. Well healed sternotomy scar.  Resp: clear anteriorly, normal effort  Abd:  soft, obese, +BS  Ext: 2+ edema LE (improved) and 2+ edema UE.  Neuro: sleeping, awakes only briefly.   Recent Labs Lab 02/01/14 0335  02/02/14 0430  02/02/14 1300  02/02/14 2100  02/03/14 0400  02/03/14 1300  02/03/14 1818 02/03/14 2007 02/03/14 2009 02/03/14 2100 02/03/14 2201 02/03/14 2223 02/04/14 0429  NA 131*  < > 130*  < > 135*  131*  < > 135*  < > 139  < > 139  < > 137 138 137 139 137 138 140  K 3.5*  < > 3.2*  < > 3.2*  2.9*  < > 3.3*  < > 3.2*  < > 3.6*  < > 3.5* 3.6* 3.6* 3.8 3.4* 3.5* 4.1  CL 93*  --  88*  --  86*  < > 85*  < > 87*  < > 85*  < > 81* 81* 81* 87* 83* 80* 91*  CO2 20  --  24  --  31  --  34*  --  38*  --  42*  --   --   --   --  43*  --   --  38*  GLUCOSE 76  --  159*  --  167*  < > 161*  < > 174*  < > 182*  < > 137* 153* 133* 134* 139* 164* 130*  BUN 55*  --  51*  --  41*  < > 36*  < > 31*  < > 26*  < > 23 21 24* 24* 23 20 23   CREATININE 4.04*  --  3.41*  --  2.69*  < > 2.47*  < > 2.23*  < > 1.96*  < > 1.90* 1.60* 1.90* 1.83* 1.80* 1.60* 1.81*  ALBUMIN 3.1*  --  3.0*  3.0*  --  2.9*  --  2.9*  --  2.9*  --  3.0*  --   --   --   --  3.0*  --   --  3.0*  CALCIUM 8.0*  --  9.1  --  9.9  --  9.9  --  10.0  --  10.2  --   --   --   --  9.9  --   --  9.4  PHOS  --   --  4.5  --  3.2  --  2.9  --  2.6  --  2.3  --   --   --   --  2.6  --   --  2.8  AST 182*  --  107*  --   --   --   --   --   --   --   --   --   --   --   --   --   --   --   --   ALT 495*  --  368*  --   --   --   --   --   --   --   --   --   --   --   --   --   --   --   --   < > = values in this interval not displayed. Liver Function Tests:  Recent Labs Lab 02/01/14 0335 02/02/14 0430  02/03/14 1300 02/03/14 2100 02/04/14 0429  AST 182* 107*  --   --   --   --   ALT 495* 368*  --   --   --   --   ALKPHOS 68 75  --   --   --   --   BILITOT 1.0 1.3*  --   --   --   --   PROT 5.7* 5.9*  --   --   --   --   ALBUMIN 3.1* 3.0*  3.0*  < > 3.0* 3.0* 3.0*  < > = values in this  interval not displayed. No results found for this basename: LIPASE, AMYLASE,  in the last 168 hours No results found for this basename: AMMONIA,  in the last 168 hours CBC:  Recent Labs Lab 01/31/14 0500 02/01/14 0335  02/02/14 0430  02/03/14 0400  02/03/14 2201 02/03/14 2223 02/04/14 0429  WBC 17.4* 17.4*  --  16.5*  --  17.3*  --   --   --  17.5*  HGB 8.1* 8.1*  < > 8.2*  < > 9.3*  < > 10.2* 11.2* 8.6*  HCT 23.5* 23.2*  < > 23.8*  < > 29.4*  < > 30.0* 33.0* 27.4*  MCV 87.4 87.5  --  89.5  --  91.9  --   --   --  95.1  PLT 97* 118*  --  178  --  233  --   --   --  275  < > = values in this interval not displayed. Cardiac Enzymes:  Recent Labs Lab 01/28/14 1500 01/29/14 0320 01/29/14 1545  CKTOTAL 553* 363* 347*  CKMB 96.6* 37.1* 20.1*  TROPONINI >20.00* >20.00* >20.00*   CBG:  Recent Labs Lab 02/02/14 1609 02/02/14 2159  02/03/14 0729 02/03/14 1214 02/03/14 1543  GLUCAP 172* 179* 156* 152* 130*    Iron Studies: No results found for this basename: IRON, TIBC, TRANSFERRIN, FERRITIN,  in the last 72 hours Studies/Results: Dg Chest Port 1 View  02/04/2014   CLINICAL DATA:  Status post CABG 8 days ago  EXAM: PORTABLE CHEST - 1 VIEW  COMPARISON:  Portable chest x-ray of February 02, 2014  FINDINGS: The lungs are reasonably well inflated. No pneumothorax is evident. The right basilar chest tube is unchanged in position. There is no significant pleural effusion. The pulmonary interstitial markings are slightly more prominent today bilaterally. The cardiopericardial silhouette remains enlarged and the pulmonary vascularity is engorged. The right internal jugular venous catheter tip overlies the proximal SVC. The right subclavian venous catheter tip projects over the right cavoatrial junction. The permanent pacemaker is unchanged in appearance.  IMPRESSION: 1. There is no pneumothorax or significant pleural effusion. The right-sided chest tube is unchanged projecting over the  right hemidiaphragm. 2. Increased interstitial density bilaterally is consistent with slight worsening of interstitial edema.   Electronically Signed   By: David  Martinique   On: 02/04/2014 08:03   Dg Chest Port 1 View  02/02/2014   CLINICAL DATA:  Evaluate chest tube placement  EXAM: PORTABLE CHEST - 1 VIEW  COMPARISON:  02/02/2014  FINDINGS: Interval placement of right chest tube with significant decrease in volume of right pneumothorax. Right IJ catheter tip is in the SVC. There is a right subclavian catheter with tip in the cavoatrial junction. Left chest wall pacer device is noted with leads in the right atrial appendage and right ventricle. Heart size appears mildly enlarged. There is mild diffuse edema and a small left pleural effusion.  IMPRESSION: 1. Interval placement of right chest tube with significant decrease in volume of right-sided pneumothorax.   Electronically Signed   By: Kerby Moors M.D.   On: 02/02/2014 09:53   . antiseptic oral rinse  7 mL Mouth Rinse BID  . aspirin EC  325 mg Oral Daily   Or  . aspirin  324 mg Per Tube Daily  . atorvastatin  40 mg Oral q1800  . bisacodyl  10 mg Oral Daily   Or  . bisacodyl  10 mg Rectal Daily  . docusate sodium  200 mg Oral Daily  . insulin aspart  0-9 Units Subcutaneous TID WC  . metoCLOPramide  5 mg Oral TID AC  . pantoprazole  40 mg Oral Daily  . sodium chloride  10-40 mL Intracatheter Q12H    BMET    Component Value Date/Time   NA 140 02/04/2014 0429   K 4.1 02/04/2014 0429   CL 91* 02/04/2014 0429   CO2 38* 02/04/2014 0429   GLUCOSE 130* 02/04/2014 0429   BUN 23 02/04/2014 0429   CREATININE 1.81* 02/04/2014 0429   CALCIUM 9.4 02/04/2014 0429   GFRNONAA 33* 02/04/2014 0429   GFRAA 39* 02/04/2014 0429   CBC    Component Value Date/Time   WBC 17.5* 02/04/2014 0429   RBC 2.88* 02/04/2014 0429   HGB 8.6* 02/04/2014 0429   HCT 27.4* 02/04/2014 0429   PLT 275 02/04/2014 0429   MCV 95.1 02/04/2014 0429   MCH 29.9  02/04/2014 0429   MCHC 31.4 02/04/2014 0429   RDW 18.5* 02/04/2014 0429   LYMPHSABS 1.4 01/06/2014 1305   MONOABS 1.1* 01/06/2014 1305   EOSABS 0.1 01/06/2014 1305   BASOSABS 0.0 01/06/2014 1305    Tawanna Sat 8:21 AM

## 2014-02-04 NOTE — Progress Notes (Signed)
Attempt made by two separate RT's to place radial aline. Both RT's attempted x2, but was unable to thread catheter. RN notified.

## 2014-02-04 NOTE — Progress Notes (Signed)
Spoke to Dr. Prescott Gum to advise of pt irregular heart.  Advised him of conversation with Dr. Mercy Moore and orders received  From Dr. Mercy Moore.  Advised of EKG, lab and ABG results.  Received orders for 150mg  Amio bolus.  Order also received to titrate Dopamine to 2 mcg/kg/min for hypertension.  Order received for ABG in morning.  Will initiate orders and continue to monitor.

## 2014-02-04 NOTE — Plan of Care (Signed)
Problem: Phase II - Intermediate Post-Op Goal: Maintain Hemodynamic Stability Outcome: Progressing Dopamine and amio continues Goal: Activity Progressed Outcome: Not Progressing Pt remains on CRRT

## 2014-02-04 NOTE — Progress Notes (Signed)
Physical Therapy Treatment Patient Details Name: Francisco Merritt MRN: [id number] DOB: 11/01/1929 Today's Date: 02-25-14    History of Present Illness Francisco Merritt is an 80 year old with a history of obesity, hypertension, Afib, tachybrady syndrome, s/p pacemaker and currently s/p CABG with complication of periop MI and acute renal failure.    PT Comments    Pt admitted with above. Pt currently with functional limitations due to balance and endurance deficits as well as strength deficits.  Pt limited currently by CRRT and lethargy.  Pt will benefit from skilled PT to increase their independence and safety with mobility to allow discharge to the venue listed below.   Follow Up Recommendations  SNF;Supervision/Assistance - 24 hour     Equipment Recommendations  None recommended by PT    Recommendations for Other Services       Precautions / Restrictions Precautions Precautions: Fall;Sternal Precaution Comments: CRRT in right neck Restrictions Weight Bearing Restrictions: Yes (Sternal precautions)    Mobility  Bed Mobility Overal bed mobility: Needs Assistance;+2 for physical assistance             General bed mobility comments: Moved pt up in bed for positioning with total assist.  Propped with pillows.    Transfers                    Ambulation/Gait                 Stairs            Wheelchair Mobility    Modified Rankin (Stroke Patients Only)       Balance                                    Cognition Arousal/Alertness: Lethargic;Suspect due to medications                          Exercises General Exercises - Upper Extremity Shoulder Flexion: AAROM;Right;10 reps;Supine Shoulder ABduction: AAROM;Right;10 reps;Supine Shoulder Horizontal ABduction: AAROM;Both;10 reps;Supine Elbow Flexion: AAROM;Right;10 reps;Supine General Exercises - Lower Extremity Ankle Circles/Pumps: AAROM;Both;10 reps;Supine Quad  Sets: AROM;Both;10 reps;Supine Heel Slides: AAROM;Both;10 reps;Supine Hip ABduction/ADduction: AAROM;Both;10 reps;Supine Straight Leg Raises: AAROM;Both;10 reps;Supine    General Comments        Pertinent Vitals/Pain Pain Assessment: No/denies pain VSS    Home Living                      Prior Function            PT Goals (current goals can now be found in the care plan section) Progress towards PT goals: Not progressing toward goals - comment (more lethargic today)    Frequency  Min 3X/week    PT Plan Current plan remains appropriate    Co-evaluation             End of Session Equipment Utilized During Treatment: Gait belt;Oxygen Activity Tolerance: Patient limited by fatigue;Patient limited by lethargy Patient left: in bed;with call bell/phone within reach;with nursing/sitter in room;with family/visitor present;with restraints reapplied     Time: 1244-1300 PT Time Calculation (min): 16 min  Charges:  $Therapeutic Exercise: 8-22 mins                    G Codes:      Francisco Merritt 02/25/2014, 2:41 PM Francisco Merritt,PT Acute Rehabilitation  [phone] (pager)

## 2014-02-04 NOTE — Progress Notes (Signed)
CRITICAL VALUE ALERT  Critical value received:  CO2 43 and Post Filter iCa 0.73  Date of notification:  02/03/14  Time of notification:  2225  Critical value read back:Yes.    Nurse who received alert:  Vivia Ewing, Rn   Responding MD:  Dr. Mercy Moore  Time MD responded:  [id number]

## 2014-02-04 NOTE — Progress Notes (Signed)
Advanced Heart Failure Rounding Note   Subjective:     Francisco Merritt is a 80 y.o. male with a history of obesity, hypertension, CAD, paroxysmal atrial fibrillation, systolic HF EF 49-70%, tachybradycardia syndrome and is status post pacemaker  Underwent R/L cath 10/7.  Severe 2V CAD.  Underwent CABG and Maze on 10/15. (left internal mammary artery to left anterior  descending, saphenous vein graft to first diagonal and OM1);   Suffered perioperative MI with trop > 20. Echo with poor images. LVEF "mild to moderately" depressed.  Developed renal failure with marked volume overload (> 40 pounds)   Continues on CVVHD at 150/hr. Tolerating fluid removal well. Weight down another 3 pounds.  Remains in NSR on amio. Remains on dopamine 41mcg and milrinone 0.25 mcg.    Denies SOB. Lethargic but follows commands. + sore throat. + chills. No fevers. . WBC up to 17.5K . Co-ox 56%. In/oit of NSR/AF. Has received amio 150 x 2.     Objective:   Weight Range:  Vital Signs:   Temp:  [97.4 F (36.3 C)-97.7 F (36.5 C)] 97.7 F (36.5 C) (10/23 0800) Pulse Rate:  [61-121] 94 (10/23 0800) Resp:  [13-28] 28 (10/23 0800) BP: (79-115)/(42-80) 95/64 mmHg (10/23 0800) SpO2:  [90 %-100 %] 100 % (10/23 0800) Arterial Line BP: (98-127)/(40-75) 110/51 mmHg (10/23 0800) Weight:  [232 lb 5.8 oz (105.4 kg)] 232 lb 5.8 oz (105.4 kg) (10/23 0500) Last BM Date: 02/01/14  Weight change: Filed Weights   02/02/14 0615 02/03/14 0500 02/04/14 0500  Weight: 247 lb 2.2 oz (112.1 kg) 235 lb 14.3 oz (107 kg) 232 lb 5.8 oz (105.4 kg)    Intake/Output:   Intake/Output Summary (Last 24 hours) at 02/04/14 0903 Last data filed at 02/04/14 0800  Gross per 24 hour  Intake 1837.81 ml  Output   4508 ml  Net -2670.19 ml     Physical Exam: General:  Elderly. In bed. Ill appearing. + chills HEENT: normal + thrush on tongue Neck: supple.RIJ trialysis catheter. . Carotids 2+ bilat; no bruits. No lymphadenopathy or  thryomegaly appreciated. Cor: PMI nondisplaced. RRR.+ sternal dressing.  Lungs: decreased BS anteriorly R chest tube in place Abdomen: soft, nontender, +distended. No hepatosplenomegaly. No bruits or masses. Good bowel sounds. Extremities: no cyanosis, clubbing, rash, 3+ edema Neuro: awake but lethargic. Follows basic commands. cranial nerves grossly intact. moves all 4 extremities w/o difficulty.   Telemetry:  SR with occasional v-pacing  Labs: Basic Metabolic Panel:  Recent Labs Lab 01/28/14 1500  02/02/14 0430  02/02/14 2100  02/03/14 0400  02/03/14 1300  02/03/14 2009 02/03/14 2100 02/03/14 2201 02/03/14 2223 02/04/14 0429  NA  --   < > 130*  < > 135*  < > 139  < > 139  < > 137 139 137 138 140  K  --   < > 3.2*  < > 3.3*  < > 3.2*  < > 3.6*  < > 3.6* 3.8 3.4* 3.5* 4.1  CL  --   < > 88*  < > 85*  < > 87*  < > 85*  < > 81* 87* 83* 80* 91*  CO2  --   < > 24  < > 34*  --  38*  --  42*  --   --  43*  --   --  38*  GLUCOSE  --   < > 159*  < > 161*  < > 174*  < > 182*  < >  133* 134* 139* 164* 130*  BUN  --   < > 51*  < > 36*  < > 31*  < > 26*  < > 24* 24* 23 20 23   CREATININE 1.70*  < > 3.41*  < > 2.47*  < > 2.23*  < > 1.96*  < > 1.90* 1.83* 1.80* 1.60* 1.81*  CALCIUM  --   < > 9.1  < > 9.9  --  10.0  --  10.2  --   --  9.9  --   --  9.4  MG 2.7*  --  2.4  --   --   --  2.0  --   --   --   --   --   --   --  2.1  PHOS  --   --  4.5  < > 2.9  --  2.6  --  2.3  --   --  2.6  --   --  2.8  < > = values in this interval not displayed.  Liver Function Tests:  Recent Labs Lab 02/01/14 0335 02/02/14 0430  02/02/14 2100 02/03/14 0400 02/03/14 1300 02/03/14 2100 02/04/14 0429  AST 182* 107*  --   --   --   --   --   --   ALT 495* 368*  --   --   --   --   --   --   ALKPHOS 68 75  --   --   --   --   --   --   BILITOT 1.0 1.3*  --   --   --   --   --   --   PROT 5.7* 5.9*  --   --   --   --   --   --   ALBUMIN 3.1* 3.0*  3.0*  < > 2.9* 2.9* 3.0* 3.0* 3.0*  < > = values in  this interval not displayed. No results found for this basename: LIPASE, AMYLASE,  in the last 168 hours No results found for this basename: AMMONIA,  in the last 168 hours  CBC:  Recent Labs Lab 01/31/14 0500 02/01/14 0335  02/02/14 0430  02/03/14 0400  02/03/14 2007 02/03/14 2009 02/03/14 2201 02/03/14 2223 02/04/14 0429  WBC 17.4* 17.4*  --  16.5*  --  17.3*  --   --   --   --   --  17.5*  HGB 8.1* 8.1*  < > 8.2*  < > 9.3*  < > 11.2* 10.5* 10.2* 11.2* 8.6*  HCT 23.5* 23.2*  < > 23.8*  < > 29.4*  < > 33.0* 31.0* 30.0* 33.0* 27.4*  MCV 87.4 87.5  --  89.5  --  91.9  --   --   --   --   --  95.1  PLT 97* 118*  --  178  --  233  --   --   --   --   --  275  < > = values in this interval not displayed.  Cardiac Enzymes:  Recent Labs Lab 01/28/14 1500 01/29/14 0320 01/29/14 1545  CKTOTAL 553* 363* 347*  CKMB 96.6* 37.1* 20.1*  TROPONINI >20.00* >20.00* >20.00*    BNP: BNP (last 3 results)  Recent Labs  01/06/14 1305 02/03/2014 0540  PROBNP 2171.0* 1919.0*     Other results:    Imaging: Dg Chest Port 1 View  02/04/2014   CLINICAL DATA:  Status post CABG 8  days ago  EXAM: PORTABLE CHEST - 1 VIEW  COMPARISON:  Portable chest x-ray of February 02, 2014  FINDINGS: The lungs are reasonably well inflated. No pneumothorax is evident. The right basilar chest tube is unchanged in position. There is no significant pleural effusion. The pulmonary interstitial markings are slightly more prominent today bilaterally. The cardiopericardial silhouette remains enlarged and the pulmonary vascularity is engorged. The right internal jugular venous catheter tip overlies the proximal SVC. The right subclavian venous catheter tip projects over the right cavoatrial junction. The permanent pacemaker is unchanged in appearance.  IMPRESSION: 1. There is no pneumothorax or significant pleural effusion. The right-sided chest tube is unchanged projecting over the right hemidiaphragm. 2. Increased  interstitial density bilaterally is consistent with slight worsening of interstitial edema.   Electronically Signed   By: David  Martinique   On: 02/04/2014 08:03   Dg Chest Port 1 View  02/02/2014   CLINICAL DATA:  Evaluate chest tube placement  EXAM: PORTABLE CHEST - 1 VIEW  COMPARISON:  02/02/2014  FINDINGS: Interval placement of right chest tube with significant decrease in volume of right pneumothorax. Right IJ catheter tip is in the SVC. There is a right subclavian catheter with tip in the cavoatrial junction. Left chest wall pacer device is noted with leads in the right atrial appendage and right ventricle. Heart size appears mildly enlarged. There is mild diffuse edema and a small left pleural effusion.  IMPRESSION: 1. Interval placement of right chest tube with significant decrease in volume of right-sided pneumothorax.   Electronically Signed   By: Kerby Moors M.D.   On: 02/02/2014 09:53     Medications:     Scheduled Medications: . antiseptic oral rinse  7 mL Mouth Rinse BID  . aspirin EC  325 mg Oral Daily   Or  . aspirin  324 mg Per Tube Daily  . atorvastatin  40 mg Oral q1800  . bisacodyl  10 mg Oral Daily   Or  . bisacodyl  10 mg Rectal Daily  . docusate sodium  200 mg Oral Daily  . insulin aspart  0-9 Units Subcutaneous TID WC  . metoCLOPramide  5 mg Oral TID AC  . nystatin  5 mL Oral QID  . pantoprazole  40 mg Oral Daily  . sodium chloride  10-40 mL Intracatheter Q12H    Infusions: . sodium chloride Stopped (02/01/14 2000)  . sodium chloride 20 mL (02/04/14 0806)  . amiodarone 30 mg/hr (02/04/14 0807)  . DOPamine 2 mcg/kg/min (02/04/14 0800)  . lactated ringers Stopped (01/30/14 1400)  . milrinone 0.25 mcg/kg/min (02/04/14 0807)  . norepinephrine (LEVOPHED) Adult infusion Stopped (02/03/14 1400)  . dialysis replacement fluid (prismasate) 250 mL/hr at 02/03/14 1408  . dialysis replacement fluid (prismasate) 200 mL/hr at 02/03/14 2315  . dialysate (PRISMASATE)  1,000 mL/hr at 02/04/14 0826    PRN Medications: heparin, levalbuterol, metoprolol, morphine injection, ondansetron (ZOFRAN) IV, oxyCODONE, sodium chloride, sodium chloride, sorbitol, traMADol   Assessment:   1. Coronary artery disease s/p CABG/Maze10/16 2. Perioperative NSTEMI 3. A/c systolic HF with cardiogenic shock 4. iCM EF 30-35% (preop) 5. [name] now NSR s/p Maze 6. Acute on CKD, stage IV   Plan/Discussion:    Now on CVVHD. Weight down 3 pounds. Remains on pressors.   [name],[name], NP-C  9:03 AM  Getting worse today. Very tenuous. I suspect he may be getting infected. Will check bcx and ua/ucx. Start vanc/zosyn. Also start fluconazole to cover thrush. Continue CVVHD.   Back  in AF. Will continue amio. Start heparin per pharmacy. No bolus.   Prognosis guarded.   The patient is critically ill with multiple organ systems failure and requires high complexity decision making for assessment and support, frequent evaluation and titration of therapies, application of advanced monitoring technologies and extensive interpretation of multiple databases.   Critical Care Time personally devoted to patient care services described in this note is 35 Minutes.   Lanique Gonzalo,MD 10:40 AM

## 2014-02-04 NOTE — Progress Notes (Signed)
INITIAL NUTRITION ASSESSMENT  DOCUMENTATION CODES Per approved criteria  -Obesity Unspecified   INTERVENTION: Initiate Osmolite 1.5 formula at 15 ml/hr and increase by 10 ml every 4 hours to goal rate of 45 ml/hr with Prostat liquid protein 30 ml TID via tube to provide 1920 kcals, 113 gm protein, 823 ml of free water RD to follow for nutrition care plan  NUTRITION DIAGNOSIS: Inadequate oral intake related to poor appetite as evidenced by RN report  Goal: Pt to meet >/= 90% of their estimated nutrition needs   Monitor:  TF regimen & tolerance, respiratory status, weight, labs, I/O's  Reason for Assessment: Consult, Low [place]aden  80 y.o. male  Admitting Dx: severe CAD  ASSESSMENT: 80 y.o. Male with PMH of obesity, HTN, CAD, paroxysmal atrial fibrillation, systolic HF, tachybradycardia syndrome and s/p pacemaker; admitted with diagnosis of severe 2-vessel CAD and atrial fibrillation.  Patient s/p procedures 10/16: CORONARY ARTERY BYPASS GRAFTING x 3 MAZE PROCEDURE  Pt s/p trialysis catheter insertion 10/20, chest tube insertion 10/21.  RD unable to obtain nutrition hx from pt or complete Nutrition Focused Physical Exam.  Currently NPO.  Previously on a Heart Healthy/Carbohydrate Modified diet.  Per RN, PO intake has been very poor.  Receiving CVVHD.   Panda feeding tube in place.  Awaiting KUB results for tip location.  RD consulted for TF initiation & management.  Height: Ht Readings from Last 1 Encounters:  02/12/2014 5\' 11"  (1.803 m)    Weight: Wt Readings from Last 1 Encounters:  02/04/14 232 lb 5.8 oz (105.4 kg)    Ideal Body Weight: 172 lb  % Ideal Body Weight: 135%  Wt Readings from Last 10 Encounters:  02/04/14 232 lb 5.8 oz (105.4 kg)  02/04/14 232 lb 5.8 oz (105.4 kg)  02/04/14 232 lb 5.8 oz (105.4 kg)  02/04/14 232 lb 5.8 oz (105.4 kg)  01/14/14 213 lb 6 oz (96.786 kg)  01/09/14 211 lb 3.2 oz (95.8 kg)  08/31/13 220 lb (99.791 kg)  03/05/13 215 lb  (97.523 kg)  01/28/13 212 lb 12.8 oz (96.525 kg)  08/24/12 218 lb (98.884 kg)    Usual Body Weight: 220 lb  % Usual Body Weight: 105%  BMI:  Body mass index is 32.42 kg/(m^2).  Estimated Nutritional Needs: Kcal: 1850-2050 Protein: 110-120 gm Fluid: per MD  Skin: sternal, R & L leg surgical incisions   Diet Order: NPO  EDUCATION NEEDS: -No education needs identified at this time   Intake/Output Summary (Last 24 hours) at 02/04/14 1346 Last data filed at 02/04/14 1300  Gross per 24 hour  Intake 1651.01 ml  Output   4543 ml  Net -2891.99 ml    Labs:   Recent Labs Lab 02/02/14 0430  02/03/14 0400  02/03/14 1300  02/03/14 2100 02/03/14 2201 02/03/14 2223 02/04/14 0429  NA 130*  < > 139  < > 139  < > 139 137 138 140  K 3.2*  < > 3.2*  < > 3.6*  < > 3.8 3.4* 3.5* 4.1  CL 88*  < > 87*  < > 85*  < > 87* 83* 80* 91*  CO2 24  < > 38*  --  42*  --  43*  --   --  38*  BUN 51*  < > 31*  < > 26*  < > 24* 23 20 23   CREATININE 3.41*  < > 2.23*  < > 1.96*  < > 1.83* 1.80* 1.60* 1.81*  CALCIUM 9.1  < >  10.0  --  10.2  --  9.9  --   --  9.4  MG 2.4  --  2.0  --   --   --   --   --   --  2.1  PHOS 4.5  < > 2.6  --  2.3  --  2.6  --   --  2.8  GLUCOSE 159*  < > 174*  < > 182*  < > 134* 139* 164* 130*  < > = values in this interval not displayed.  CBG (last 3)   Recent Labs  02/03/14 1543 02/04/14 0807 02/04/14 1227  GLUCAP 130* 107* 139*    Scheduled Meds: . antiseptic oral rinse  7 mL Mouth Rinse BID  . aspirin EC  325 mg Oral Daily   Or  . aspirin  324 mg Per Tube Daily  . atorvastatin  40 mg Oral q1800  . bisacodyl  10 mg Oral Daily   Or  . bisacodyl  10 mg Rectal Daily  . docusate sodium  200 mg Oral Daily  . fluconazole (DIFLUCAN) IV  800 mg Intravenous Q24H  . insulin aspart  0-9 Units Subcutaneous TID WC  . metoCLOPramide  5 mg Oral TID AC  . nystatin  5 mL Oral QID  . pantoprazole  40 mg Oral Daily  . piperacillin-tazobactam  3.375 g Intravenous 4  times per day  . sodium chloride  10-40 mL Intracatheter Q12H  . vancomycin  1,000 mg Intravenous Q24H    Continuous Infusions: . sodium chloride Stopped (02/01/14 2000)  . sodium chloride 20 mL (02/04/14 0806)  . amiodarone 30 mg/hr (02/04/14 1200)  . DOPamine 2 mcg/kg/min (02/04/14 1200)  . heparin 750 Units/hr (02/04/14 1244)  . lactated ringers Stopped (01/30/14 1400)  . milrinone 0.25 mcg/kg/min (02/04/14 1200)  . norepinephrine (LEVOPHED) Adult infusion Stopped (02/03/14 1400)  . dialysis replacement fluid (prismasate) 250 mL/hr at 02/03/14 1408  . dialysis replacement fluid (prismasate) 200 mL/hr at 02/03/14 2315  . dialysate (PRISMASATE) 1,000 mL/hr at 02/04/14 2694    Past Medical History  Diagnosis Date  . Paroxysmal atrial fibrillation     s/p [employer] Insignia pacemaker; started sotalol 06/2009  . Tachy-brady syndrome   . Coronary artery disease     one vessel CAD with normal EF; Cath 12/2006. Ef normal. LM nl, LAD 40%, D2 occulded with collaterals, LCX 40%, RCA 30%; Echo 02/2009 EF 85-46% grade I diastolic dysfunction.  Marland Kitchen History of gallstones     status post cholecystectomy  . Hypertension   . Hyperlipidemia   . History of peptic ulcer disease   . Benign prostatic hypertrophy   . Obesity   . Diverticulosis of colon   . Testosterone deficiency   . ED (erectile dysfunction)   . Atrial fibrillation   . Nephrolithiasis     "passed it"  . Osteoarthritis     "legs and lower back" (01/23/2014)  . Chronic lower back pain     Past Surgical History  Procedure Laterality Date  . Insert / replace / remove pacemaker  2008  . Colonoscopy  1999  . Upper gastroduodenoscopy  1999  . Doppler evaluation    . Cardiac catheterization  ~ 2008; 01/28/2014    ; "unable to put stents in"  . Cholecystectomy  ~ 2008  . Cataract extraction, bilateral Bilateral   . Coronary artery bypass graft N/A 01/24/2014    Procedure: CORONARY ARTERY BYPASS GRAFTING (CABG), ON PUMP, TIMES  THREE, USING LEFT  INTERNAL MAMMARY ARTERY, RIGHT GREATER SAPHENOUS VEIN HARVESTED ENDOSCOPICALLY.;  Surgeon: Melrose Nakayama, MD;  Location: [facility];  Service: Open Heart Surgery;  Laterality: N/A;  . Maze N/A 02/05/2014    Procedure: MAZE;  Surgeon: Melrose Nakayama, MD;  Location: [facility];  Service: Open Heart Surgery;  Laterality: N/A;  . Intraoperative transesophageal echocardiogram N/A 01/24/2014    Procedure: INTRAOPERATIVE TRANSESOPHAGEAL ECHOCARDIOGRAM;  Surgeon: Melrose Nakayama, MD;  Location: [facility];  Service: Open Heart Surgery;  Laterality: N/A;    Arthur Holms, RD, LDN Pager #: [phone] After-Hours Pager #: [phone]

## 2014-02-04 NOTE — Progress Notes (Addendum)
MEDICATION RELATED CONSULT NOTE - INITIAL   Pharmacy Consult for heparin, vancomycin, zosyn, fluconazole Indication: afib, sepsis  Allergies  Allergen Reactions  . Sulfonamide Derivatives     REACTION: n/v    Patient Measurements: Height: 5\' 11"  (180.3 cm) Weight: 232 lb 5.8 oz (105.4 kg) IBW/kg (Calculated) : 75.3  Vital Signs: Temp: 97.7 F (36.5 C) (10/23 0800) Temp Source: Axillary (10/23 0800) BP: 98/73 mmHg (10/23 1000) Pulse Rate: 100 (10/23 1000) Intake/Output from previous day: 10/22 0701 - 10/23 0700 In: 2008.4 [P.O.:90; I.V.:1618.4; IV Piggyback:300] Out: 4767 [Urine:255; Chest Tube:20] Intake/Output from this shift: Total I/O In: 143.7 [I.V.:143.7] Out: 571 [Urine:55; Other:516]  Labs:  Recent Labs  02/02/14 0430  02/03/14 0400  02/03/14 1300  02/03/14 2100 02/03/14 2201 02/03/14 2223 02/04/14 0429  WBC 16.5*  --  17.3*  --   --   --   --   --   --  17.5*  HGB 8.2*  < > 9.3*  < >  --   < >  --  10.2* 11.2* 8.6*  HCT 23.8*  < > 29.4*  < >  --   < >  --  30.0* 33.0* 27.4*  PLT 178  --  233  --   --   --   --   --   --  275  CREATININE 3.41*  < > 2.23*  < > 1.96*  < > 1.83* 1.80* 1.60* 1.81*  MG 2.4  --  2.0  --   --   --   --   --   --  2.1  PHOS 4.5  < > 2.6  --  2.3  --  2.6  --   --  2.8  ALBUMIN 3.0*  3.0*  < > 2.9*  --  3.0*  --  3.0*  --   --  3.0*  PROT 5.9*  --   --   --   --   --   --   --   --   --   AST 107*  --   --   --   --   --   --   --   --   --   ALT 368*  --   --   --   --   --   --   --   --   --   ALKPHOS 75  --   --   --   --   --   --   --   --   --   BILITOT 1.3*  --   --   --   --   --   --   --   --   --   BILIDIR 0.5*  --   --   --   --   --   --   --   --   --   IBILI 0.8  --   --   --   --   --   --   --   --   --   < > = values in this interval not displayed. Estimated Creatinine Clearance: 39.5 ml/min (by C-G formula based on Cr of 1.81).   Microbiology: Recent Results (from the past 720 hour(s))  SURGICAL PCR  SCREEN     Status: Abnormal   Collection Time    01/27/2014 12:18 AM      Result Value Ref Range Status   MRSA, PCR NEGATIVE  NEGATIVE Final   Staphylococcus aureus POSITIVE (*) NEGATIVE Final   Comment:            The Xpert SA Assay (FDA     approved for NASAL specimens     in patients over 78 years of age),     is one component of     a comprehensive surveillance     program.  Test performance has     been validated by [employer] for patients greater     than or equal to 79 year old.     It is not intended     to diagnose infection nor to     guide or monitor treatment.  CLOSTRIDIUM DIFFICILE BY PCR     Status: None   Collection Time    01/31/14  6:37 PM      Result Value Ref Range Status   C difficile by pcr NEGATIVE  NEGATIVE Final    Medical History: Past Medical History  Diagnosis Date  . Paroxysmal atrial fibrillation     s/p [employer] Insignia pacemaker; started sotalol 06/2009  . Tachy-brady syndrome   . Coronary artery disease     one vessel CAD with normal EF; Cath 12/2006. Ef normal. LM nl, LAD 40%, D2 occulded with collaterals, LCX 40%, RCA 30%; Echo 02/2009 EF 63-01% grade I diastolic dysfunction.  Marland Kitchen History of gallstones     status post cholecystectomy  . Hypertension   . Hyperlipidemia   . History of peptic ulcer disease   . Benign prostatic hypertrophy   . Obesity   . Diverticulosis of colon   . Testosterone deficiency   . ED (erectile dysfunction)   . Atrial fibrillation   . Nephrolithiasis     "passed it"  . Osteoarthritis     "legs and lower back" (01/25/2014)  . Chronic lower back pain     Medications:  Anti-infectives   Start     Dose/Rate Route Frequency Ordered Stop   01/24/2014 2330  vancomycin (VANCOCIN) IVPB 1000 mg/200 mL premix     1,000 mg 200 mL/hr over 60 Minutes Intravenous  Once 02/08/2014 1631 01/28/14 0040   02/03/2014 2000  cefUROXime (ZINACEF) 1.5 g in dextrose 5 % 50 mL IVPB     1.5 g 100 mL/hr over 30 Minutes Intravenous  Every 12 hours 01/29/2014 1630 01/29/14 0822   01/26/2014 0400  vancomycin (VANCOCIN) 1,250 mg in sodium chloride 0.9 % 250 mL IVPB     1,250 mg 166.7 mL/hr over 90 Minutes Intravenous To Surgery 01/26/14 1417 01/23/2014 0737   02/04/2014 0400  cefUROXime (ZINACEF) 1.5 g in dextrose 5 % 50 mL IVPB     1.5 g 100 mL/hr over 30 Minutes Intravenous To Surgery 01/26/14 1417 01/25/2014 1517   02/09/2014 0400  cefUROXime (ZINACEF) 750 mg in dextrose 5 % 50 mL IVPB  Status:  Discontinued     750 mg 100 mL/hr over 30 Minutes Intravenous To Surgery 01/26/14 1416 01/15/2014 1630   01/26/2014 0400  vancomycin (VANCOCIN) 1,250 mg in sodium chloride 0.9 % 250 mL IVPB     1,250 mg 166.7 mL/hr over 90 Minutes Intravenous To Surgery 01/23/14 1110 01/25/14 0400   01/26/2014 0400  cefUROXime (ZINACEF) 1.5 g in dextrose 5 % 50 mL IVPB     1.5 g 100 mL/hr over 30 Minutes Intravenous To Surgery 01/23/14 1110 01/25/14 0400   01/26/2014 0400  cefUROXime (ZINACEF) 750 mg in dextrose 5 % 50 mL  IVPB     750 mg 100 mL/hr over 30 Minutes Intravenous To Surgery 01/23/14 1109 01/25/14 0400      Assessment: 80 yom initially admitted for a cardiac cath but then underwent CABGx3 on 10/15. Pt now worsening and to start broad spectrum antibiotics for possible sepsis coverage. Continues on CVVHDF. Pt was also on warfarin PTA for afib. This has been on hold. To start IV heparin without a bolus for anticoagulation. H/H is low but platelets are WNL and CBC has been stable. Pt has previously been therapeutic on heparin at a rate of 750mg  units/hr.  Vanc 10/23>> Zosyn 10/23>> Flucon 10/23>>  Goal of Therapy:  Vancomycin trough 15-20 Heparin level 0.3-0.7 Monitor platelets  Plan:  1. Zosyn 3.375gm IV Q6H 2. Vancomycin 1000mg  IV Q24H 3. Fluconazole 800mg  IV Q24H 4. F/u renal plans, C&S, clinical status and trough at SS 5. Heparin gtt 750units/hr 6. Check an 8 hour HL 7. Daily HL and CBC 8. F/u oral AC plans   Nahla Lukin, Rande Lawman 02/04/2014,10:49 AM

## 2014-02-04 NOTE — Progress Notes (Signed)
ANTICOAGULATION CONSULT NOTE - Follow Up Consult  Pharmacy Consult for Heparin Indication: atrial fibrillation  Allergies  Allergen Reactions  . Sulfonamide Derivatives     REACTION: n/v    Patient Measurements: Height: 5\' 11"  (180.3 cm) Weight: 232 lb 5.8 oz (105.4 kg) IBW/kg (Calculated) : 75.3 Heparin Dosing Weight: 97.5 kg  Vital Signs: Temp: 97.6 F (36.4 C) (10/23 2000) Temp Source: Oral (10/23 2000) BP: 95/61 mmHg (10/23 2000) Pulse Rate: 117 (10/23 2000)  Labs:  Recent Labs  02/02/14 0430  02/03/14 0400  02/03/14 2201 02/03/14 2223 02/04/14 0429 02/04/14 1300 02/04/14 1552 02/04/14 1647 02/04/14 1930  HGB 8.2*  < > 9.3*  < > 10.2* 11.2* 8.6*  --   --   --   --   HCT 23.8*  < > 29.4*  < > 30.0* 33.0* 27.4*  --   --   --   --   PLT 178  --  233  --   --   --  275  --   --   --   --   HEPARINUNFRC  --   --   --   --   --   --   --   --   --   --  <0.10*  CREATININE 3.41*  < > 2.23*  < > 1.80* 1.60* 1.81* 1.69* QUESTIONABLE RESULTS, RECOMMEND RECOLLECT TO VERIFY 1.68*  --   < > = values in this interval not displayed.  Estimated Creatinine Clearance: 42.6 ml/min (by C-G formula based on Cr of 1.68).   Medications:  Infusions:  . sodium chloride Stopped (02/01/14 2000)  . sodium chloride 20 mL (02/04/14 0806)  . amiodarone 30 mg/hr (02/04/14 1716)  . DOPamine 2 mcg/kg/min (02/04/14 1600)  . feeding supplement (OSMOLITE 1.5 CAL)    . heparin 750 Units/hr (02/04/14 1600)  . lactated ringers Stopped (01/30/14 1400)  . milrinone 0.25 mcg/kg/min (02/04/14 1600)  . norepinephrine (LEVOPHED) Adult infusion Stopped (02/03/14 1400)  . dialysis replacement fluid (prismasate) 250 mL/hr at 02/04/14 1459  . dialysis replacement fluid (prismasate) 200 mL/hr at 02/03/14 2315  . dialysate (PRISMASATE) 1,000 mL/hr at 02/04/14 1459    Assessment: 80 year old male on IV heparin for atrial fibrillation.  H/H 8.6/27.4, plts 275, no overt bleeding noted per RN.  Initial  heparin level is sub-therapeutic. No bolus due to low CBC. No infusions issues per RN. Patient is on CVVHD.  Goal of Therapy:  Heparin level 0.3-0.7 units/ml Monitor platelets by anticoagulation protocol: Yes   Plan:  1. Increase heparin to 1050 units/hr. 2. Recheck heparin level in 8 hours (ok to do with AM labs) 3. Daily heparin level and CBC while on therapy.  Sloan Leiter, PharmD, BCPS Clinical Pharmacist [phone] 02/04/2014,8:45 PM

## 2014-02-04 NOTE — Progress Notes (Signed)
Paged Dr. Mercy Moore to advise of critical lab results:  CO2 43 and Post Filter iCa 0.73.  Also advised of ABG results:  Results for CADARIUS, NEVARES (MRN [id number]) as of 02/04/2014 00:29  Ref. Range 02/03/2014 22:00  Sample type No range found ARTERIAL  pH, Arterial Latest Range: 7.350-7.450  7.587 (H)  pCO2 arterial Latest Range: 35.0-45.0 mmHg 49.7 (H)  pO2, Arterial Latest Range: 80.0-100.0 mmHg 60.0 (L)  Bicarbonate Latest Range: 20.0-24.0 mEq/L 47.6 (H)  TCO2 Latest Range: 0-100 mmol/L 49  Acid-Base Excess Latest Range: 0.0-2.0 mmol/L 23.0 (H)  O2 Saturation No range found 94.0  Patient temperature No range found 97.4 F  Collection site No range found ARTERIAL LINE    Received orders to stop Calcium Gluconate and Sodium Citrate.  Received orders to replace pre filter fluids to Prismasol BGK 4/2.5 at 26ml/hr.  Will initiate orders and continue to monitor.

## 2014-02-04 NOTE — Progress Notes (Signed)
[facility] BRIEF SICU PROGRESS NOTE  8 Days Post-Op  S/P Procedure(s) (LRB): CORONARY ARTERY BYPASS GRAFTING (CABG), ON PUMP, TIMES THREE, USING LEFT INTERNAL MAMMARY ARTERY, RIGHT GREATER SAPHENOUS VEIN HARVESTED ENDOSCOPICALLY. (N/A) MAZE (N/A) INTRAOPERATIVE TRANSESOPHAGEAL ECHOCARDIOGRAM (N/A)   Fairly stable day Currently in Afi w/ controlled rate, BP stable O2 sats 100% on 2 L/min Remains oliguric Tolerating CVVHD  Plan: Continue current plan  Angala Hilgers H 02/04/2014 4:10 PM

## 2014-02-05 ENCOUNTER — Inpatient Hospital Stay (HOSPITAL_COMMUNITY): Payer: Medicare HMO

## 2014-02-05 DIAGNOSIS — R570 Cardiogenic shock: Secondary | ICD-10-CM

## 2014-02-05 LAB — COMPREHENSIVE METABOLIC PANEL
ALT: 154 U/L — AB (ref 0–53)
AST: 58 U/L — AB (ref 0–37)
Albumin: 2.9 g/dL — ABNORMAL LOW (ref 3.5–5.2)
Alkaline Phosphatase: 88 U/L (ref 39–117)
Anion gap: 13 (ref 5–15)
BUN: 29 mg/dL — ABNORMAL HIGH (ref 6–23)
CALCIUM: 8.7 mg/dL (ref 8.4–10.5)
CO2: 29 mEq/L (ref 19–32)
Chloride: 94 mEq/L — ABNORMAL LOW (ref 96–112)
Creatinine, Ser: 1.81 mg/dL — ABNORMAL HIGH (ref 0.50–1.35)
GFR calc Af Amer: 39 mL/min — ABNORMAL LOW (ref >90)
GFR calc non Af Amer: 33 mL/min — ABNORMAL LOW (ref >90)
Glucose, Bld: 137 mg/dL — ABNORMAL HIGH (ref 70–99)
Potassium: 4 mEq/L (ref 3.7–5.3)
SODIUM: 136 meq/L — AB (ref 137–147)
TOTAL PROTEIN: 6.1 g/dL (ref 6.0–8.3)
Total Bilirubin: 2.5 mg/dL — ABNORMAL HIGH (ref 0.3–1.2)

## 2014-02-05 LAB — POCT I-STAT 3, ART BLOOD GAS (G3+)
ACID-BASE EXCESS: 7 mmol/L — AB (ref 0.0–2.0)
Acid-Base Excess: 4 mmol/L — ABNORMAL HIGH (ref 0.0–2.0)
BICARBONATE: 27.5 meq/L — AB (ref 20.0–24.0)
Bicarbonate: 31.4 mEq/L — ABNORMAL HIGH (ref 20.0–24.0)
O2 SAT: 91 %
O2 Saturation: 92 %
PCO2 ART: 35.9 mmHg (ref 35.0–45.0)
PO2 ART: 57 mmHg — AB (ref 80.0–100.0)
Patient temperature: 98.2
TCO2: 33 mmol/L (ref 0–100)
TCO2: 29 mmol/L (ref 0–100)
pCO2 arterial: 40.3 mmHg (ref 35.0–45.0)
pH, Arterial: 7.498 — ABNORMAL HIGH (ref 7.350–7.450)
pH, Arterial: 7.492 — ABNORMAL HIGH (ref 7.350–7.450)
pO2, Arterial: 55 mmHg — ABNORMAL LOW (ref 80.0–100.0)

## 2014-02-05 LAB — CARBOXYHEMOGLOBIN
CARBOXYHEMOGLOBIN: 2.1 % — AB (ref 0.5–1.5)
Carboxyhemoglobin: 2.4 % — ABNORMAL HIGH (ref 0.5–1.5)
Methemoglobin: 1.2 % (ref 0.0–1.5)
Methemoglobin: 0.8 % (ref 0.0–1.5)
O2 SAT: 45 %
O2 SAT: 43.6 %
Total hemoglobin: 8.7 g/dL — ABNORMAL LOW (ref 13.5–18.0)
Total hemoglobin: 8.3 g/dL — ABNORMAL LOW (ref 13.5–18.0)

## 2014-02-05 LAB — RENAL FUNCTION PANEL
ANION GAP: 12 (ref 5–15)
Albumin: 2.2 g/dL — ABNORMAL LOW (ref 3.5–5.2)
Albumin: 3.1 g/dL — ABNORMAL LOW (ref 3.5–5.2)
Anion gap: 13 (ref 5–15)
BUN: 27 mg/dL — AB (ref 6–23)
BUN: 33 mg/dL — ABNORMAL HIGH (ref 6–23)
CHLORIDE: 106 meq/L (ref 96–112)
CO2: 23 mEq/L (ref 19–32)
CO2: 28 mEq/L (ref 19–32)
CREATININE: 1.5 mg/dL — AB (ref 0.50–1.35)
Calcium: 6.5 mg/dL — ABNORMAL LOW (ref 8.4–10.5)
Calcium: 8.4 mg/dL (ref 8.4–10.5)
Chloride: 96 mEq/L (ref 96–112)
Creatinine, Ser: 1.86 mg/dL — ABNORMAL HIGH (ref 0.50–1.35)
GFR calc Af Amer: 49 mL/min — ABNORMAL LOW (ref >90)
GFR calc non Af Amer: 32 mL/min — ABNORMAL LOW (ref >90)
GFR, EST AFRICAN AMERICAN: 37 mL/min — AB (ref >90)
GFR, EST NON AFRICAN AMERICAN: 42 mL/min — AB (ref >90)
GLUCOSE: 128 mg/dL — AB (ref 70–99)
Glucose, Bld: 119 mg/dL — ABNORMAL HIGH (ref 70–99)
Phosphorus: 2 mg/dL — ABNORMAL LOW (ref 2.3–4.6)
Phosphorus: 2 mg/dL — ABNORMAL LOW (ref 2.3–4.6)
Potassium: 3.3 mEq/L — ABNORMAL LOW (ref 3.7–5.3)
Potassium: 4 mEq/L (ref 3.7–5.3)
SODIUM: 136 meq/L — AB (ref 137–147)
Sodium: 142 mEq/L (ref 137–147)

## 2014-02-05 LAB — CBC
HEMATOCRIT: 27.7 % — AB (ref 39.0–52.0)
Hemoglobin: 8.6 g/dL — ABNORMAL LOW (ref 13.0–17.0)
MCH: 29.9 pg (ref 26.0–34.0)
MCHC: 31 g/dL (ref 30.0–36.0)
MCV: 96.2 fL (ref 78.0–100.0)
Platelets: 338 10*3/uL (ref 150–400)
RBC: 2.88 MIL/uL — AB (ref 4.22–5.81)
RDW: 19.3 % — ABNORMAL HIGH (ref 11.5–15.5)
WBC: 21 10*3/uL — AB (ref 4.0–10.5)

## 2014-02-05 LAB — URINE CULTURE
COLONY COUNT: NO GROWTH
CULTURE: NO GROWTH

## 2014-02-05 LAB — GLUCOSE, CAPILLARY
GLUCOSE-CAPILLARY: 139 mg/dL — AB (ref 70–99)
Glucose-Capillary: 118 mg/dL — ABNORMAL HIGH (ref 70–99)

## 2014-02-05 LAB — MAGNESIUM: Magnesium: 2.3 mg/dL (ref 1.5–2.5)

## 2014-02-05 LAB — HEPARIN LEVEL (UNFRACTIONATED)
HEPARIN UNFRACTIONATED: 0.22 [IU]/mL — AB (ref 0.30–0.70)
Heparin Unfractionated: 0.33 IU/mL (ref 0.30–0.70)
Heparin Unfractionated: 0.52 IU/mL (ref 0.30–0.70)

## 2014-02-05 MED ORDER — ALBUMIN HUMAN 5 % IV SOLN
INTRAVENOUS | Status: AC
  Filled 2014-02-05: qty 250

## 2014-02-05 MED ORDER — PANTOPRAZOLE SODIUM 40 MG PO PACK
40.0000 mg | PACK | Freq: Every day | ORAL | Status: DC
  Administered 2014-02-06: 40 mg
  Filled 2014-02-05: qty 20

## 2014-02-05 MED ORDER — AMIODARONE LOAD VIA INFUSION
150.0000 mg | Freq: Once | INTRAVENOUS | Status: AC
  Administered 2014-02-05: 150 mg via INTRAVENOUS

## 2014-02-05 MED ORDER — ALBUMIN HUMAN 5 % IV SOLN
12.5000 g | Freq: Once | INTRAVENOUS | Status: AC
  Administered 2014-02-05: 12.5 g via INTRAVENOUS

## 2014-02-05 MED ORDER — DOCUSATE SODIUM 50 MG/5ML PO LIQD
200.0000 mg | Freq: Every day | ORAL | Status: DC
  Administered 2014-02-06: 200 mg
  Filled 2014-02-05: qty 20

## 2014-02-05 NOTE — Progress Notes (Signed)
ANTICOAGULATION CONSULT NOTE - Follow Up Consult  Pharmacy Consult for heparin Indication: atrial fibrillation  Labs:  Recent Labs  02/03/14 0400  02/03/14 2223 02/04/14 0429  02/04/14 1552 02/04/14 1647 02/04/14 1930 02/05/14 0419 02/05/14 0420  HGB 9.3*  < > 11.2* 8.6*  --   --   --   --  8.6*  --   HCT 29.4*  < > 33.0* 27.4*  --   --   --   --  27.7*  --   PLT 233  --   --  275  --   --   --   --  338  --   HEPARINUNFRC  --   --   --   --   --   --   --  <0.10*  --  0.33  CREATININE 2.23*  < > 1.60* 1.81*  < > QUESTIONABLE RESULTS, RECOMMEND RECOLLECT TO VERIFY 1.68*  --  1.81*  --   < > = values in this interval not displayed.   Assessment/Plan:  80yo male therapeutic on heparin after rate increase. Will continue gtt at current rate and confirm stable with additional level.   Wynona Neat, PharmD, BCPS  02/05/2014,5:33 AM

## 2014-02-05 NOTE — Progress Notes (Signed)
ANTICOAGULATION CONSULT NOTE - Follow Up Consult  Pharmacy Consult for Heparin Indication: atrial fibrillation  Allergies  Allergen Reactions  . Sulfonamide Derivatives     REACTION: n/v    Patient Measurements: Height: 5\' 11"  (180.3 cm) Weight: 217 lb 6 oz (98.6 kg) IBW/kg (Calculated) : 75.3 Heparin Dosing Weight: 97.5 kg  Vital Signs: Temp: 98.3 F (36.8 C) (10/24 1500) Temp Source: Axillary (10/24 1500) BP: 115/49 mmHg (10/24 2000) Pulse Rate: 83 (10/24 2000)  Labs:  Recent Labs  02/03/14 0400  02/03/14 2223 02/04/14 0429  02/04/14 1647  02/05/14 0419 02/05/14 0420 02/05/14 1150 02/05/14 1900  HGB 9.3*  < > 11.2* 8.6*  --   --   --  8.6*  --   --   --   HCT 29.4*  < > 33.0* 27.4*  --   --   --  27.7*  --   --   --   PLT 233  --   --  275  --   --   --  338  --   --   --   HEPARINUNFRC  --   --   --   --   --   --   < >  --  0.33 0.22* 0.52  CREATININE 2.23*  < > 1.60* 1.81*  < > 1.68*  --  1.81*  --  1.50*  --   < > = values in this interval not displayed.  Estimated Creatinine Clearance: 46.2 ml/min (by C-G formula based on Cr of 1.5).   Medications:  Infusions:  . amiodarone 30 mg/hr (02/05/14 1600)  . DOPamine 3 mcg/kg/min (02/05/14 1600)  . feeding supplement (OSMOLITE 1.5 CAL) 1,000 mL (02/04/14 2054)  . heparin 1,200 Units/hr (02/05/14 1600)  . milrinone 0.375 mcg/kg/min (02/05/14 1600)  . norepinephrine (LEVOPHED) Adult infusion 5 mcg/min (02/05/14 1800)  . dialysis replacement fluid (prismasate) 250 mL/hr at 02/05/14 1212  . dialysis replacement fluid (prismasate) 200 mL/hr at 02/05/14 1212  . dialysate (PRISMASATE) 1,000 mL/hr at 02/05/14 1542    Assessment: 80 year old male on IV heparin for atrial fibrillation. Heparin level (0.52) is therapeutic on 1200 units/hr H/H 8.6/27.7, plts 338, Patient is on CVVHD.  Goal of Therapy:  Heparin level 0.3-0.7 units/ml Monitor platelets by anticoagulation protocol: Yes   Plan:  - Continue heparin  at current rate - Daily heparin level and CBC while on therapy.  Maryanna Shape, PharmD, BCPS  Clinical Pharmacist  Pager: [phone]   02/05/2014,8:33 PM

## 2014-02-05 NOTE — Progress Notes (Addendum)
Patient ID: Francisco Merritt, male   DOB: 1929/12/30, 80 y.o.   MRN: [id number]     Subjective:    + SOB  Objective:   Temp:  [96.6 F (35.9 C)-97.8 F (36.6 C)] 97.1 F (36.2 C) (10/24 0700) Pulse Rate:  [72-130] 84 (10/24 0900) Resp:  [12-29] 23 (10/24 0900) BP: (75-117)/(40-99) 117/99 mmHg (10/24 0900) SpO2:  [95 %-100 %] 100 % (10/24 0900) Arterial Line BP: (84-120)/(40-83) 84/48 mmHg (10/24 0900) Weight:  [217 lb 6 oz (98.6 kg)] 217 lb 6 oz (98.6 kg) (10/24 0600) Last BM Date: 02/01/14  Filed Weights   02/03/14 0500 02/04/14 0500 02/05/14 0600  Weight: 235 lb 14.3 oz (107 kg) 232 lb 5.8 oz (105.4 kg) 217 lb 6 oz (98.6 kg)    Intake/Output Summary (Last 24 hours) at 02/05/14 0958 Last data filed at 02/05/14 0900  Gross per 24 hour  Intake 2643.8 ml  Output   5559 ml  Net -2915.2 ml    Telemetry: afib, elevated rates  Exam:  General: NAD  Resp: coarse bilaterally  Cardiac: irreg, no m/r/g,  GI: abdomen soft, NT, ND  MSK: warm, trace bilateral edema   Lab Results:  Basic Metabolic Panel:  Recent Labs Lab 02/03/14 0400  02/04/14 0429  02/04/14 1552 02/04/14 1647 02/05/14 0419  NA 139  < > 140  < > QUESTIONABLE RESULTS, RECOMMEND RECOLLECT TO VERIFY 140 136*  K 3.2*  < > 4.1  < > QUESTIONABLE RESULTS, RECOMMEND RECOLLECT TO VERIFY 4.3 4.0  CL 87*  < > 91*  < > QUESTIONABLE RESULTS, RECOMMEND RECOLLECT TO VERIFY 97 94*  CO2 38*  < > 38*  < > QUESTIONABLE RESULTS, RECOMMEND RECOLLECT TO VERIFY 33* 29  GLUCOSE 174*  < > 130*  < > QUESTIONABLE RESULTS, RECOMMEND RECOLLECT TO VERIFY 87 137*  BUN 31*  < > 23  < > QUESTIONABLE RESULTS, RECOMMEND RECOLLECT TO VERIFY 23 29*  CREATININE 2.23*  < > 1.81*  < > QUESTIONABLE RESULTS, RECOMMEND RECOLLECT TO VERIFY 1.68* 1.81*  CALCIUM 10.0  < > 9.4  < > QUESTIONABLE RESULTS, RECOMMEND RECOLLECT TO VERIFY 8.7 8.7  MG 2.0  --  2.1  --   --   --  2.3  < > = values in this interval not displayed.  Liver Function  Tests:  Recent Labs Lab 02/01/14 0335 02/02/14 0430  02/04/14 1552 02/04/14 1647 02/05/14 0419  AST 182* 107*  --   --   --  58*  ALT 495* 368*  --   --   --  154*  ALKPHOS 68 75  --   --   --  88  BILITOT 1.0 1.3*  --   --   --  2.5*  PROT 5.7* 5.9*  --   --   --  6.1  ALBUMIN 3.1* 3.0*  3.0*  < > QUESTIONABLE RESULTS, RECOMMEND RECOLLECT TO VERIFY 3.1* 2.9*  < > = values in this interval not displayed.  CBC:  Recent Labs Lab 02/03/14 0400  02/03/14 2223 02/04/14 0429 02/05/14 0419  WBC 17.3*  --   --  17.5* 21.0*  HGB 9.3*  < > 11.2* 8.6* 8.6*  HCT 29.4*  < > 33.0* 27.4* 27.7*  MCV 91.9  --   --  95.1 96.2  PLT 233  --   --  275 338  < > = values in this interval not displayed.  Cardiac Enzymes:  Recent Labs Lab 01/29/14 1545  CKTOTAL  347*  CKMB 20.1*  TROPONINI >20.00*    BNP:  Recent Labs  01/06/14 1305 01/25/2014 0540  PROBNP 2171.0* 1919.0*    Coagulation: No results found for this basename: INR,  in the last 168 hours  ECG:   Medications:   Scheduled Medications: . antiseptic oral rinse  7 mL Mouth Rinse BID  . aspirin EC  325 mg Oral Daily   Or  . aspirin  324 mg Per Tube Daily  . atorvastatin  40 mg Oral q1800  . bisacodyl  10 mg Oral Daily   Or  . bisacodyl  10 mg Rectal Daily  . docusate sodium  200 mg Oral Daily  . feeding supplement (PRO-STAT SUGAR FREE 64)  30 mL Per Tube TID  . fluconazole (DIFLUCAN) IV  800 mg Intravenous Q24H  . insulin aspart  0-9 Units Subcutaneous TID WC  . metoCLOPramide  5 mg Oral TID AC  . nystatin  5 mL Oral QID  . pantoprazole  40 mg Oral Daily  . piperacillin-tazobactam  3.375 g Intravenous 4 times per day  . sodium chloride  10-40 mL Intracatheter Q12H  . vancomycin  1,000 mg Intravenous Q24H     Infusions: . sodium chloride Stopped (02/01/14 2000)  . sodium chloride 20 mL (02/04/14 0806)  . amiodarone 30 mg/hr (02/05/14 0957)  . DOPamine 3 mcg/kg/min (02/05/14 0931)  . feeding supplement  (OSMOLITE 1.5 CAL) 1,000 mL (02/04/14 2054)  . heparin 1,050 Units/hr (02/05/14 0947)  . lactated ringers Stopped (01/30/14 1400)  . milrinone 0.25 mcg/kg/min (02/05/14 0800)  . norepinephrine (LEVOPHED) Adult infusion Stopped (02/03/14 1400)  . dialysis replacement fluid (prismasate) 250 mL/hr at 02/04/14 2112  . dialysis replacement fluid (prismasate) 200 mL/hr at 02/03/14 2315  . dialysate (PRISMASATE) 1,000 mL/hr at 02/05/14 0609     PRN Medications:  heparin, levalbuterol, morphine injection, ondansetron (ZOFRAN) IV, oxyCODONE, sodium chloride, sodium chloride, sorbitol, traMADol     Assessment/Plan    1. CAD/Cardiogenic shock - s/p CABG, suffered postop MI with systolic HF and cardiogenic shock - echo 02/01/14 with poor visualization even with definity, LV function appears to be mild to moderately reduced - pt on milrinone and dopamine, variable MAPS with some hypotension in early AM, rencelt MAPS 80-100s. Started on emperic abx yesterday due to concern for developing sepsis with uptrending WBC. - venous O2 sat 45%, dopamine had been down titrated to 2 mcg/kg/min yesterday. Can increase milrinone if bp tolerates, if drop in bp would suggest titrating up dopamine to increase CO while same time supporting his MAPs.  - repeat co-ox 1 pm  2. Afib - on amiodarone, s/p MAZE procedure during recent CABG. On hep gtt. Elevated heart rates in setting of shock and possible sepsis, also contributing factor of milrinone and dopamine - continue amio for now, given acute processess phsyiologically expect some tachycardia.   3. AKI - developed AKI with reported 40 lbs weight gain, currently on CVVHD, followed by nephrology  4. Leukocytosis - increasing WBC, unclear source of infection. Started on broad spectrum abx yesterday. Would recommend critical care consult to help with management  5. Hypoxia. - pO2 this AM in 50s on [place],tenous respiratory status. CXR with low lung volumes and  atelectasis, bilatearl edema - fluid removal by CVVHD as tolerated, recommend critical care consult to help follow closely his respiratory status.   4. Shock liver - transaminases trending down, continue hemodynamic support Carlyle Dolly, M.D.

## 2014-02-05 NOTE — Progress Notes (Addendum)
[address] FallsSuite [address]       [place] [address]             [phone]        CARDIOTHORACIC SURGERY PROGRESS NOTE   R9 Days Post-Op Procedure(s) (LRB): CORONARY ARTERY BYPASS GRAFTING (CABG), ON PUMP, TIMES THREE, USING LEFT INTERNAL MAMMARY ARTERY, RIGHT GREATER SAPHENOUS VEIN HARVESTED ENDOSCOPICALLY. (N/A) MAZE (N/A) INTRAOPERATIVE TRANSESOPHAGEAL ECHOCARDIOGRAM (N/A)  Subjective: No specific complaints.  Extremely weak.  Denies pain in chest or abdomen.  Denies SOB.  + bowel movement yesterday  Objective: Vital signs: BP Readings from Last 1 Encounters:  02/05/14 86/46   Pulse Readings from Last 1 Encounters:  02/05/14 99   Resp Readings from Last 1 Encounters:  02/05/14 21   Temp Readings from Last 1 Encounters:  02/05/14 97.1 F (36.2 C) Axillary    Hemodynamics: CVP:  [12 mmHg-18 mmHg] 14 mmHg  Mixed venous co-ox 45%  Physical Exam:  Rhythm:   Afib  Breath sounds: Diminished at bases  Heart sounds:  irregular  Incisions:  Clean and dry, sternum stable  Abdomen:  Soft, mildly distended, non-tender  Extremities:  Cool in periphery but adequately perfused   Intake/Output from previous day: 10/23 0701 - 10/24 0700 In: 2502.8 [I.V.:1187.8; NG/GT:515; IV Piggyback:800] Out: 5552 [Urine:230; Stool:1; Chest Tube:130] Intake/Output this shift: Total I/O In: 342.3 [I.V.:177.3; NG/GT:165] Out: 370 [Urine:15; Other:355]  Lab Results:  CBC: Recent Labs  02/04/14 0429 02/05/14 0419  WBC 17.5* 21.0*  HGB 8.6* 8.6*  HCT 27.4* 27.7*  PLT 275 338    BMET:  Recent Labs  02/04/14 1647 02/05/14 0419  NA 140 136*  K 4.3 4.0  CL 97 94*  CO2 33* 29  GLUCOSE 87 137*  BUN 23 29*  CREATININE 1.68* 1.81*  CALCIUM 8.7 8.7     CBG (last 3)   Recent Labs  02/04/14 1227 02/04/14 1558 02/05/14 0802  GLUCAP 139* 72 118*    ABG    Component Value Date/Time   PHART 7.498* 02/05/2014 0413   PCO2ART 40.3 02/05/2014 0413   PO2ART 57.0*  02/05/2014 0413   HCO3 31.4* 02/05/2014 0413   TCO2 33 02/05/2014 0413   ACIDBASEDEF 1.0 02/02/2014 0013   O2SAT 45.0 02/05/2014 0622    CXR: PORTABLE CHEST - 1 VIEW  COMPARISON: 02/04/2014  FINDINGS:  A feeding tube is been placed and extends down into the stomach.  Right IJ line tip: Brachiocephalic confluence. Right subclavian line  tip: Right atrium. Dual lead pacer remains in place.  Right basilar chest tube noted. Possible small left pneumothorax  medially at the left lung apex. Vertical gas collection projecting  centrally over the chest, new from prior is, cannot exclude sternal  dehiscence or pneumomediastinum. No sternal wire migration  currently.  Obscuration of the left lung base by airspace opacity. Bilateral  interstitial opacities are present.  Atrial clip and postoperative findings from prior CABG noted.  IMPRESSION:  1. Suspected small left apical pneumothorax medially.  2. Linear low density projecting centrally over the sternum may  represent gas in the sternal wound or pneumomediastinum. I cannot  exclude sternal dehiscence, although no sternal wire migration is  observed. Correlate with clinical appearance.  3. No right-sided pneumothorax currently. A right-sided chest tube  is in place.  4. Low lung volumes with bibasilar (left greater than right)  atelectasis and bilateral interstitial edema.  5. Interval placement of a feeding tube.  Electronically Signed  By: Franchot Mimes  Janeece Fitting M.D.  On: 02/05/2014 08:36   Assessment/Plan: S/P Procedure(s) (LRB): CORONARY ARTERY BYPASS GRAFTING (CABG), ON PUMP, TIMES THREE, USING LEFT INTERNAL MAMMARY ARTERY, RIGHT GREATER SAPHENOUS VEIN HARVESTED ENDOSCOPICALLY. (N/A) MAZE (N/A) INTRAOPERATIVE TRANSESOPHAGEAL ECHOCARDIOGRAM (N/A)  CV:  Remains in Afib w/ adequate rate control, BP adequate, co-ox back down to 45% w/ acute combined systolic and diastolic CHF, likely ongoing cardiogenic +/- septic shock, CVP  stable/decreased  Will increase milrinone 0.375  Continue low dose dopamine  I favor backing off of volume removal to keep CVP up slightly  Continue IV amiodarone  Continue IV heparin  RESP:  oxygenation adequate although slightly worse, CXR stable - remains tenuous but stable off vent  Monitor closely  Will ask Pulm/CCM team to assist if condition deteriorates any further   ID:  WBC continues to rise w/out clear source.  Now day #1 empiric Vanc/Zosyn/Diflucan.  Cultures pending.  Patient isn't coughing much and CXR w/out infiltrate. Mediastinal air noted on CXR but sternal incision looks good and no signs of instability.  Abdominal exam benign and some signs of bowel function.  Central lines placed 10/18 and 10/20.  Old Foley cath in place.  D/C old Foley cath  Monitor sternal incision closely  RENAL:  Remains in oliguric renal failure.  Tolerating CVVHD.  Electrolytes stable.  I/O's negative 3 liters again yesterday.  Weight trending down.  I favor backing off of volume removal given fall in co-ox and relatively low CVP  HEME:  Expected post op acute blood loss anemia, stable.  WBC rising.  Platelet count normal.  FEN:  Severely protein/calorie depletion/malnutrition, poor po intake, tolerating tube feeds so far  NEURO:  Stable  PNEUMOTHORAX:  Resolved after chest tube placement.  Leave tube in place for now  DISP:  Patient remains critically ill.  Family updated at bedside.   Havilah Topor H 02/05/2014 10:26 AM

## 2014-02-05 NOTE — Progress Notes (Addendum)
Since titration change of Milrinone to 0.375, pt's blood pressure has been sustaining with MAPs in the low 50's (correlating between A line and BP cuff). Order to Albumin received. Will monitor- keeping even on CRRT for now  Francisco Merritt

## 2014-02-05 NOTE — Progress Notes (Signed)
[facility] BRIEF SICU PROGRESS NOTE  9 Days Post-Op  S/P Procedure(s) (LRB): CORONARY ARTERY BYPASS GRAFTING (CABG), ON PUMP, TIMES THREE, USING LEFT INTERNAL MAMMARY ARTERY, RIGHT GREATER SAPHENOUS VEIN HARVESTED ENDOSCOPICALLY. (N/A) MAZE (N/A) INTRAOPERATIVE TRANSESOPHAGEAL ECHOCARDIOGRAM (N/A)   Remains in Afib w/ controlled rate BP decreased earlier, now on low dose levophed @ 5 w/ MAP > 70 Extremities feel warm w/ good perfusion but co-ox unchanged I/O's essentially balanced so far today  Plan: Continue current plan.   Crystal Ellwood H 02/05/2014 6:51 PM

## 2014-02-05 NOTE — Progress Notes (Signed)
Patient ID: Francisco Merritt, male    DOB: 09-02-1929, 80 y.o.   MRN: [id number] Nephrology Progress Note  Assessment/Plan: 80 y.o. male with PMH CKD, CAD, [name], CHF, HTN; underwent 3v CABG and MAZE on 54/65 complicated by postop MI requiring pressor and inotrope support. Inadequate diuresis and fluid overload, now receiving CVVHD.  1. Acute on CKD and fluid overload: Postop MI and cardiogenic shock. Cont CRRT, fluid removal as tolerated. Net negative -7.9L. Decrease UF goal to 50cc/hr as he is down 14kg. UOP still low. 2. Vascular access: right IJ.  3. Hypokalemia: resolved currently. 4. Metabolic alkalosis: citrate d/c'd, may be hypoventilated as well for compensation, will need to monitor respiratory status closely 5. Acute on chronic CHF: on milrinone, dopamine.  6. CAD s/p 3v CABG on 68/12: complicated by periop MI, drips per above.  7. Afib: pacemaker, MAZE procedure 10/15  8. Transaminitis: hepatic congestion, cardiogenic shock. 9. Pneumothorax: s/p chest tube 10/22  Renal Attending: We have been successful at removing fluid.  Will reduce UF rate and be careful about removing too much.  Metabolic paremters are stable. Terrisha Lopata C  _________________________________________________________________ S: "I feel a little better"  O:BP 98/76  Pulse 106  Temp(Src) 97.7 F (36.5 C) (Oral)  Resp 22  Ht 5\' 11"  (1.803 m)  Wt 217 lb 6 oz (98.6 kg)  BMI 30.33 kg/m2  SpO2 100%  Intake/Output Summary (Last 24 hours) at 02/05/14 0754 Last data filed at 02/05/14 0700  Gross per 24 hour  Intake 2502.8 ml  Output   5552 ml  Net -3049.2 ml   Intake/Output: I/O last 3 completed shifts: In: 3231.4 [P.O.:90; I.V.:1826.4; NG/GT:515; IV Piggyback:800] Out: 7517 [Urine:385; Other:7110; Stool:1; Chest Tube:150]  Intake/Output this shift:    Weight change: -14 lb 15.9 oz (-6.8 kg) General: NAD  HEENT: Right IJ cath in place  CV: RRR. Well healed sternotomy scar.  Resp: clear anteriorly,  normal effort  Abd: soft, obese, +BS  Ext: 2+ edema LE and 2+ edema UE.  Neuro: sleeping, awakes only briefly.   Recent Labs Lab 02/01/14 0335  02/02/14 0430  02/03/14 0400  02/03/14 1300  02/03/14 2100 02/03/14 2201 02/03/14 2223 02/04/14 0429 02/04/14 1300 02/04/14 1552 02/04/14 1647 02/05/14 0419  NA 131*  < > 130*  < > 139  < > 139  < > 139 137 138 140 137 QUESTIONABLE RESULTS, RECOMMEND RECOLLECT TO VERIFY 140 136*  K 3.5*  < > 3.2*  < > 3.2*  < > 3.6*  < > 3.8 3.4* 3.5* 4.1 4.1 QUESTIONABLE RESULTS, RECOMMEND RECOLLECT TO VERIFY 4.3 4.0  CL 93*  --  88*  < > 87*  < > 85*  < > 87* 83* 80* 91* 92* QUESTIONABLE RESULTS, RECOMMEND RECOLLECT TO VERIFY 97 94*  CO2 20  --  24  < > 38*  --  42*  --  43*  --   --  38* 34* QUESTIONABLE RESULTS, RECOMMEND RECOLLECT TO VERIFY 33* 29  GLUCOSE 76  --  159*  < > 174*  < > 182*  < > 134* 139* 164* 130* 145* QUESTIONABLE RESULTS, RECOMMEND RECOLLECT TO VERIFY 87 137*  BUN 55*  --  51*  < > 31*  < > 26*  < > 24* 23 20 23 22  QUESTIONABLE RESULTS, RECOMMEND RECOLLECT TO VERIFY 23 29*  CREATININE 4.04*  --  3.41*  < > 2.23*  < > 1.96*  < > 1.83* 1.80* 1.60* 1.81* 1.69* QUESTIONABLE RESULTS, RECOMMEND  RECOLLECT TO VERIFY 1.68* 1.81*  ALBUMIN 3.1*  --  3.0*  3.0*  < > 2.9*  --  3.0*  --  3.0*  --   --  3.0* 3.1* QUESTIONABLE RESULTS, RECOMMEND RECOLLECT TO VERIFY 3.1* 2.9*  CALCIUM 8.0*  --  9.1  < > 10.0  --  10.2  --  9.9  --   --  9.4 9.0 QUESTIONABLE RESULTS, RECOMMEND RECOLLECT TO VERIFY 8.7 8.7  PHOS  --   --  4.5  < > 2.6  --  2.3  --  2.6  --   --  2.8 2.9 QUESTIONABLE RESULTS, RECOMMEND RECOLLECT TO VERIFY 2.9  --   AST 182*  --  107*  --   --   --   --   --   --   --   --   --   --   --   --  58*  ALT 495*  --  368*  --   --   --   --   --   --   --   --   --   --   --   --  154*  < > = values in this interval not displayed. Liver Function Tests:  Recent Labs Lab 02/01/14 0335 02/02/14 0430  02/04/14 1552 02/04/14 1647  02/05/14 0419  AST 182* 107*  --   --   --  58*  ALT 495* 368*  --   --   --  154*  ALKPHOS 68 75  --   --   --  88  BILITOT 1.0 1.3*  --   --   --  2.5*  PROT 5.7* 5.9*  --   --   --  6.1  ALBUMIN 3.1* 3.0*  3.0*  < > QUESTIONABLE RESULTS, RECOMMEND RECOLLECT TO VERIFY 3.1* 2.9*  < > = values in this interval not displayed. No results found for this basename: LIPASE, AMYLASE,  in the last 168 hours No results found for this basename: AMMONIA,  in the last 168 hours CBC:  Recent Labs Lab 02/01/14 0335  02/02/14 0430  02/03/14 0400  02/03/14 2223 02/04/14 0429 02/05/14 0419  WBC 17.4*  --  16.5*  --  17.3*  --   --  17.5* 21.0*  HGB 8.1*  < > 8.2*  < > 9.3*  < > 11.2* 8.6* 8.6*  HCT 23.2*  < > 23.8*  < > 29.4*  < > 33.0* 27.4* 27.7*  MCV 87.5  --  89.5  --  91.9  --   --  95.1 96.2  PLT 118*  --  178  --  233  --   --  275 338  < > = values in this interval not displayed. Cardiac Enzymes:  Recent Labs Lab 01/29/14 1545  CKTOTAL 347*  CKMB 20.1*  TROPONINI >20.00*   CBG:  Recent Labs Lab 02/03/14 1214 02/03/14 1543 02/04/14 0807 02/04/14 1227 02/04/14 1558  GLUCAP 152* 130* 107* 139* 72    Iron Studies: No results found for this basename: IRON, TIBC, TRANSFERRIN, FERRITIN,  in the last 72 hours Studies/Results: Dg Chest Port 1 View  02/04/2014   CLINICAL DATA:  Status post CABG 8 days ago  EXAM: PORTABLE CHEST - 1 VIEW  COMPARISON:  Portable chest x-ray of February 02, 2014  FINDINGS: The lungs are reasonably well inflated. No pneumothorax is evident. The right basilar chest tube is unchanged in position. There is no  significant pleural effusion. The pulmonary interstitial markings are slightly more prominent today bilaterally. The cardiopericardial silhouette remains enlarged and the pulmonary vascularity is engorged. The right internal jugular venous catheter tip overlies the proximal SVC. The right subclavian venous catheter tip projects over the right cavoatrial  junction. The permanent pacemaker is unchanged in appearance.  IMPRESSION: 1. There is no pneumothorax or significant pleural effusion. The right-sided chest tube is unchanged projecting over the right hemidiaphragm. 2. Increased interstitial density bilaterally is consistent with slight worsening of interstitial edema.   Electronically Signed   By: David  Martinique   On: 02/04/2014 08:03   Dg Abd Portable 1v  02/04/2014   CLINICAL DATA:  Status post feeding tube placement  EXAM: PORTABLE ABDOMEN - 1 VIEW  COMPARISON:  None.  FINDINGS: The radiodense tipped feeding tube tip projects in the proximal duodenum. The bowel gas pattern is nonspecific. There is a right lower chest tube.  IMPRESSION: The feeding tube tip position overlies the proximal duodenum.   Electronically Signed   By: David  Martinique   On: 02/04/2014 13:55   . antiseptic oral rinse  7 mL Mouth Rinse BID  . aspirin EC  325 mg Oral Daily   Or  . aspirin  324 mg Per Tube Daily  . atorvastatin  40 mg Oral q1800  . bisacodyl  10 mg Oral Daily   Or  . bisacodyl  10 mg Rectal Daily  . docusate sodium  200 mg Oral Daily  . feeding supplement (PRO-STAT SUGAR FREE 64)  30 mL Per Tube TID  . fluconazole (DIFLUCAN) IV  800 mg Intravenous Q24H  . insulin aspart  0-9 Units Subcutaneous TID WC  . metoCLOPramide  5 mg Oral TID AC  . nystatin  5 mL Oral QID  . pantoprazole  40 mg Oral Daily  . piperacillin-tazobactam  3.375 g Intravenous 4 times per day  . sodium chloride  10-40 mL Intracatheter Q12H  . vancomycin  1,000 mg Intravenous Q24H    BMET    Component Value Date/Time   NA 136* 02/05/2014 0419   K 4.0 02/05/2014 0419   CL 94* 02/05/2014 0419   CO2 29 02/05/2014 0419   GLUCOSE 137* 02/05/2014 0419   BUN 29* 02/05/2014 0419   CREATININE 1.81* 02/05/2014 0419   CALCIUM 8.7 02/05/2014 0419   GFRNONAA 33* 02/05/2014 0419   GFRAA 39* 02/05/2014 0419   CBC    Component Value Date/Time   WBC 21.0* 02/05/2014 0419   RBC 2.88*  02/05/2014 0419   HGB 8.6* 02/05/2014 0419   HCT 27.7* 02/05/2014 0419   PLT 338 02/05/2014 0419   MCV 96.2 02/05/2014 0419   MCH 29.9 02/05/2014 0419   MCHC 31.0 02/05/2014 0419   RDW 19.3* 02/05/2014 0419   LYMPHSABS 1.4 01/06/2014 1305   MONOABS 1.1* 01/06/2014 1305   EOSABS 0.1 01/06/2014 1305   BASOSABS 0.0 01/06/2014 1305    Tawanna Sat 7:54 AM

## 2014-02-05 NOTE — Evaluation (Signed)
Clinical/Bedside Swallow Evaluation Patient Details  Name: Francisco Merritt MRN: [id number] Date of Birth: December 15, 1929  Today's Date: 02/05/2014 Time: 0930-1005 SLP Time Calculation (min): 35 min  Past Medical History:  Past Medical History  Diagnosis Date  . Paroxysmal atrial fibrillation     s/p [employer] Insignia pacemaker; started sotalol 06/2009  . Tachy-brady syndrome   . Coronary artery disease     one vessel CAD with normal EF; Cath 12/2006. Ef normal. LM nl, LAD 40%, D2 occulded with collaterals, LCX 40%, RCA 30%; Echo 02/2009 EF 03-70% grade I diastolic dysfunction.  Marland Kitchen History of gallstones     status post cholecystectomy  . Hypertension   . Hyperlipidemia   . History of peptic ulcer disease   . Benign prostatic hypertrophy   . Obesity   . Diverticulosis of colon   . Testosterone deficiency   . ED (erectile dysfunction)   . Atrial fibrillation   . Nephrolithiasis     "passed it"  . Osteoarthritis     "legs and lower back" (02/07/2014)  . Chronic lower back pain    Past Surgical History:  Past Surgical History  Procedure Laterality Date  . Insert / replace / remove pacemaker  2008  . Colonoscopy  1999  . Upper gastroduodenoscopy  1999  . Doppler evaluation    . Cardiac catheterization  ~ 2008; 01/17/2014    ; "unable to put stents in"  . Cholecystectomy  ~ 2008  . Cataract extraction, bilateral Bilateral   . Coronary artery bypass graft N/A 02/02/2014    Procedure: CORONARY ARTERY BYPASS GRAFTING (CABG), ON PUMP, TIMES THREE, USING LEFT INTERNAL MAMMARY ARTERY, RIGHT GREATER SAPHENOUS VEIN HARVESTED ENDOSCOPICALLY.;  Surgeon: Melrose Nakayama, MD;  Location: [facility];  Service: Open Heart Surgery;  Laterality: N/A;  . Maze N/A 01/17/2014    Procedure: MAZE;  Surgeon: Melrose Nakayama, MD;  Location: [facility];  Service: Open Heart Surgery;  Laterality: N/A;  . Intraoperative transesophageal echocardiogram N/A 01/22/2014    Procedure: INTRAOPERATIVE TRANSESOPHAGEAL  ECHOCARDIOGRAM;  Surgeon: Melrose Nakayama, MD;  Location: [facility];  Service: Open Heart Surgery;  Laterality: N/A;   HPI:  80 year old male admitted 01/18/2014. Pt underwent CABG with perioperative MI and acute renal failure. Pt on CRRT. BSE ordered to evaluate readiness to begin po intake. Pt currently has panda feeding tube   Assessment / Plan / Recommendation Clinical Impression  Oral care completed with suction. Pt grimaced throughoug oral care despite gentle technique. Pt accepted ice chip x2. No overt s/s aspiration, however, pt exhibited significant grimacing with swallow. RN indicated pt being treated for thrush. Pt does demonstrate discolored patches on the lingual surface. At this time, recommend continuing NPO with TF for nutrition and hydration. Following posted precautions, ice chips are allowed per pt request. Oral pain currently prohibitive to po intake. SLP will continue to follow for po readiness, and proceed accordingly. RN and family present and aware of recommendations.    Aspiration Risk  Mild    Diet Recommendation NPO (ice chips per pt request)   Medication Administration: Via alternative means    Other  Recommendations Oral Care Recommendations: Oral care Q4 per protocol;Staff/trained caregiver to provide oral care Other Recommendations: Have oral suction available   Follow Up Recommendations   (TBD)    Frequency and Duration min 2x/week  2 weeks   Pertinent Vitals/Pain VSS, facial grimacing with oral care, swallowing. RN aware.    SLP Swallow Goals  po readiness  Swallow Study Prior Functional Status   Pt has upper and lower dentures. No history of swallowing difficulty per family. Pt tolerated regular diet with thin liquids prior to admit.    General Date of Onset: 02/02/2014 HPI: 80 year old male admitted 02/03/2014. Pt underwent CABG with perioperative MI and acute renal failure. Pt on CRRT. BSE ordered to evaluate readiness to begin po intake. Pt currently  has panda feeding tube Type of Study: Bedside swallow evaluation Previous Swallow Assessment: none Diet Prior to this Study: NPO;Panda Temperature Spikes Noted: No Respiratory Status: Nasal cannula History of Recent Intubation: Yes Length of Intubations (days): 1 days Date extubated: 01/28/14 Behavior/Cognition: Alert;Cooperative;Pleasant mood;Requires cueing Oral Cavity - Dentition: Edentulous (dentures present, not placed) Self-Feeding Abilities: Total assist Patient Positioning: Upright in bed Baseline Vocal Quality: Hoarse;Low vocal intensity Volitional Cough: Weak Volitional Swallow: Able to elicit    Oral/Motor/Sensory Function Overall Oral Motor/Sensory Function:  (Pt reports significant oropharyngeal pain, likely due to thrush. Function appears adequate)   Ice Chips Ice chips: Within functional limits Presentation: Spoon   Thin Liquid Thin Liquid: Not tested    Nectar Thick Nectar Thick Liquid: Not tested   Honey Thick Honey Thick Liquid: Not tested   Puree Puree: Not tested   Solid   GO    Solid: Not tested      Radwan Cowley B. [place], [place], [place]  Shonna Chock 02/05/2014,10:14 AM

## 2014-02-06 ENCOUNTER — Inpatient Hospital Stay (HOSPITAL_COMMUNITY): Payer: Medicare HMO

## 2014-02-06 ENCOUNTER — Encounter (HOSPITAL_COMMUNITY): Payer: Self-pay | Admitting: Radiology

## 2014-02-06 DIAGNOSIS — J9601 Acute respiratory failure with hypoxia: Secondary | ICD-10-CM

## 2014-02-06 DIAGNOSIS — J189 Pneumonia, unspecified organism: Secondary | ICD-10-CM

## 2014-02-06 LAB — POCT I-STAT 3, ART BLOOD GAS (G3+)
Acid-Base Excess: 3 mmol/L — ABNORMAL HIGH (ref 0.0–2.0)
Acid-Base Excess: 4 mmol/L — ABNORMAL HIGH (ref 0.0–2.0)
Acid-Base Excess: 2 mmol/L (ref 0.0–2.0)
Bicarbonate: 27.2 mEq/L — ABNORMAL HIGH (ref 20.0–24.0)
Bicarbonate: 28.2 mEq/L — ABNORMAL HIGH (ref 20.0–24.0)
Bicarbonate: 25.6 mEq/L — ABNORMAL HIGH (ref 20.0–24.0)
O2 Saturation: 93 %
O2 Saturation: 93 %
O2 Saturation: 93 %
PCO2 ART: 38 mmHg (ref 35.0–45.0)
PH ART: 7.478 — AB (ref 7.350–7.450)
PH ART: 7.471 — AB (ref 7.350–7.450)
PO2 ART: 62 mmHg — AB (ref 80.0–100.0)
Patient temperature: 98.6
Patient temperature: 98.7
Patient temperature: 98.1
TCO2: 28 mmol/L (ref 0–100)
TCO2: 29 mmol/L (ref 0–100)
TCO2: 27 mmol/L (ref 0–100)
pCO2 arterial: 37.7 mmHg (ref 35.0–45.0)
pCO2 arterial: 35.1 mmHg (ref 35.0–45.0)
pH, Arterial: 7.466 — ABNORMAL HIGH (ref 7.350–7.450)
pO2, Arterial: 64 mmHg — ABNORMAL LOW (ref 80.0–100.0)
pO2, Arterial: 60 mmHg — ABNORMAL LOW (ref 80.0–100.0)

## 2014-02-06 LAB — RENAL FUNCTION PANEL
Albumin: 3.2 g/dL — ABNORMAL LOW (ref 3.5–5.2)
Anion gap: 16 — ABNORMAL HIGH (ref 5–15)
BUN: 34 mg/dL — AB (ref 6–23)
CALCIUM: 8.4 mg/dL (ref 8.4–10.5)
CO2: 25 meq/L (ref 19–32)
Chloride: 96 mEq/L (ref 96–112)
Creatinine, Ser: 1.99 mg/dL — ABNORMAL HIGH (ref 0.50–1.35)
GFR calc non Af Amer: 30 mL/min — ABNORMAL LOW (ref >90)
GFR, EST AFRICAN AMERICAN: 35 mL/min — AB (ref >90)
Glucose, Bld: 133 mg/dL — ABNORMAL HIGH (ref 70–99)
Phosphorus: 2.3 mg/dL (ref 2.3–4.6)
Potassium: 4.2 mEq/L (ref 3.7–5.3)
SODIUM: 137 meq/L (ref 137–147)

## 2014-02-06 LAB — CARBOXYHEMOGLOBIN
CARBOXYHEMOGLOBIN: 1.8 % — AB (ref 0.5–1.5)
CARBOXYHEMOGLOBIN: 1.7 % — AB (ref 0.5–1.5)
METHEMOGLOBIN: 1.4 % (ref 0.0–1.5)
METHEMOGLOBIN: 1.2 % (ref 0.0–1.5)
O2 SAT: 34 %
O2 Saturation: 33.6 %
TOTAL HEMOGLOBIN: 11.1 g/dL — AB (ref 13.5–18.0)
Total hemoglobin: 8.4 g/dL — ABNORMAL LOW (ref 13.5–18.0)

## 2014-02-06 LAB — MAGNESIUM: Magnesium: 2.4 mg/dL (ref 1.5–2.5)

## 2014-02-06 LAB — CBC
HCT: 26.3 % — ABNORMAL LOW (ref 39.0–52.0)
HEMOGLOBIN: 8.3 g/dL — AB (ref 13.0–17.0)
MCH: 30.6 pg (ref 26.0–34.0)
MCHC: 31.6 g/dL (ref 30.0–36.0)
MCV: 97 fL (ref 78.0–100.0)
PLATELETS: 384 10*3/uL (ref 150–400)
RBC: 2.71 MIL/uL — AB (ref 4.22–5.81)
RDW: 19.6 % — ABNORMAL HIGH (ref 11.5–15.5)
WBC: 35.8 10*3/uL — ABNORMAL HIGH (ref 4.0–10.5)

## 2014-02-06 LAB — GLUCOSE, CAPILLARY
GLUCOSE-CAPILLARY: 141 mg/dL — AB (ref 70–99)
Glucose-Capillary: 142 mg/dL — ABNORMAL HIGH (ref 70–99)
Glucose-Capillary: 102 mg/dL — ABNORMAL HIGH (ref 70–99)

## 2014-02-06 LAB — PRO B NATRIURETIC PEPTIDE: Pro B Natriuretic peptide (BNP): 5061 pg/mL — ABNORMAL HIGH (ref 0–450)

## 2014-02-06 LAB — HEPARIN LEVEL (UNFRACTIONATED): Heparin Unfractionated: 0.47 IU/mL (ref 0.30–0.70)

## 2014-02-06 MED ORDER — DEXTROSE 5 % IV SOLN
10.0000 mg/h | INTRAVENOUS | Status: DC
  Administered 2014-02-06: 10 mg/h via INTRAVENOUS
  Filled 2014-02-06: qty 10

## 2014-02-06 MED ORDER — IOHEXOL 300 MG/ML  SOLN
25.0000 mL | INTRAMUSCULAR | Status: AC
  Administered 2014-02-06 (×2): 25 mL via ORAL

## 2014-02-06 MED ORDER — FENTANYL CITRATE 0.05 MG/ML IJ SOLN
25.0000 ug | INTRAMUSCULAR | Status: DC | PRN
  Administered 2014-02-06: 50 ug via INTRAVENOUS
  Filled 2014-02-06: qty 2

## 2014-02-06 MED ORDER — MIDAZOLAM BOLUS VIA INFUSION
2.0000 mg | INTRAVENOUS | Status: DC | PRN
  Filled 2014-02-06: qty 4

## 2014-02-06 MED ORDER — SODIUM CHLORIDE 0.9 % IV SOLN
2.0000 mg/h | INTRAVENOUS | Status: DC
  Administered 2014-02-06: 2 mg/h via INTRAVENOUS
  Filled 2014-02-06: qty 10

## 2014-02-07 ENCOUNTER — Encounter (HOSPITAL_COMMUNITY): Payer: Commercial Managed Care - HMO

## 2014-02-10 LAB — CULTURE, BLOOD (ROUTINE X 2)
Culture: NO GROWTH
Culture: NO GROWTH

## 2014-02-13 NOTE — Progress Notes (Signed)
Pt expired at 1702 under the domain of comfort care. Md Bensimhon present at bedside during patients passing and pronounced the patients death.  CDS called at 1709 (ref # [id number]) and pt is not a candidate for organ donation. Family present during passing and emotional support offered to them.   Pt's dentures sent home with his daughter, Santiago Glad. No other belongings at bedside.   Post-mortem care completed and checklist complete.   Lorre Munroe

## 2014-02-13 NOTE — Progress Notes (Signed)
Comfort Care initiated per MD order

## 2014-02-13 NOTE — Progress Notes (Addendum)
Pt noted to be more lethargic this AM upon assessment, very fidgety, and also having an acute change in his respiratory assessment. His breathing is labored, lung sounds are wet to auscultation, and he is noted to have expiratory wheezing. Pt is on a 55% venutri mask at present and is c/o being SOB. MD Roxy Manns at bedside and made aware  Lorre Munroe

## 2014-02-13 NOTE — Progress Notes (Signed)
ANTICOAGULATION CONSULT NOTE - Follow Up Consult  Pharmacy Consult for Heparin Indication: atrial fibrillation  Allergies  Allergen Reactions  . Sulfonamide Derivatives     REACTION: n/v    Patient Measurements: Height: 5\' 11"  (180.3 cm) Weight: 216 lb 11.4 oz (98.3 kg) IBW/kg (Calculated) : 75.3 Heparin Dosing Weight: 97.5 kg  Vital Signs: Temp: 98.1 F (36.7 C) (10/25 0800) Temp Source: Axillary (10/25 0800) BP: 77/43 mmHg (10/25 1000) Pulse Rate: 129 (10/25 1000)  Labs:  Recent Labs  02/04/14 0429  02/05/14 0419  02/05/14 1150 02/05/14 1900 02/05/14 2012 02-27-2014 0430  HGB 8.6*  --  8.6*  --   --   --   --  8.3*  HCT 27.4*  --  27.7*  --   --   --   --  26.3*  PLT 275  --  338  --   --   --   --  384  HEPARINUNFRC  --   < >  --   < > 0.22* 0.52  --  0.47  CREATININE 1.81*  < > 1.81*  --  1.50*  --  1.86* 1.99*  < > = values in this interval not displayed.  Estimated Creatinine Clearance: 34.8 ml/min (by C-G formula based on Cr of 1.99).   Medications:  Infusions:  . amiodarone 30 mg/hr (27-Feb-2014 0800)  . DOPamine 3 mcg/kg/min (02-27-14 0800)  . feeding supplement (OSMOLITE 1.5 CAL) 1,000 mL (27-Feb-2014 0222)  . heparin 1,200 Units/hr (February 27, 2014 0916)  . milrinone 0.375 mcg/kg/min (02-27-2014 0902)  . norepinephrine (LEVOPHED) Adult infusion 8 mcg/min (02/27/2014 0800)  . dialysis replacement fluid (prismasate) 250 mL/hr at 02/05/14 1212  . dialysis replacement fluid (prismasate) 200 mL/hr at 02/05/14 1212  . dialysate (PRISMASATE) 500 mL/hr at 2014/02/27 8032    Assessment: 80 year old male on IV heparin for atrial fibrillation. Heparin level is therapeutic on 1200 units/hr Hg 8.3, no bleeding noted   Patient is on CVVHD.  Goal of Therapy:  Heparin level 0.3-0.7 units/ml Monitor platelets by anticoagulation protocol: Yes   Plan:  - Continue heparin at current rate - Daily heparin level and CBC while on therapy.  Thanks for allowing pharmacy to be a part  of this patient's care.  Excell Seltzer, PharmD Clinical Pharmacist, [phone] Feb 27, 2014,10:43 AM

## 2014-02-13 NOTE — Progress Notes (Addendum)
[address]       [place], [id number]             [phone]        CARDIOTHORACIC SURGERY PROGRESS NOTE   R10 Days Post-Op Procedure(s) (LRB): CORONARY ARTERY BYPASS GRAFTING (CABG), ON PUMP, TIMES THREE, USING LEFT INTERNAL MAMMARY ARTERY, RIGHT GREATER SAPHENOUS VEIN HARVESTED ENDOSCOPICALLY. (N/A) MAZE (N/A) INTRAOPERATIVE TRANSESOPHAGEAL ECHOCARDIOGRAM (N/A)  Subjective: Feels poorly but nothing specific.  Extremely weak.  Denies pain in chest or abdomen.  Some pain in throat and right groin.  Objective: Vital signs: BP Readings from Last 1 Encounters:  02-25-14 98/72   Pulse Readings from Last 1 Encounters:  Feb 25, 2014 119   Resp Readings from Last 1 Encounters:  02/25/14 21   Temp Readings from Last 1 Encounters:  02/25/14 98.1 F (36.7 C) Axillary    Hemodynamics: CVP:  [10 mmHg-14 mmHg] 14 mmHg  Mixed venous co-ox 33.6%  Physical Exam:  Rhythm:   Afib w/ HR 110-130  Breath sounds: Diminished at bases  Heart sounds:  Irregular rate and rhythm  Incisions:  Clean and dry, sternum stable, non-tender  Abdomen:  Soft, non-distended, non-tender, + BS's, tolerating low volume tube feeds w/ no residuals  Extremities:  Warm, adequately perfused   Intake/Output from previous day: 10/24 0701 - 10/25 0700 In: 3514.5 [I.V.:1174.5; NG/GT:1290; IV Piggyback:1050] Out: 7616 [Urine:15; Chest Tube:150] Intake/Output this shift: Total I/O In: 226 [I.V.:106; NG/GT:120] Out: 392 [Other:317; Chest Tube:75]  Lab Results:  CBC: Recent Labs  02/05/14 0419 02/25/14 0430  WBC 21.0* 35.8*  HGB 8.6* 8.3*  HCT 27.7* 26.3*  PLT 338 384    BMET:  Recent Labs  02/05/14 2012 February 25, 2014 0430  NA 136* 137  K 4.0 4.2  CL 96 96  CO2 28 25  GLUCOSE 128* 133*  BUN 33* 34*  CREATININE 1.86* 1.99*  CALCIUM 8.4 8.4     CBG (last 3)   Recent Labs  02/05/14 1122 02/05/14 1532 February 25, 2014 0831  GLUCAP 139* 142* 141*    ABG    Component Value  Date/Time   PHART 7.471* 2014-02-25 0820   PCO2ART 35.1 02-25-14 0820   PO2ART 60.0* 02-25-14 0820   HCO3 25.6* 02/25/2014 0820   TCO2 27 02/25/2014 0820   ACIDBASEDEF 1.0 02/02/2014 0013   O2SAT 93.0 02/25/14 0820    CXR: PORTABLE CHEST - 1 VIEW  COMPARISON: Chest x-ray 02/05/2014.  FINDINGS:  There is a right-sided internal jugular central venous catheter with  tip terminating in the superior cavoatrial junction. Small bore  feeding tube extends into the stomach, but the tip of the tube  extends below the lower margin of the image. Right-sided chest tube  with tip and side port projecting over the lower right hemithorax.  Status post median sternotomy for CABG and left atrial appendage  ligation, with a ligation clip noted. Left-sided pacemaker device in  position with lead tips projecting over the expected location of the  right atrium and right ventricular apex. The continues to be  multiple lucencies projecting over the mediastinum, concerning for  pneumomediastinum. There is also lucency in the medial aspect of the  left apex, likely to represent a residual trace left pneumothorax  (less than 5% of the volume of the left hemithorax). Unusual lucency  at the right lung base adjacent to chest tube may reflect the  presence of a partially loculated subpulmonic pneumothorax.  Extensive interstitial and airspace opacities throughout the mid  to  lower lungs bilaterally. Probable small bilateral pleural effusions.  Moderate enlargement of the cardiopericardial silhouette. Upper  mediastinal contours remain distorted.  IMPRESSION:  1. Postoperative changes and support apparatus, as above.  2. Small volume of pneumomediastinum, trace at left apical  pneumothorax, and probable small right subpulmonic pneumothorax.  3. Significantly worsening aeration throughout the mid to lower  lungs bilaterally concerning for worsening multilobar pneumonia,  although there is likely some  superimposed bibasilar atelectasis.  Probable small bilateral pleural effusions.  Electronically Signed  By: Vinnie Langton M.D.  On: 07-Feb-2014 08:17   Assessment/Plan: S/P Procedure(s) (LRB): CORONARY ARTERY BYPASS GRAFTING (CABG), ON PUMP, TIMES THREE, USING LEFT INTERNAL MAMMARY ARTERY, RIGHT GREATER SAPHENOUS VEIN HARVESTED ENDOSCOPICALLY. (N/A) MAZE (N/A) INTRAOPERATIVE TRANSESOPHAGEAL ECHOCARDIOGRAM (N/A)  CV:  Remains in Afib w/ increased HR, BP adequate but now on levophed @8  milrinone @ 0.375 and dopamine @ 3, co-ox down to 36% w/ acute combined systolic and diastolic CHF, likely ongoing cardiogenic + septic shock, CVP stable/decreased - I think he looks septic  I favor backing off of volume removal to keep CVP up somewhat  Titrate levophed as needed  Continue milrinone and low dose dopamine for now  Continue IV amiodarone  Continue IV heparin  RESP:  oxygenation adequate although slightly worse, CXR w/ slight increase opacity, ABG stable - remains tenuous but stable off vent  Monitor closely  Will ask Pulm/CCM team to assist  ID: WBC continues to rise w/out clear source, now 36k.  Now day #2 empiric Vanc/Zosyn/Diflucan.  Blood cultures no growth so far.  Urine culture negative.  Old Foley removed yesterday. Patient isn't coughing much but CXR now does have slight increased opacity. Mediastinal air noted on CXR but sternal incision looks good and no signs of instability.  Abdominal exam benign and some signs of bowel function. Central lines placed 10/18 and 10/20.  ? HCAP  Will get CT scan C/A/P w/out IV contrast to look for potential occult source of sepsis.  Will make certain to give oral water-soluble contrast to r/o esophageal perforation given his h/o TEE and throat pain  RENAL:  Remains in oliguric renal failure, creatinine trending up.  Tolerating CVVHD.  Electrolytes stable.  I/O's negative 0.5 liter yesterday.  Weight trending down.  I favor backing off of  volume removal given fall in co-ox and relatively low CVP  HEME:  Expected post op acute blood loss anemia, stable.  WBC rising.  Platelet count normal.  FEN:  Severely protein/calorie depletion/malnutrition, poor po intake, tolerating tube feeds so far  NEURO:  Stable  PNEUMOTHORAX:  Resolved after chest tube placement.  Leave tube in place for now  DISP:  Patient remains critically ill.  Prognosis poor.    OWEN,CLARENCE H 02/07/2014 9:41 AM

## 2014-02-13 NOTE — Progress Notes (Signed)
Patient ID: Francisco Merritt, male    DOB: February 08, 1930, 80 y.o.   MRN: [id number] Nephrology Progress Note  Assessment/Plan: 80 y.o. male with PMH CKD, CAD, [name], CHF, HTN; underwent 3v CABG and MAZE on 27/06 complicated by postop MI requiring pressor and inotrope support. Inadequate diuresis and fluid overload, now receiving CVVHD.  1. Acute on CKD and fluid overload: Postop MI and cardiogenic shock. Cont CRRT. Net negative -8.4L, wt down ~30lbs. Will keep fluid neutral, UF goal to 0cc/hr. UOP minimal. Scr slowly trending up, now 2. 2. Vascular access: right IJ.  3. Hypokalemia: resolved currently. 4. Metabolic alkalosis: resolved 5. ID: WBC trending up now 36k, on vanc, zosyn, diflucan. Per primary. 6. Acute on chronic CHF: on milrinone, dopamine. Levophed restarted. 7. CAD s/p 3v CABG on 23/76: complicated by periop MI, drips per above.  8. Afib: pacemaker, MAZE procedure 10/15  9. Transaminitis: hepatic congestion, cardiogenic shock. 10. Pneumothorax: s/p chest tube 10/22  Renal Attending: Difficult evening with increased WOB, tolerating dialysis with weight loss and fluid removal.  Clinically looks worse and fatigued.  I have spoken with Dr. Ricard Dillon.  Will keep pt even for now.  WBC has risen and levophed started. Francisco Merritt  _________________________________________________________________ S: Asks if it is raining outside. States he is not having difficulty breathing  O:BP 88/59  Pulse 27  Temp(Src) 98.6 F (37 Merritt) (Axillary)  Resp 20  Ht 5\' 11"  (1.803 m)  Wt 216 lb 11.4 oz (98.3 kg)  BMI 30.24 kg/m2  SpO2 80%  Intake/Output Summary (Last 24 hours) at 02-26-14 0806 Last data filed at 02/26/2014 0700  Gross per 24 hour  Intake 3381.1 ml  Output   3866 ml  Net -484.9 ml   Intake/Output: I/O last 3 completed shifts: In: 4787.5 [I.V.:1842.5; NG/GT:1745; IV Piggyback:1200] Out: 6400 [Urine:90; EGBTD:1761; Stool:1; Chest Tube:190]  Intake/Output this shift:    Weight  change: -10.6 oz (-0.3 kg) General: mild resp distress HEENT: Right IJ cath in place  CV: RRR. Well healed sternotomy scar.  Resp: adventitious sounds right side, left side clear anteriorly. NRB in place, appears to be having some difficulty with breathing Abd: soft, obese, +BS  Ext: 2+ edema LE and 2+ edema UE.  Neuro: sleeping, awakes only briefly.   Recent Labs Lab 02/01/14 0335  02/02/14 0430  02/04/14 0429 02/04/14 1300 02/04/14 1552 02/04/14 1647 02/05/14 0419 02/05/14 1150 02/05/14 2012 February 26, 2014 0430  NA 131*  < > 130*  < > 140 137 QUESTIONABLE RESULTS, RECOMMEND RECOLLECT TO VERIFY 140 136* 142 136* 137  K 3.5*  < > 3.2*  < > 4.1 4.1 QUESTIONABLE RESULTS, RECOMMEND RECOLLECT TO VERIFY 4.3 4.0 3.3* 4.0 4.2  CL 93*  --  88*  < > 91* 92* QUESTIONABLE RESULTS, RECOMMEND RECOLLECT TO VERIFY 97 94* 106 96 96  CO2 20  --  24  < > 38* 34* QUESTIONABLE RESULTS, RECOMMEND RECOLLECT TO VERIFY 33* 29 23 28 25   GLUCOSE 76  --  159*  < > 130* 145* QUESTIONABLE RESULTS, RECOMMEND RECOLLECT TO VERIFY 87 137* 119* 128* 133*  BUN 55*  --  51*  < > 23 22 QUESTIONABLE RESULTS, RECOMMEND RECOLLECT TO VERIFY 23 29* 27* 33* 34*  CREATININE 4.04*  --  3.41*  < > 1.81* 1.69* QUESTIONABLE RESULTS, RECOMMEND RECOLLECT TO VERIFY 1.68* 1.81* 1.50* 1.86* 1.99*  ALBUMIN 3.1*  --  3.0*  3.0*  < > 3.0* 3.1* QUESTIONABLE RESULTS, RECOMMEND RECOLLECT TO VERIFY 3.1* 2.9* 2.2* 3.1*  3.2*  CALCIUM 8.0*  --  9.1  < > 9.4 9.0 QUESTIONABLE RESULTS, RECOMMEND RECOLLECT TO VERIFY 8.7 8.7 6.5* 8.4 8.4  PHOS  --   --  4.5  < > 2.8 2.9 QUESTIONABLE RESULTS, RECOMMEND RECOLLECT TO VERIFY 2.9  --  2.0* 2.0* 2.3  AST 182*  --  107*  --   --   --   --   --  58*  --   --   --   ALT 495*  --  368*  --   --   --   --   --  154*  --   --   --   < > = values in this interval not displayed. Liver Function Tests:  Recent Labs Lab 02/01/14 0335 02/02/14 0430  02/05/14 0419 02/05/14 1150 02/05/14 2012 March 07, 2014 0430  AST  182* 107*  --  58*  --   --   --   ALT 495* 368*  --  154*  --   --   --   ALKPHOS 68 75  --  88  --   --   --   BILITOT 1.0 1.3*  --  2.5*  --   --   --   PROT 5.7* 5.9*  --  6.1  --   --   --   ALBUMIN 3.1* 3.0*  3.0*  < > 2.9* 2.2* 3.1* 3.2*  < > = values in this interval not displayed. No results found for this basename: LIPASE, AMYLASE,  in the last 168 hours No results found for this basename: AMMONIA,  in the last 168 hours CBC:  Recent Labs Lab 02/02/14 0430  02/03/14 0400  02/04/14 0429 02/05/14 0419 03-07-2014 0430  WBC 16.5*  --  17.3*  --  17.5* 21.0* 35.8*  HGB 8.2*  < > 9.3*  < > 8.6* 8.6* 8.3*  HCT 23.8*  < > 29.4*  < > 27.4* 27.7* 26.3*  MCV 89.5  --  91.9  --  95.1 96.2 97.0  PLT 178  --  233  --  275 338 384  < > = values in this interval not displayed. Cardiac Enzymes: No results found for this basename: CKTOTAL, CKMB, CKMBINDEX, TROPONINI,  in the last 168 hours CBG:  Recent Labs Lab 02/04/14 0807 02/04/14 1227 02/04/14 1558 02/05/14 0802 02/05/14 1122  GLUCAP 107* 139* 72 118* 139*    Iron Studies: No results found for this basename: IRON, TIBC, TRANSFERRIN, FERRITIN,  in the last 72 hours Studies/Results: Dg Chest Port 1 View  02/05/2014   CLINICAL DATA:  Recent CABG.  Subsequent encounter.  EXAM: PORTABLE CHEST - 1 VIEW  COMPARISON:  02/04/2014  FINDINGS: A feeding tube is been placed and extends down into the stomach. Right IJ line tip: Brachiocephalic confluence. Right subclavian line tip: Right atrium. Dual lead pacer remains in place.  Right basilar chest tube noted. Possible small left pneumothorax medially at the left lung apex. Vertical gas collection projecting centrally over the chest, new from prior is, cannot exclude sternal dehiscence or pneumomediastinum. No sternal wire migration currently.  Obscuration of the left lung base by airspace opacity. Bilateral interstitial opacities are present.  Atrial clip and postoperative findings from  prior CABG noted.  IMPRESSION: 1. Suspected small left apical pneumothorax medially. 2. Linear low density projecting centrally over the sternum may represent gas in the sternal wound or pneumomediastinum. I cannot exclude sternal dehiscence, although no sternal wire migration is observed. Correlate  with clinical appearance. 3. No right-sided pneumothorax currently. A right-sided chest tube is in place. 4. Low lung volumes with bibasilar (left greater than right) atelectasis and bilateral interstitial edema. 5. Interval placement of a feeding tube.   Electronically Signed   By: Sherryl Barters M.D.   On: 02/05/2014 08:36   Dg Abd Portable 1v  02/04/2014   CLINICAL DATA:  Status post feeding tube placement  EXAM: PORTABLE ABDOMEN - 1 VIEW  COMPARISON:  None.  FINDINGS: The radiodense tipped feeding tube tip projects in the proximal duodenum. The bowel gas pattern is nonspecific. There is a right lower chest tube.  IMPRESSION: The feeding tube tip position overlies the proximal duodenum.   Electronically Signed   By: David  Martinique   On: 02/04/2014 13:55   . antiseptic oral rinse  7 mL Mouth Rinse BID  . aspirin EC  325 mg Oral Daily   Or  . aspirin  324 mg Per Tube Daily  . bisacodyl  10 mg Oral Daily   Or  . bisacodyl  10 mg Rectal Daily  . docusate  200 mg Per Tube Daily  . feeding supplement (PRO-STAT SUGAR FREE 64)  30 mL Per Tube TID  . fluconazole (DIFLUCAN) IV  800 mg Intravenous Q24H  . insulin aspart  0-9 Units Subcutaneous TID WC  . metoCLOPramide  5 mg Oral TID AC  . nystatin  5 mL Oral QID  . pantoprazole sodium  40 mg Per Tube Daily  . piperacillin-tazobactam  3.375 g Intravenous 4 times per day  . sodium chloride  10-40 mL Intracatheter Q12H  . vancomycin  1,000 mg Intravenous Q24H    BMET    Component Value Date/Time   NA 137 February 19, 2014 0430   K 4.2 02-19-14 0430   CL 96 2014/02/19 0430   CO2 25 19-Feb-2014 0430   GLUCOSE 133* 2014/02/19 0430   BUN 34* 2014-02-19 0430    CREATININE 1.99* Feb 19, 2014 0430   CALCIUM 8.4 02/19/14 0430   GFRNONAA 30* 02/19/2014 0430   GFRAA 35* 19-Feb-2014 0430   CBC    Component Value Date/Time   WBC 35.8* 2014-02-19 0430   RBC 2.71* 02-19-2014 0430   HGB 8.3* 19-Feb-2014 0430   HCT 26.3* 02/19/14 0430   PLT 384 2014/02/19 0430   MCV 97.0 02-19-14 0430   MCH 30.6 02/19/2014 0430   MCHC 31.6 02/19/14 0430   RDW 19.6* 19-Feb-2014 0430   LYMPHSABS 1.4 01/06/2014 1305   MONOABS 1.1* 01/06/2014 1305   EOSABS 0.1 01/06/2014 1305   BASOSABS 0.0 01/06/2014 1305    Tawanna Sat 8:06 AM

## 2014-02-13 NOTE — Progress Notes (Signed)
[facility] BRIEF SICU PROGRESS NOTE  10 Days Post-Op  S/P Procedure(s) (LRB): CORONARY ARTERY BYPASS GRAFTING (CABG), ON PUMP, TIMES THREE, USING LEFT INTERNAL MAMMARY ARTERY, RIGHT GREATER SAPHENOUS VEIN HARVESTED ENDOSCOPICALLY. (N/A) MAZE (N/A) INTRAOPERATIVE TRANSESOPHAGEAL ECHOCARDIOGRAM (N/A)   Results of CT scan and Dr Bensimhon's discussion with family noted. Terminal wean of support in progress. Family at bedside.  OWEN,CLARENCE H February 19, 2014 4:58 PM

## 2014-02-13 NOTE — Consult Note (Signed)
PULMONARY / CRITICAL CARE MEDICINE   Name: SHAWNTEZ DICKISON MRN: [id number] DOB: [birth year]    ADMISSION DATE:  01/27/2014 CONSULTATION DATE:  10/25  REFERRING MD :  Ricard Dillon   CHIEF COMPLAINT:  Septic shock   INITIAL PRESENTATION:  80 year old male s/p CABG 00/86, course complicated by post-op MI, on-going decompensated heart failure, acute renal failure, atrial fib and spontaneous PTX. Has been on CRRT since 10/20. Developed worsening hypotension requiring levophed on 10/24. PCCM asked to see on 10/25 for rising WBC ct, and septic shock of unclear source.   STUDIES:  10/25 CT chest/abd/pelvis: Extensive asymmetrically distributed interstitial and airspace opacities throughout the lungs bilaterally, most compatible with multilobar pneumonia.moderate to large left pleural effusion with extensive passive atelectasis in the left lower lobe. sm residual right PTX, sm amt residual pneumomediastinum. Extensive subcutaneous emphysema throughout the chest wall and  abdominal wall. Intermediate attenuation fluid collection in the right groin,  incompletely visualized, but measuring at least 3.0 x 5.1 cm,  presumably a postprocedural hematoma.   SIGNIFICANT EVENTS: 10/16 CABG X3  10/20 he was started on CVVHD given marked volume overload (> 20kg positive).  10/21: spont right PTX  10/23 he was started on empiric antibiotics for concern about rising WBC ct. CXR raising concern for infiltrates. 10/24 started on levophed   HISTORY OF PRESENT ILLNESS:   80 year old with a history of obesity, hypertension, one-vessel coronary artery disease with a totally occluded diagonal branch by catheterization. He also history of atrial fibrillation, complicated by tachybradycardia syndrome and is status post pacemaker implantation and takes coumadin. He is also on sotalol therapy. In the past he had had difficulty with beta blocker and diltiazem due to low heart rates. He was admitted on 10/15 and underwent CABG  X3 on 10/16. His course was complicated by intra-op MI, acute renal failure, and AF. He had been treated w/ amiodarone for AF and dopamine and milrinone for inotropic support.  On 10/20 he was started on CVVHD given marked volume overload (> 20kg positive). On 10/21, he had a spontaneous pneumothorax and had CT insertion for this. On 10/23 he was started on empiric antibiotics for concern about rising WBC ct. CXR raising concern for infiltrates. Levophed gtt was added on 10/24 and 10/25 PCCM asked to see re: sepsis and to help assist w/ care    PAST MEDICAL HISTORY :   has a past medical history of Paroxysmal atrial fibrillation; Tachy-brady syndrome; Coronary artery disease; History of gallstones; Hypertension; Hyperlipidemia; History of peptic ulcer disease; Benign prostatic hypertrophy; Obesity; Diverticulosis of colon; Testosterone deficiency; ED (erectile dysfunction); Atrial fibrillation; Nephrolithiasis; Osteoarthritis; and Chronic lower back pain.  has past surgical history that includes Insert / replace / remove pacemaker (2008); Colonoscopy (1999); upper gastroduodenoscopy (1999); doppler evaluation; Cardiac catheterization (~ 2008; 02/09/2014); Cholecystectomy (~ 2008); Cataract extraction, bilateral (Bilateral); Coronary artery bypass graft (N/A, 02/08/2014); MAZE (N/A, 01/29/2014); and Intraoprative transesophageal echocardiogram (N/A, 01/19/2014). Prior to Admission medications   Medication Sig Start Date End Date Taking? Authorizing Provider  acetaminophen (TYLENOL ARTHRITIS PAIN) 650 MG CR tablet Take 650 mg by mouth 2 (two) times daily.    Yes Historical Provider, MD  aspirin 81 MG tablet Take 81 mg by mouth daily.     Yes Historical Provider, MD  lisinopril (PRINIVIL,ZESTRIL) 2.5 MG tablet Take 1 tablet (2.5 mg total) by mouth daily. 01/09/14  Yes Barton Dubois, MD  metoprolol (LOPRESSOR) 50 MG tablet Take 1 tablet (50 mg total) by mouth 2 (  two) times daily. 01/09/14  Yes Barton Dubois, MD   Multiple Vitamin (MULTIVITAMIN) tablet Take 1 tablet by mouth daily.     Yes Historical Provider, MD  nitroGLYCERIN (NITROSTAT) 0.4 MG SL tablet Place 1 tablet (0.4 mg total) under the tongue every 5 (five) minutes as needed. 08/28/10  Yes Marletta Lor, MD  pantoprazole (PROTONIX) 40 MG tablet Take 1 tablet (40 mg total) by mouth daily. 01/09/14  Yes Barton Dubois, MD  simvastatin (ZOCOR) 20 MG tablet Take 20 mg by mouth at bedtime.   Yes Historical Provider, MD  tamsulosin (FLOMAX) 0.4 MG CAPS capsule Take 0.4 mg by mouth daily after supper.   Yes Historical Provider, MD  traMADol (ULTRAM) 50 MG tablet Take 50 mg by mouth every 6 (six) hours as needed (for pain).   Yes Historical Provider, MD  warfarin (COUMADIN) 5 MG tablet Take 2.5-5 mg by mouth every morning. Take 2.5 mg daily except 5 mg on sundays   Yes Historical Provider, MD  simvastatin (ZOCOR) 20 MG tablet TAKE 1 TABLET BY MOUTH AT BEDTIME 01/17/14   Marletta Lor, MD   Allergies  Allergen Reactions  . Sulfonamide Derivatives     REACTION: n/v    FAMILY HISTORY:  indicated that his mother is deceased. He indicated that his father is deceased.  SOCIAL HISTORY:  reports that he has never smoked. He has never used smokeless tobacco. He reports that he does not drink alcohol or use illicit drugs.  REVIEW OF SYSTEMS:   Lethargic   SUBJECTIVE:   on pressors.  Denies pain Mild resp distress  VITAL SIGNS: Temp:  [97.8 F (36.6 C)-98.7 F (37.1 C)] 97.8 F (36.6 C) (10/25 1100) Pulse Rate:  [25-144] 89 (10/25 1200) Resp:  [17-31] 19 (10/25 1200) BP: (75-118)/(32-81) 81/40 mmHg (10/25 1200) SpO2:  [78 %-100 %] 94 % (10/25 1200) Arterial Line BP: (93-127)/(42-68) 113/53 mmHg (10/25 1200) FiO2 (%):  [40 %-55 %] 40 % (10/25 1200) Weight:  [98.3 kg (216 lb 11.4 oz)] 98.3 kg (216 lb 11.4 oz) (10/25 0600) HEMODYNAMICS: CVP:  [10 mmHg-17 mmHg] 17 mmHg VENTILATOR SETTINGS: Vent Mode:  [-]  FiO2 (%):  [40 %-55 %] 40  % INTAKE / OUTPUT:  Intake/Output Summary (Last 24 hours) at 26-Feb-2014 1310 Last data filed at Feb 26, 2014 1205  Gross per 24 hour  Intake 3114.7 ml  Output   4007 ml  Net -892.3 ml    PHYSICAL EXAMINATION: Gen. Appears chronically ill,, in mild distress, flat affect, on ventimask ENT - no lesions, no post nasal drip Neck: No JVD, no thyromegaly, no carotid bruits Lungs: no use of accessory muscles, no dullness to percussion,decreased with rt basal rales , no rhonchi  Cardiovascular: Rhythm regular, heart sounds  normal, no murmurs, no peripheral edema Abdomen: soft and non-tender, no hepatosplenomegaly, BS normal. Musculoskeletal: No deformities, no cyanosis or clubbing Neuro:  Awake, lethargic, non focal Skin:  Warm, no lesions/ rash   LABS:  CBC  Recent Labs Lab 02/04/14 0429 02/05/14 0419 2014/02/26 0430  WBC 17.5* 21.0* 35.8*  HGB 8.6* 8.6* 8.3*  HCT 27.4* 27.7* 26.3*  PLT 275 338 384   Coag's No results found for this basename: APTT, INR,  in the last 168 hours BMET  Recent Labs Lab 02/05/14 1150 02/05/14 2012 2014-02-26 0430  NA 142 136* 137  K 3.3* 4.0 4.2  CL 106 96 96  CO2 23 28 25   BUN 27* 33* 34*  CREATININE 1.50* 1.86* 1.99*  GLUCOSE  119* 128* 133*   Electrolytes  Recent Labs Lab 02/04/14 0429  02/05/14 0419 02/05/14 1150 02/05/14 2012 03/04/14 0430  CALCIUM 9.4  < > 8.7 6.5* 8.4 8.4  MG 2.1  --  2.3  --   --  2.4  PHOS 2.8  < >  --  2.0* 2.0* 2.3  < > = values in this interval not displayed. Sepsis Markers No results found for this basename: LATICACIDVEN, PROCALCITON, O2SATVEN,  in the last 168 hours ABG  Recent Labs Lab 02/05/14 2351 04-Mar-2014 0425 2014/03/04 0820  PHART 7.466* 7.478* 7.471*  PCO2ART 37.7 38.0 35.1  PO2ART 64.0* 62.0* 60.0*   Liver Enzymes  Recent Labs Lab 02/01/14 0335 02/02/14 0430  02/05/14 0419 02/05/14 1150 02/05/14 2012 Mar 04, 2014 0430  AST 182* 107*  --  58*  --   --   --   ALT 495* 368*  --  154*  --    --   --   ALKPHOS 68 75  --  88  --   --   --   BILITOT 1.0 1.3*  --  2.5*  --   --   --   ALBUMIN 3.1* 3.0*  3.0*  < > 2.9* 2.2* 3.1* 3.2*  < > = values in this interval not displayed. Cardiac Enzymes  Recent Labs Lab 2014/03/04 0430  PROBNP 5061.0*   Glucose  Recent Labs Lab 02/04/14 1558 02/05/14 0802 02/05/14 1122 02/05/14 1532 03-04-2014 0831 March 04, 2014 1132  GLUCAP 72 118* 139* 142* 141* 102*    Imaging Dg Chest Port 1 View  02/05/2014   CLINICAL DATA:  Recent CABG.  Subsequent encounter.  EXAM: PORTABLE CHEST - 1 VIEW  COMPARISON:  02/04/2014  FINDINGS: A feeding tube is been placed and extends down into the stomach. Right IJ line tip: Brachiocephalic confluence. Right subclavian line tip: Right atrium. Dual lead pacer remains in place.  Right basilar chest tube noted. Possible small left pneumothorax medially at the left lung apex. Vertical gas collection projecting centrally over the chest, new from prior is, cannot exclude sternal dehiscence or pneumomediastinum. No sternal wire migration currently.  Obscuration of the left lung base by airspace opacity. Bilateral interstitial opacities are present.  Atrial clip and postoperative findings from prior CABG noted.  IMPRESSION: 1. Suspected small left apical pneumothorax medially. 2. Linear low density projecting centrally over the sternum may represent gas in the sternal wound or pneumomediastinum. I cannot exclude sternal dehiscence, although no sternal wire migration is observed. Correlate with clinical appearance. 3. No right-sided pneumothorax currently. A right-sided chest tube is in place. 4. Low lung volumes with bibasilar (left greater than right) atelectasis and bilateral interstitial edema. 5. Interval placement of a feeding tube.   Electronically Signed   By: Sherryl Barters M.D.   On: 02/05/2014 08:36     ASSESSMENT / PLAN:  PULMONARY OETT  A: Acute hypoxic respiratory failure  HCAP Left pleural effusion    P:   IS o2 as indicated Pulse ox Bipap with possible intubation  CARDIOVASCULAR CVL Right [place] CVL 10/18>>> Right IJ HD cath 10/20>>>  A:  Mixed septic and cardiogenic shock Systolic HF w/ EF 77-82% Intra-op MI Sp 3V CABG AF P:  Hemodynamics and rate control per cards and thoracic surgery   RENAL A:   AKI  P:   CRRT per renal - would keep even while on levophed  GASTROINTESTINAL A:   No acute  P:   NPO   HEMATOLOGIC A:  Anemia  of critical illness w/out evidence of active bleeding  P:  Trend CBC  PAS  INFECTIOUS A:   HCAP  Septic shock  P:   BCx2 10/23>>>ng UC 10/23: neg  Fluconazole 10/23>>> Zosyn 10/23>>> vanc 10/23>>>  ENDOCRINE A:  Hyperglycemia  P:   Trend glucose   NEUROLOGIC A:  Weakness  P:   RASS goal: n/a Supportive care   Family update - 10/24 daughter Nelly Laurence SUMMARY: he has developed worsening bL infiltrates with extreme leucocytosis, presumed HCAP. He may very well need mechanical ventilation if we are to proceed with aggressive care. Cardiology speaking to family about care limitations . I do not think that left thoracentesis would offer much relief.  The patient is critically ill with multiple organ systems failure and requires high complexity decision making for assessment and support, frequent evaluation and titration of therapies, application of advanced monitoring technologies and extensive interpretation of multiple databases.    Care during the described time interval was provided by me and/or other providers on the critical care team.  I have reviewed this patient's available data, including medical history, events of note, physical examination and test results as part of my evaluation  CC time x 71m  ALVA,RAKESH V. MD  2014/02/14, 1:10 PM

## 2014-02-13 NOTE — Progress Notes (Signed)
Advanced Heart Failure Rounding Note   Subjective:     Francisco Merritt is a 80 y.o. male with a history of obesity, hypertension, CAD, paroxysmal atrial fibrillation, systolic HF EF 65-03%, tachybradycardia syndrome and is status post pacemaker  --Underwent R/L cath 10/7.  Severe 2V CAD.  --Underwent CABG and Maze on 10/15. (left internal mammary artery to left anterior  descending, saphenous vein graft to first diagonal and OM1);  --Suffered perioperative MI with trop > 20. Echo with poor images. LVEF "mild to moderately" depressed.  --Developed renal failure with marked volume overload (> 40 pounds)   He is much worse today. Lethargic. Non-communicative. Hypotensive requiring increase in levophed. Co-ox 33%  CT C/A/P reviewed personally. No bowel perf but extensive multilobar PNA.     Objective:   Weight Range:  Vital Signs:   Temp:  [97.8 F (36.6 C)-98.7 F (37.1 C)] 97.8 F (36.6 C) (10/25 1100) Pulse Rate:  [25-144] 89 (10/25 1200) Resp:  [17-31] 19 (10/25 1200) BP: (77-118)/(40-81) 81/40 mmHg (10/25 1200) SpO2:  [78 %-100 %] 94 % (10/25 1200) Arterial Line BP: (93-127)/(42-68) 113/53 mmHg (10/25 1200) FiO2 (%):  [40 %-55 %] 40 % (10/25 1200) Weight:  [98.3 kg (216 lb 11.4 oz)] 98.3 kg (216 lb 11.4 oz) (10/25 0600) Last BM Date: 02/01/14  Weight change: Filed Weights   02/04/14 0500 02/05/14 0600 02-24-2014 0600  Weight: 105.4 kg (232 lb 5.8 oz) 98.6 kg (217 lb 6 oz) 98.3 kg (216 lb 11.4 oz)    Intake/Output:   Intake/Output Summary (Last 24 hours) at 2014-02-24 1403 Last data filed at 24-Feb-2014 1205  Gross per 24 hour  Intake 2824.2 ml  Output   3870 ml  Net -1045.8 ml     Physical Exam: General:  Critically ill non-responsive HEENT: normal + thrush on tongue Neck: supple.RIJ trialysis catheter. . Carotids 2+ bilat; no bruits.  Cor: PMI nondisplaced. RRR.+ sternal dressing.  Lungs: decreased BS anteriorly R chest tube in place Abdomen: soft, nontender,  +distended. No hepatosplenomegaly. No bruits or masses. Good bowel sounds. Extremities: no cyanosis, clubbing, rash, 3+ edema extensive ecchymosis toes mottled Neuro: non-communicative. lethargic  Telemetry:  SR with occasional v-pacing  Labs: Basic Metabolic Panel:  Recent Labs Lab 02/02/14 0430  02/03/14 0400  02/04/14 0429  02/04/14 1552 02/04/14 1647 02/05/14 0419 02/05/14 1150 02/05/14 2012 24-Feb-2014 0430  NA 130*  < > 139  < > 140  < > QUESTIONABLE RESULTS, RECOMMEND RECOLLECT TO VERIFY 140 136* 142 136* 137  K 3.2*  < > 3.2*  < > 4.1  < > QUESTIONABLE RESULTS, RECOMMEND RECOLLECT TO VERIFY 4.3 4.0 3.3* 4.0 4.2  CL 88*  < > 87*  < > 91*  < > QUESTIONABLE RESULTS, RECOMMEND RECOLLECT TO VERIFY 97 94* 106 96 96  CO2 24  < > 38*  < > 38*  < > QUESTIONABLE RESULTS, RECOMMEND RECOLLECT TO VERIFY 33* 29 23 28 25   GLUCOSE 159*  < > 174*  < > 130*  < > QUESTIONABLE RESULTS, RECOMMEND RECOLLECT TO VERIFY 87 137* 119* 128* 133*  BUN 51*  < > 31*  < > 23  < > QUESTIONABLE RESULTS, RECOMMEND RECOLLECT TO VERIFY 23 29* 27* 33* 34*  CREATININE 3.41*  < > 2.23*  < > 1.81*  < > QUESTIONABLE RESULTS, RECOMMEND RECOLLECT TO VERIFY 1.68* 1.81* 1.50* 1.86* 1.99*  CALCIUM 9.1  < > 10.0  < > 9.4  < > QUESTIONABLE RESULTS, RECOMMEND RECOLLECT TO VERIFY  8.7 8.7 6.5* 8.4 8.4  MG 2.4  --  2.0  --  2.1  --   --   --  2.3  --   --  2.4  PHOS 4.5  < > 2.6  < > 2.8  < > QUESTIONABLE RESULTS, RECOMMEND RECOLLECT TO VERIFY 2.9  --  2.0* 2.0* 2.3  < > = values in this interval not displayed.  Liver Function Tests:  Recent Labs Lab 02/01/14 0335 02/02/14 0430  02/04/14 1647 02/05/14 0419 02/05/14 1150 02/05/14 2012 Feb 17, 2014 0430  AST 182* 107*  --   --  58*  --   --   --   ALT 495* 368*  --   --  154*  --   --   --   ALKPHOS 68 75  --   --  88  --   --   --   BILITOT 1.0 1.3*  --   --  2.5*  --   --   --   PROT 5.7* 5.9*  --   --  6.1  --   --   --   ALBUMIN 3.1* 3.0*  3.0*  < > 3.1* 2.9* 2.2*  3.1* 3.2*  < > = values in this interval not displayed. No results found for this basename: LIPASE, AMYLASE,  in the last 168 hours No results found for this basename: AMMONIA,  in the last 168 hours  CBC:  Recent Labs Lab 02/02/14 0430  02/03/14 0400  02/03/14 2201 02/03/14 2223 02/04/14 0429 02/05/14 0419 02-17-2014 0430  WBC 16.5*  --  17.3*  --   --   --  17.5* 21.0* 35.8*  HGB 8.2*  < > 9.3*  < > 10.2* 11.2* 8.6* 8.6* 8.3*  HCT 23.8*  < > 29.4*  < > 30.0* 33.0* 27.4* 27.7* 26.3*  MCV 89.5  --  91.9  --   --   --  95.1 96.2 97.0  PLT 178  --  233  --   --   --  275 338 384  < > = values in this interval not displayed.  Cardiac Enzymes: No results found for this basename: CKTOTAL, CKMB, CKMBINDEX, TROPONINI,  in the last 168 hours  BNP: BNP (last 3 results)  Recent Labs  01/06/14 1305 01/28/2014 0540 02-17-2014 0430  PROBNP 2171.0* 1919.0* 5061.0*     Other results:    Imaging: Dg Chest Port 1 View  2014/02/17   CLINICAL DATA:  80 year old male with congestive heart failure.  EXAM: PORTABLE CHEST - 1 VIEW  COMPARISON:  Chest x-ray 02/05/2014.  FINDINGS: There is a right-sided internal jugular central venous catheter with tip terminating in the superior cavoatrial junction. Small bore feeding tube extends into the stomach, but the tip of the tube extends below the lower margin of the image. Right-sided chest tube with tip and side port projecting over the lower right hemithorax. Status post median sternotomy for CABG and left atrial appendage ligation, with a ligation clip noted. Left-sided pacemaker device in position with lead tips projecting over the expected location of the right atrium and right ventricular apex. The continues to be multiple lucencies projecting over the mediastinum, concerning for pneumomediastinum. There is also lucency in the medial aspect of the left apex, likely to represent a residual trace left pneumothorax (less than 5% of the volume of the left  hemithorax). Unusual lucency at the right lung base adjacent to chest tube may reflect the presence of a partially loculated  subpulmonic pneumothorax. Extensive interstitial and airspace opacities throughout the mid to lower lungs bilaterally. Probable small bilateral pleural effusions. Moderate enlargement of the cardiopericardial silhouette. Upper mediastinal contours remain distorted.  IMPRESSION: 1. Postoperative changes and support apparatus, as above. 2. Small volume of pneumomediastinum, trace at left apical pneumothorax, and probable small right subpulmonic pneumothorax. 3. Significantly worsening aeration throughout the mid to lower lungs bilaterally concerning for worsening multilobar pneumonia, although there is likely some superimposed bibasilar atelectasis. Probable small bilateral pleural effusions.   Electronically Signed   By: Vinnie Langton M.D.   On: 2014-02-21 08:17   Dg Chest Port 1 View  02/05/2014   CLINICAL DATA:  Recent CABG.  Subsequent encounter.  EXAM: PORTABLE CHEST - 1 VIEW  COMPARISON:  02/04/2014  FINDINGS: A feeding tube is been placed and extends down into the stomach. Right IJ line tip: Brachiocephalic confluence. Right subclavian line tip: Right atrium. Dual lead pacer remains in place.  Right basilar chest tube noted. Possible small left pneumothorax medially at the left lung apex. Vertical gas collection projecting centrally over the chest, new from prior is, cannot exclude sternal dehiscence or pneumomediastinum. No sternal wire migration currently.  Obscuration of the left lung base by airspace opacity. Bilateral interstitial opacities are present.  Atrial clip and postoperative findings from prior CABG noted.  IMPRESSION: 1. Suspected small left apical pneumothorax medially. 2. Linear low density projecting centrally over the sternum may represent gas in the sternal wound or pneumomediastinum. I cannot exclude sternal dehiscence, although no sternal wire migration is  observed. Correlate with clinical appearance. 3. No right-sided pneumothorax currently. A right-sided chest tube is in place. 4. Low lung volumes with bibasilar (left greater than right) atelectasis and bilateral interstitial edema. 5. Interval placement of a feeding tube.   Electronically Signed   By: Sherryl Barters M.D.   On: 02/05/2014 08:36     Medications:     Scheduled Medications: . antiseptic oral rinse  7 mL Mouth Rinse BID  . aspirin EC  325 mg Oral Daily   Or  . aspirin  324 mg Per Tube Daily  . bisacodyl  10 mg Oral Daily   Or  . bisacodyl  10 mg Rectal Daily  . docusate  200 mg Per Tube Daily  . feeding supplement (PRO-STAT SUGAR FREE 64)  30 mL Per Tube TID  . fluconazole (DIFLUCAN) IV  800 mg Intravenous Q24H  . insulin aspart  0-9 Units Subcutaneous TID WC  . metoCLOPramide  5 mg Oral TID AC  . nystatin  5 mL Oral QID  . pantoprazole sodium  40 mg Per Tube Daily  . piperacillin-tazobactam  3.375 g Intravenous 4 times per day  . sodium chloride  10-40 mL Intracatheter Q12H  . vancomycin  1,000 mg Intravenous Q24H    Infusions: . amiodarone 30 mg/hr (Feb 21, 2014 1200)  . DOPamine 3 mcg/kg/min (2014-02-21 1200)  . feeding supplement (OSMOLITE 1.5 CAL) 1,000 mL (2014-02-21 0222)  . heparin 1,200 Units/hr (February 21, 2014 1200)  . milrinone 0.375 mcg/kg/min (21-Feb-2014 1200)  . norepinephrine (LEVOPHED) Adult infusion 8 mcg/min (21-Feb-2014 1200)  . dialysis replacement fluid (prismasate) 250 mL/hr at 02/05/14 1212  . dialysis replacement fluid (prismasate) 200 mL/hr at 02/05/14 1212  . dialysate (PRISMASATE) 500 mL/hr at 02/21/2014 0836    PRN Medications: fentaNYL, heparin, levalbuterol, ondansetron (ZOFRAN) IV, oxyCODONE, sodium chloride, sodium chloride, sorbitol, traMADol   Assessment:   1. Coronary artery disease s/p CABG/Maze10/16 2. Perioperative NSTEMI 3. A/c systolic HF with  cardiogenic shock 4. iCM EF 30-35% (preop) 5. [name] now NSR s/p Maze 6. Acute on CKD, stage  IV 7. Acute respiratory failure   Plan/Discussion:    He is actively dying of multi-system organ failure. I do not think there is any chance for meaningful recovery. I have d/w Dr. Roxy Manns and the patient's family.  We will proceed with Comfort Care measures once full family arrives. For now DNR/DNI. Continue pressors as needed.   The patient is critically ill with multiple organ systems failure and requires high complexity decision making for assessment and support, frequent evaluation and titration of therapies, application of advanced monitoring technologies and extensive interpretation of multiple databases.   Critical Care Time personally devoted to patient care services described in this note is 45 Minutes.   Saddie Sandeen,MD 2:03 PM

## 2014-02-13 NOTE — Progress Notes (Signed)
Pt c/o extreme throat and ear pain with eating, drinking, and oral care. Pt noted to be pocketing pills and food after said administration. Pt made NPO and order received for swallow study  Francisco Merritt'

## 2014-02-13 NOTE — Progress Notes (Signed)
73ml Versed wasted and 57ml morphine wasted down sink. Witnessed by Vickii Penna RN

## 2014-02-13 DEATH — deceased

## 2014-03-07 ENCOUNTER — Encounter: Payer: Medicare HMO | Admitting: Internal Medicine
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
## 2014-03-24 ENCOUNTER — Encounter (HOSPITAL_COMMUNITY): Payer: Self-pay | Admitting: Internal Medicine

## 2014-04-28 ENCOUNTER — Encounter (HOSPITAL_COMMUNITY): Payer: Self-pay | Admitting: Thoracic Surgery (Cardiothoracic Vascular Surgery)
